# Patient Record
Sex: Female | Born: 1955 | Race: White | Hispanic: No | Marital: Single | Smoking: Current every day smoker
Health system: Southern US, Community
[De-identification: ages and names within clinical notes are randomized; demographics above are authoritative.]

## PROBLEM LIST (undated history)

## (undated) DIAGNOSIS — M71311 Other bursal cyst, right shoulder: Secondary | ICD-10-CM

## (undated) DIAGNOSIS — R319 Hematuria, unspecified: Secondary | ICD-10-CM

## (undated) DIAGNOSIS — N200 Calculus of kidney: Secondary | ICD-10-CM

## (undated) DIAGNOSIS — K635 Polyp of colon: Secondary | ICD-10-CM

## (undated) DIAGNOSIS — K759 Inflammatory liver disease, unspecified: Secondary | ICD-10-CM

## (undated) DIAGNOSIS — J45909 Unspecified asthma, uncomplicated: Secondary | ICD-10-CM

## (undated) DIAGNOSIS — E039 Hypothyroidism, unspecified: Secondary | ICD-10-CM

## (undated) DIAGNOSIS — IMO0002 Reserved for concepts with insufficient information to code with codable children: Secondary | ICD-10-CM

## (undated) DIAGNOSIS — K219 Gastro-esophageal reflux disease without esophagitis: Secondary | ICD-10-CM

## (undated) DIAGNOSIS — F419 Anxiety disorder, unspecified: Secondary | ICD-10-CM

## (undated) DIAGNOSIS — Z87442 Personal history of urinary calculi: Secondary | ICD-10-CM

## (undated) DIAGNOSIS — M1990 Unspecified osteoarthritis, unspecified site: Secondary | ICD-10-CM

## (undated) DIAGNOSIS — F32A Depression, unspecified: Secondary | ICD-10-CM

## (undated) DIAGNOSIS — G2581 Restless legs syndrome: Secondary | ICD-10-CM

## (undated) DIAGNOSIS — R51 Headache: Secondary | ICD-10-CM

## (undated) DIAGNOSIS — M109 Gout, unspecified: Secondary | ICD-10-CM

## (undated) DIAGNOSIS — F329 Major depressive disorder, single episode, unspecified: Secondary | ICD-10-CM

## (undated) DIAGNOSIS — R7611 Nonspecific reaction to tuberculin skin test without active tuberculosis: Secondary | ICD-10-CM

## (undated) DIAGNOSIS — J189 Pneumonia, unspecified organism: Secondary | ICD-10-CM

## (undated) DIAGNOSIS — E7800 Pure hypercholesterolemia, unspecified: Secondary | ICD-10-CM

## (undated) DIAGNOSIS — J449 Chronic obstructive pulmonary disease, unspecified: Secondary | ICD-10-CM

## (undated) DIAGNOSIS — K922 Gastrointestinal hemorrhage, unspecified: Secondary | ICD-10-CM

## (undated) DIAGNOSIS — M719 Bursopathy, unspecified: Secondary | ICD-10-CM

## (undated) DIAGNOSIS — K5792 Diverticulitis of intestine, part unspecified, without perforation or abscess without bleeding: Secondary | ICD-10-CM

## (undated) DIAGNOSIS — R0600 Dyspnea, unspecified: Secondary | ICD-10-CM

## (undated) HISTORY — DX: Other bursal cyst, right shoulder: M71.311

## (undated) HISTORY — DX: Restless legs syndrome: G25.81

## (undated) HISTORY — DX: Gout, unspecified: M10.9

## (undated) HISTORY — DX: Diverticulitis of intestine, part unspecified, without perforation or abscess without bleeding: K57.92

## (undated) HISTORY — PX: BREAST LUMPECTOMY: SHX2

## (undated) HISTORY — DX: Headache: R51

## (undated) HISTORY — PX: OTHER SURGICAL HISTORY: SHX169

## (undated) HISTORY — DX: Pure hypercholesterolemia, unspecified: E78.00

## (undated) HISTORY — DX: Hypothyroidism, unspecified: E03.9

## (undated) HISTORY — PX: TONSILLECTOMY: SUR1361

## (undated) HISTORY — DX: Nonspecific reaction to tuberculin skin test without active tuberculosis: R76.11

## (undated) HISTORY — PX: TUBAL LIGATION: SHX77

## (undated) HISTORY — DX: Polyp of colon: K63.5

## (undated) HISTORY — DX: Calculus of kidney: N20.0

## (undated) HISTORY — DX: Bursopathy, unspecified: M71.9

## (undated) HISTORY — DX: Gastro-esophageal reflux disease without esophagitis: K21.9

## (undated) HISTORY — DX: Reserved for concepts with insufficient information to code with codable children: IMO0002

---
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
## 2004-01-05 ENCOUNTER — Other Ambulatory Visit: Admission: RE | Admit: 2004-01-05 | Discharge: 2004-01-05 | Payer: Self-pay | Admitting: Family Medicine

## 2010-02-07 ENCOUNTER — Encounter: Admission: RE | Admit: 2010-02-07 | Discharge: 2010-02-07 | Payer: Self-pay | Admitting: Orthopedic Surgery

## 2010-05-05 ENCOUNTER — Ambulatory Visit (HOSPITAL_COMMUNITY): Admission: RE | Admit: 2010-05-05 | Discharge: 2010-05-05 | Payer: Self-pay | Admitting: Gastroenterology

## 2010-05-30 ENCOUNTER — Other Ambulatory Visit: Admission: RE | Admit: 2010-05-30 | Discharge: 2010-05-30 | Payer: Self-pay | Admitting: Obstetrics and Gynecology

## 2012-11-27 ENCOUNTER — Telehealth: Payer: Self-pay | Admitting: Family Medicine

## 2012-12-10 NOTE — Telephone Encounter (Signed)
Opened in error

## 2014-04-05 ENCOUNTER — Inpatient Hospital Stay (HOSPITAL_COMMUNITY)
Admission: EM | Admit: 2014-04-05 | Discharge: 2014-04-06 | DRG: 149 | Disposition: A | Discharge status: Home / Self Care | Payer: No Typology Code available for payment source | Attending: Internal Medicine | Admitting: Internal Medicine

## 2014-04-05 ENCOUNTER — Encounter (HOSPITAL_COMMUNITY): Payer: Self-pay | Admitting: Emergency Medicine

## 2014-04-05 ENCOUNTER — Emergency Department (HOSPITAL_COMMUNITY): Payer: No Typology Code available for payment source

## 2014-04-05 DIAGNOSIS — R319 Hematuria, unspecified: Secondary | ICD-10-CM | POA: Diagnosis present

## 2014-04-05 DIAGNOSIS — F411 Generalized anxiety disorder: Secondary | ICD-10-CM | POA: Diagnosis present

## 2014-04-05 DIAGNOSIS — H8303 Labyrinthitis, bilateral: Secondary | ICD-10-CM | POA: Diagnosis present

## 2014-04-05 DIAGNOSIS — I10 Essential (primary) hypertension: Secondary | ICD-10-CM | POA: Diagnosis present

## 2014-04-05 DIAGNOSIS — E785 Hyperlipidemia, unspecified: Secondary | ICD-10-CM | POA: Diagnosis present

## 2014-04-05 DIAGNOSIS — F329 Major depressive disorder, single episode, unspecified: Secondary | ICD-10-CM

## 2014-04-05 DIAGNOSIS — H8309 Labyrinthitis, unspecified ear: Principal | ICD-10-CM | POA: Diagnosis present

## 2014-04-05 DIAGNOSIS — H9319 Tinnitus, unspecified ear: Secondary | ICD-10-CM | POA: Diagnosis present

## 2014-04-05 DIAGNOSIS — F419 Anxiety disorder, unspecified: Secondary | ICD-10-CM | POA: Diagnosis present

## 2014-04-05 DIAGNOSIS — G43909 Migraine, unspecified, not intractable, without status migrainosus: Secondary | ICD-10-CM | POA: Diagnosis present

## 2014-04-05 DIAGNOSIS — F3289 Other specified depressive episodes: Secondary | ICD-10-CM | POA: Diagnosis present

## 2014-04-05 DIAGNOSIS — R42 Dizziness and giddiness: Secondary | ICD-10-CM | POA: Diagnosis present

## 2014-04-05 DIAGNOSIS — F17200 Nicotine dependence, unspecified, uncomplicated: Secondary | ICD-10-CM | POA: Diagnosis present

## 2014-04-05 DIAGNOSIS — Z860100 Personal history of colon polyps, unspecified: Secondary | ICD-10-CM

## 2014-04-05 DIAGNOSIS — Z87442 Personal history of urinary calculi: Secondary | ICD-10-CM | POA: Diagnosis present

## 2014-04-05 DIAGNOSIS — F32A Depression, unspecified: Secondary | ICD-10-CM | POA: Diagnosis present

## 2014-04-05 HISTORY — DX: Hematuria, unspecified: R31.9

## 2014-04-05 HISTORY — DX: Calculus of kidney: N20.0

## 2014-04-05 HISTORY — DX: Unspecified osteoarthritis, unspecified site: M19.90

## 2014-04-05 LAB — RAPID URINE DRUG SCREEN, HOSP PERFORMED
Amphetamines: NOT DETECTED
BENZODIAZEPINES: NOT DETECTED
Barbiturates: NOT DETECTED
Cocaine: NOT DETECTED
OPIATES: NOT DETECTED
TETRAHYDROCANNABINOL: NOT DETECTED

## 2014-04-05 LAB — URINALYSIS, ROUTINE W REFLEX MICROSCOPIC
BILIRUBIN URINE: NEGATIVE
Glucose, UA: NEGATIVE mg/dL
Ketones, ur: 40 mg/dL — AB
Leukocytes, UA: NEGATIVE
Nitrite: NEGATIVE
Protein, ur: NEGATIVE mg/dL
SPECIFIC GRAVITY, URINE: 1.011 (ref 1.005–1.030)
Urobilinogen, UA: 0.2 mg/dL (ref 0.0–1.0)
pH: 7.5 (ref 5.0–8.0)

## 2014-04-05 LAB — CBC WITH DIFFERENTIAL/PLATELET
Basophils Absolute: 0 10*3/uL (ref 0.0–0.1)
Basophils Relative: 0 % (ref 0–1)
EOS ABS: 0 10*3/uL (ref 0.0–0.7)
Eosinophils Relative: 0 % (ref 0–5)
HEMATOCRIT: 48.8 % — AB (ref 36.0–46.0)
Hemoglobin: 16.9 g/dL — ABNORMAL HIGH (ref 12.0–15.0)
LYMPHS ABS: 1.8 10*3/uL (ref 0.7–4.0)
LYMPHS PCT: 16 % (ref 12–46)
MCH: 33.3 pg (ref 26.0–34.0)
MCHC: 34.6 g/dL (ref 30.0–36.0)
MCV: 96.1 fL (ref 78.0–100.0)
MONOS PCT: 3 % (ref 3–12)
Monocytes Absolute: 0.4 10*3/uL (ref 0.1–1.0)
NEUTROS PCT: 81 % — AB (ref 43–77)
Neutro Abs: 9.5 10*3/uL — ABNORMAL HIGH (ref 1.7–7.7)
PLATELETS: 254 10*3/uL (ref 150–400)
RBC: 5.08 MIL/uL (ref 3.87–5.11)
RDW: 12.8 % (ref 11.5–15.5)
WBC: 11.7 10*3/uL — ABNORMAL HIGH (ref 4.0–10.5)

## 2014-04-05 LAB — BASIC METABOLIC PANEL
BUN: 10 mg/dL (ref 6–23)
CALCIUM: 9.9 mg/dL (ref 8.4–10.5)
CHLORIDE: 103 meq/L (ref 96–112)
CO2: 17 meq/L — AB (ref 19–32)
CREATININE: 0.69 mg/dL (ref 0.50–1.10)
GFR calc Af Amer: >90 mL/min (ref >90)
Glucose, Bld: 131 mg/dL — ABNORMAL HIGH (ref 70–99)
POTASSIUM: 3.9 meq/L (ref 3.7–5.3)
SODIUM: 142 meq/L (ref 137–147)

## 2014-04-05 LAB — URINE MICROSCOPIC-ADD ON

## 2014-04-05 LAB — CBG MONITORING, ED: Glucose-Capillary: 115 mg/dL — ABNORMAL HIGH (ref 70–99)

## 2014-04-05 LAB — ETHANOL

## 2014-04-05 MED ORDER — SODIUM CHLORIDE 0.9 % IV BOLUS (SEPSIS)
1000.0000 mL | Freq: Once | INTRAVENOUS | Status: AC
Start: 2014-04-05 — End: 2014-04-05
  Administered 2014-04-05: 1000 mL via INTRAVENOUS

## 2014-04-05 MED ORDER — PREDNISONE 20 MG PO TABS
60.0000 mg | ORAL_TABLET | Freq: Once | ORAL | Status: AC
  Administered 2014-04-05: 60 mg via ORAL
  Filled 2014-04-05: qty 3

## 2014-04-05 MED ORDER — DIAZEPAM 5 MG/ML IJ SOLN
5.0000 mg | Freq: Once | INTRAMUSCULAR | Status: AC
  Administered 2014-04-05: 5 mg via INTRAVENOUS
  Filled 2014-04-05: qty 2

## 2014-04-05 MED ORDER — ONDANSETRON HCL 4 MG/2ML IJ SOLN
4.0000 mg | Freq: Once | INTRAMUSCULAR | Status: AC
  Administered 2014-04-05: 4 mg via INTRAVENOUS
  Filled 2014-04-05: qty 2

## 2014-04-05 MED ORDER — MECLIZINE HCL 25 MG PO TABS
25.0000 mg | ORAL_TABLET | Freq: Once | ORAL | Status: AC
  Administered 2014-04-05: 25 mg via ORAL
  Filled 2014-04-05: qty 1

## 2014-04-05 NOTE — ED Notes (Signed)
Pt states that she is unable to provide urine at this time.

## 2014-04-05 NOTE — ED Notes (Signed)
Pt reports vertigo, N/V and tingling in both hands that started at 0100 last night

## 2014-04-05 NOTE — ED Provider Notes (Signed)
CSN: [id number]     Arrival date & time 04/05/14  1659 History   First MD Initiated Contact with Patient 04/05/14 1714     Chief Complaint  Patient presents with  . Dizziness  . Tingling     (Consider location/radiation/quality/duration/timing/severity/associated sxs/prior Treatment) HPI 58 year old female presents with acute dizziness/vertigo that started around midnight or 1 AM (17 hours ago). She first noticed it when she got up to go the bathroom she is having a hard time walking and felt dizzy. She ended up laying in the bathroom because she was hard time getting back up. She states the dizziness is hard to describe and is a constant feeling. It is worse with turning her head either direction, sitting up, or standing up. It is better with laying flat/still but does not go away. Denies any headaches. Denies a focal weakness. She states while she's in the waiting room she did feel bilateral hand tingling and numbness that has resolved. Denies a fevers or chills. She had vertigo once before about 30 years ago but has not had any issues since. She has been having bilateral tinnitus since this started.  Past Medical History  Diagnosis Date  . Arthritis   . Hematuria   . Renal stones    Past Surgical History  Procedure Laterality Date  . Tonsillectomy     Family History  Problem Relation Age of Onset  . Cancer Mother   . Cancer Father    History  Substance Use Topics  . Smoking status: Current Every Day Smoker -- 1.00 packs/day for 35 years  . Smokeless tobacco: Not on file  . Alcohol Use: 7.2 oz/week    12 Glasses of wine per week     Comment: per week   OB History   Grav Para Term Preterm Abortions TAB SAB Ect Mult Living                 Review of Systems  Constitutional: Negative for fever.  HENT: Positive for tinnitus.   Cardiovascular: Negative for chest pain.  Gastrointestinal: Positive for nausea and vomiting. Negative for abdominal pain.  Genitourinary: Positive  for hematuria (chronic hematuria). Negative for dysuria.  Neurological: Positive for dizziness and numbness (resolved). Negative for weakness and headaches.  All other systems reviewed and are negative.     Allergies  Advair diskus and Codeine  Home Medications   Prior to Admission medications   Medication Sig Start Date End Date Taking? Authorizing Provider  ALPRAZolam (XANAX) 0.25 MG tablet Take 0.25 mg by mouth 2 (two) times daily as needed for anxiety.   Yes Historical Provider, MD  atorvastatin (LIPITOR) 10 MG tablet Take 10 mg by mouth daily at 6 PM.   Yes Historical Provider, MD  escitalopram (LEXAPRO) 20 MG tablet Take 20 mg by mouth daily.   Yes Historical Provider, MD  omeprazole (PRILOSEC) 40 MG capsule Take 40 mg by mouth at bedtime.   Yes Historical Provider, MD  oxybutynin (DITROPAN-XL) 5 MG 24 hr tablet Take 10 mg by mouth at bedtime.   Yes Historical Provider, MD   BP 144/91  Pulse 97  Resp 26  SpO2 99% Physical Exam  Vitals reviewed. Constitutional: She is oriented to person, place, and time. She appears well-developed and well-nourished.  HENT:  Head: Normocephalic and atraumatic.  Right Ear: External ear normal.  Left Ear: External ear normal.  Nose: Nose normal.  Eyes: Pupils are equal, round, and reactive to light. Right eye exhibits no discharge. Left  eye exhibits no discharge. Right eye exhibits nystagmus. Left eye exhibits nystagmus.  Eyes able to move in all directions but with some mild nystagmus  Cardiovascular: Normal rate, regular rhythm and normal heart sounds.   Pulmonary/Chest: Effort normal and breath sounds normal.  Abdominal: Soft. There is no tenderness.  Neurological: She is alert and oriented to person, place, and time.  CN 2-12 grossly intact. 5/5 strength in all 4 extremities. Able to do finger to nose but with some shaking on the left side with more difficulty but is able to get from finger to nose. Normal heel to shin on right, slight  difficulty and shaking with left  Skin: Skin is warm and dry.    ED Course  Procedures (including critical care time) Labs Review Labs Reviewed  CBC WITH DIFFERENTIAL - Abnormal; Notable for the following:    WBC 11.7 (*)    Hemoglobin 16.9 (*)    HCT 48.8 (*)    Neutrophils Relative % 81 (*)    Neutro Abs 9.5 (*)    All other components within normal limits  BASIC METABOLIC PANEL - Abnormal; Notable for the following:    CO2 17 (*)    Glucose, Bld 131 (*)    All other components within normal limits  URINALYSIS, ROUTINE W REFLEX MICROSCOPIC - Abnormal; Notable for the following:    Hgb urine dipstick MODERATE (*)    Ketones, ur 40 (*)    All other components within normal limits  CBG MONITORING, ED - Abnormal; Notable for the following:    Glucose-Capillary 115 (*)    All other components within normal limits  URINE MICROSCOPIC-ADD ON    Imaging Review Mr Brain Wo Contrast  04/05/2014   CLINICAL DATA:  New onset of vertigo, nausea and vomiting. Bilateral tingling in hands with difficulty ambulating.  EXAM: MRI HEAD WITHOUT CONTRAST  TECHNIQUE: Multiplanar, multiecho pulse sequences of the brain and surrounding structures were obtained without intravenous contrast.  COMPARISON:  None.  FINDINGS: No acute infarct.  No intracranial hemorrhage.  No hydrocephalus.  No intracranial mass lesion noted on this unenhanced exam.  Tortuous vertebral arteries and basilar artery with basilar artery cause mild impression upon the hypothalamic region. Major intracranial vascular structures are patent.  Minimal opacification lateral aspect left sphenoid sinus.  Mild exophthalmos.  IMPRESSION: No acute infarct.  Please see above.   Electronically Signed   By: Chauncey Cruel M.D.   On: 04/05/2014 19:57     EKG Interpretation   Date/Time:  Sunday Apr 05 2014 17:07:15 EDT Ventricular Rate:  86 PR Interval:  132 QRS Duration: 100 QT Interval:  424 QTC Calculation: 507 R Axis:   39 Text  Interpretation:  Sinus rhythm `no acute st/t changes No previous  tracing Confirmed by Ashok Cordia  MD, Lennette Bihari ([id number]) on 04/05/2014 5:09:18 PM      MDM   Final diagnoses:  Vertigo    Patient's MRI is negative for acute stroke. She was given meclizine and Valium and is still having perpetual symptoms. She is unable to stand up due to the dizziness. I feel she is not safe to go home. I counseled Dr. Leonel Ramsay, of neurology, who recommends prednisone taper as this sounds like labyrinthitis with her ear ringing and no evidence of stroke. He is recommending 60 mg prednisone for 5 days, then tapering with 40, 30, 20, 10, 5 for 1 day each. Will admit to hospitalist.    Ephraim Hamburger, MD 04/05/14 2213

## 2014-04-05 NOTE — H&P (Signed)
[facility] History and Physical  [name] MWN:[id number] DOB: 01-24-1956 DOA: 04/05/2014  Referring physician:PCP: No primary provider on file.  Specialists:   Chief Complaint: Vertigo  HPI: Andrea Kline is a 58 y.o. WF PMHx anxiety, depression, HLD, chronic hematuria, renal stones, colon polyps.   Presents with acute dizziness/vertigo that started around midnight or 1 AM (17 hours ago). She first noticed it when she got up to go the bathroom she is having a hard time walking and felt dizzy (room spinning). Positive nausea/vomiting, positive diaphoresis, negative recent illness. Positive tinnitus Rt> Lt. She ended up laying in the bathroom because she was hard time getting back up. She states the dizziness is hard to describe and is a constant feeling. It is worse with turning her head either direction, sitting up, or standing up. It is better with laying flat/still but does not go away. Positive headaches. Denies a focal weakness. She states while she's in the waiting room she did feel bilateral hand tingling and numbness that has resolved. Denies a fevers or chills. She had vertigo once before about 30 years ago but has not had any issues since. She has been having bilateral tinnitus since this started.   Review of Systems: The patient denies anorexia, fever, weight loss,, vision loss, hoarseness, chest pain, syncope, dyspnea on exertion, peripheral edema, hemoptysis, abdominal pain, melena, hematochezia, incontinence, genital sores, muscle weakness, suspicious skin lesions, transient blindness, usual weight change, abnormal bleeding, enlarged lymph nodes, angioedema, and breast masses.    TRAVEL HISTORY: N/A    Consultants:  Dr. Leonel Ramsay (neurology), ED physician phone consult    Procedure/Significant Events:  5/30 MRI brain without contrast; no acute infarct    Culture  5/31 urine pending 5/31 blood pending   Antibiotics:  NA    DVT prophylaxis:   Lovenox  Devices  NA    LINES / TUBES:  5/31 22ga left antecubital   Past Medical History  Diagnosis Date  . Arthritis   . Hematuria   . Renal stones    Past Surgical History  Procedure Laterality Date  . Tonsillectomy     Social History:  1 PPD x35 years. She does not have any smokeless tobacco history on file. She reports that she drinks about 7.2 ounces of alcohol per week. She reports that she does not use illicit drugs since 4034V. where does patient live--home, ALF, SNF? Lives alone  Can patient participate in ADLs?  Allergies  Allergen Reactions  . Advair Diskus [Fluticasone-Salmeterol]     Can't breath/coughing  . Codeine     dizzy    Family History  Problem Relation Age of Onset  . Cancer Mother   . Cancer Father      Prior to Admission medications   Medication Sig Start Date End Date Taking? Authorizing Provider  ALPRAZolam (XANAX) 0.25 MG tablet Take 0.25 mg by mouth 2 (two) times daily as needed for anxiety.   Yes Historical Provider, MD  atorvastatin (LIPITOR) 10 MG tablet Take 10 mg by mouth daily at 6 PM.   Yes Historical Provider, MD  escitalopram (LEXAPRO) 20 MG tablet Take 20 mg by mouth daily.   Yes Historical Provider, MD  omeprazole (PRILOSEC) 40 MG capsule Take 40 mg by mouth at bedtime.   Yes Historical Provider, MD  oxybutynin (DITROPAN-XL) 5 MG 24 hr tablet Take 10 mg by mouth at bedtime.   Yes Historical Provider, MD   Physical Exam: Filed Vitals:   04/05/14 2200 04/05/14 2203  04/05/14 2323 04/05/14 2334  BP: 143/83 143/83 175/101 145/87  Pulse: 85 95 90   Temp:   98.2 F (36.8 C)   TempSrc:   Oral   Resp:  16 22   Height:   5\' 5"  (1.651 m)   Weight:   84.188 kg (185 lb 9.6 oz)   SpO2: 90% 98% 97%      General: A./O. x4, moderate distress secondary to vertigo  Eyes: PERRLA, bilateral Xanthelasma present   Neck: Negative lymphadenopathy negative JVD  Cardiovascular: Regular rhythm and rate, negative murmurs rubs or  gallops  Respiratory: Clear to auscultation bilateral  Abdomen: Soft, nontender, nondistended, plus bowel sound  Skin: Negative laceration  Musculoskeletal: Negative pedal edema  Psychiatric: Anxious  Neurologic: Cranial nerves II through XII intact, negative nystagmus, tongue/uvula midline, extremity strength 5/5, sensation intact throughout, finger-nose-finger within normal limits bilateral, quick finger touch is within normal limit bilateral, did not attempt to ambulate patient as vertigo significantly increased with sitting   Labs on Admission:  Basic Metabolic Panel:  Recent Labs Lab 04/05/14 1745  NA 142  K 3.9  CL 103  CO2 17*  GLUCOSE 131*  BUN 10  CREATININE 0.69  CALCIUM 9.9   Liver Function Tests: No results found for this basename: AST, ALT, ALKPHOS, BILITOT, PROT, ALBUMIN,  in the last 168 hours No results found for this basename: LIPASE, AMYLASE,  in the last 168 hours No results found for this basename: AMMONIA,  in the last 168 hours CBC:  Recent Labs Lab 04/05/14 1745  WBC 11.7*  NEUTROABS 9.5*  HGB 16.9*  HCT 48.8*  MCV 96.1  PLT 254   Cardiac Enzymes: No results found for this basename: CKTOTAL, CKMB, CKMBINDEX, TROPONINI,  in the last 168 hours  BNP (last 3 results) No results found for this basename: PROBNP,  in the last 8760 hours CBG:  Recent Labs Lab 04/05/14 1742  GLUCAP 115*    Radiological Exams on Admission: Mr Brain Wo Contrast  04/05/2014   CLINICAL DATA:  New onset of vertigo, nausea and vomiting. Bilateral tingling in hands with difficulty ambulating.  EXAM: MRI HEAD WITHOUT CONTRAST  TECHNIQUE: Multiplanar, multiecho pulse sequences of the brain and surrounding structures were obtained without intravenous contrast.  COMPARISON:  None.  FINDINGS: No acute infarct.  No intracranial hemorrhage.  No hydrocephalus.  No intracranial mass lesion noted on this unenhanced exam.  Tortuous vertebral arteries and basilar artery with  basilar artery cause mild impression upon the hypothalamic region. Major intracranial vascular structures are patent.  Minimal opacification lateral aspect left sphenoid sinus.  Mild exophthalmos.  IMPRESSION: No acute infarct.  Please see above.   Electronically Signed   By: Chauncey Cruel M.D.   On: 04/05/2014 19:57    EKG: NSR  Assessment/Plan Principal Problem:   Labyrinthitis of both ears Active Problems:   Vertigo   Anxiety   Depression   HLD (hyperlipidemia)   Hematuria   History of nephrolithiasis   History of colon polyps   Migraine, unspecified, without mention of intractable migraine without mention of status migrainosus   Labyrinthitis -Negative MRI -Dr. Sherwood Gambler (ED physician) spoke with Dr. Leonel Ramsay (neurologist) over the phone who believes the patient has labyrinthitis and should be started on the following treatment regimen; 60 mg daily on days one through five, 40 mg on day six, 30 mg on day seven, 20 mg on day eight, 10 mg on day nine, and 5 mg on day 10. -Dr. Leonel Ramsay stated  if formal consult required, contact him in the a.m.  -Continue Zofran when necessary for nausea  Migraine -Patient may also have atypical presentation of migraine which can present with severe vertigo. Patient currently has headache will treat with Imitrex 6 mg injection x1  Anxiety -Continue home dose Xanax 0.25 mg BID PRN   Depression -Continue home dose Lexapro 20 mg daily  HLD -Patient with Xanthelasma present obtain lipid panel -Continue Lipitor 10 mg daily  Hematuria -Chronic no workup required   Code Status: Full Family Communication: None Disposition Plan: Resolution vertigo  Time spent: 90 minutes  [facility] Pager [phone]  If 7PM-7AM, please contact night-coverage [email or web address] Password [email or web address] 04/06/2014, 1:05 AM

## 2014-04-06 DIAGNOSIS — H8303 Labyrinthitis, bilateral: Secondary | ICD-10-CM | POA: Diagnosis present

## 2014-04-06 DIAGNOSIS — G43909 Migraine, unspecified, not intractable, without status migrainosus: Secondary | ICD-10-CM | POA: Diagnosis present

## 2014-04-06 LAB — LIPID PANEL
Cholesterol: 172 mg/dL (ref 0–200)
HDL: 38 mg/dL — ABNORMAL LOW (ref >39)
LDL CALC: 108 mg/dL — AB (ref 0–99)
TRIGLYCERIDES: 132 mg/dL (ref <150)
Total CHOL/HDL Ratio: 4.5 RATIO
VLDL: 26 mg/dL (ref 0–40)

## 2014-04-06 LAB — COMPREHENSIVE METABOLIC PANEL
ALT: 15 U/L (ref 0–35)
AST: 13 U/L (ref 0–37)
Albumin: 3.6 g/dL (ref 3.5–5.2)
Alkaline Phosphatase: 147 U/L — ABNORMAL HIGH (ref 39–117)
BUN: 8 mg/dL (ref 6–23)
CALCIUM: 9.4 mg/dL (ref 8.4–10.5)
CO2: 23 mEq/L (ref 19–32)
CREATININE: 0.69 mg/dL (ref 0.50–1.10)
Chloride: 107 mEq/L (ref 96–112)
GLUCOSE: 133 mg/dL — AB (ref 70–99)
Potassium: 4.2 mEq/L (ref 3.7–5.3)
Sodium: 142 mEq/L (ref 137–147)
Total Bilirubin: 0.3 mg/dL (ref 0.3–1.2)
Total Protein: 6.8 g/dL (ref 6.0–8.3)

## 2014-04-06 LAB — URINALYSIS, ROUTINE W REFLEX MICROSCOPIC
BILIRUBIN URINE: NEGATIVE
Glucose, UA: NEGATIVE mg/dL
KETONES UR: NEGATIVE mg/dL
Leukocytes, UA: NEGATIVE
NITRITE: NEGATIVE
PROTEIN: NEGATIVE mg/dL
Specific Gravity, Urine: 1.005 (ref 1.005–1.030)
UROBILINOGEN UA: 0.2 mg/dL (ref 0.0–1.0)
pH: 6 (ref 5.0–8.0)

## 2014-04-06 LAB — CBC WITH DIFFERENTIAL/PLATELET
Basophils Absolute: 0 10*3/uL (ref 0.0–0.1)
Basophils Relative: 0 % (ref 0–1)
Eosinophils Absolute: 0 10*3/uL (ref 0.0–0.7)
Eosinophils Relative: 0 % (ref 0–5)
HCT: 46.3 % — ABNORMAL HIGH (ref 36.0–46.0)
Hemoglobin: 15.5 g/dL — ABNORMAL HIGH (ref 12.0–15.0)
LYMPHS ABS: 0.8 10*3/uL (ref 0.7–4.0)
LYMPHS PCT: 11 % — AB (ref 12–46)
MCH: 33 pg (ref 26.0–34.0)
MCHC: 33.5 g/dL (ref 30.0–36.0)
MCV: 98.5 fL (ref 78.0–100.0)
Monocytes Absolute: 0.1 10*3/uL (ref 0.1–1.0)
Monocytes Relative: 1 % — ABNORMAL LOW (ref 3–12)
NEUTROS PCT: 88 % — AB (ref 43–77)
Neutro Abs: 6.5 10*3/uL (ref 1.7–7.7)
PLATELETS: 233 10*3/uL (ref 150–400)
RBC: 4.7 MIL/uL (ref 3.87–5.11)
RDW: 12.9 % (ref 11.5–15.5)
WBC: 7.4 10*3/uL (ref 4.0–10.5)

## 2014-04-06 LAB — MAGNESIUM: Magnesium: 2.2 mg/dL (ref 1.5–2.5)

## 2014-04-06 LAB — TSH: TSH: 1.19 u[IU]/mL (ref 0.350–4.500)

## 2014-04-06 LAB — URINE MICROSCOPIC-ADD ON

## 2014-04-06 MED ORDER — ESCITALOPRAM OXALATE 20 MG PO TABS
20.0000 mg | ORAL_TABLET | Freq: Every day | ORAL | Status: DC
  Administered 2014-04-06: 20 mg via ORAL
  Filled 2014-04-06: qty 1

## 2014-04-06 MED ORDER — PANTOPRAZOLE SODIUM 40 MG PO TBEC
80.0000 mg | DELAYED_RELEASE_TABLET | Freq: Every day | ORAL | Status: DC
  Administered 2014-04-06: 80 mg via ORAL
  Filled 2014-04-06: qty 2

## 2014-04-06 MED ORDER — PREDNISONE 50 MG PO TABS
60.0000 mg | ORAL_TABLET | Freq: Every day | ORAL | Status: DC
  Administered 2014-04-06: 60 mg via ORAL
  Filled 2014-04-06: qty 1

## 2014-04-06 MED ORDER — ONDANSETRON HCL 4 MG PO TABS: 4.0000 mg | ORAL_TABLET | Freq: Four times a day (QID) | ORAL | Status: DC | PRN

## 2014-04-06 MED ORDER — ATORVASTATIN CALCIUM 10 MG PO TABS
10.0000 mg | ORAL_TABLET | Freq: Every day | ORAL | Status: DC
  Administered 2014-04-06: 10 mg via ORAL
  Filled 2014-04-06: qty 1

## 2014-04-06 MED ORDER — ALPRAZOLAM 0.25 MG PO TABS: 0.2500 mg | ORAL_TABLET | Freq: Two times a day (BID) | ORAL | Status: DC | PRN

## 2014-04-06 MED ORDER — ACETAMINOPHEN 650 MG RE SUPP: 650.0000 mg | Freq: Four times a day (QID) | RECTAL | Status: DC | PRN

## 2014-04-06 MED ORDER — ONDANSETRON HCL 4 MG/2ML IJ SOLN: 4.0000 mg | Freq: Four times a day (QID) | INTRAMUSCULAR | Status: DC | PRN

## 2014-04-06 MED ORDER — SUMATRIPTAN SUCCINATE 6 MG/0.5ML ~~LOC~~ SOLN
6.0000 mg | Freq: Once | SUBCUTANEOUS | Status: AC
  Administered 2014-04-06: 6 mg via SUBCUTANEOUS
  Filled 2014-04-06: qty 0.5

## 2014-04-06 MED ORDER — ACETAMINOPHEN 325 MG PO TABS: 650.0000 mg | ORAL_TABLET | Freq: Four times a day (QID) | ORAL | Status: DC | PRN

## 2014-04-06 MED ORDER — ENSURE COMPLETE PO LIQD
237.0000 mL | Freq: Two times a day (BID) | ORAL | Status: DC
  Administered 2014-04-06: 237 mL via ORAL

## 2014-04-06 MED ORDER — SODIUM CHLORIDE 0.9 % IV SOLN
INTRAVENOUS | Status: DC
  Administered 2014-04-06: 02:00:00 via INTRAVENOUS

## 2014-04-06 MED ORDER — PREDNISONE 10 MG PO TABS: ORAL_TABLET | ORAL | Status: DC

## 2014-04-06 MED ORDER — OXYBUTYNIN CHLORIDE ER 10 MG PO TB24
10.0000 mg | ORAL_TABLET | Freq: Every day | ORAL | Status: DC
  Administered 2014-04-06: 10 mg via ORAL
  Filled 2014-04-06 (×2): qty 1

## 2014-04-06 MED ORDER — ENOXAPARIN SODIUM 40 MG/0.4ML ~~LOC~~ SOLN
40.0000 mg | SUBCUTANEOUS | Status: DC
  Administered 2014-04-06: 40 mg via SUBCUTANEOUS
  Filled 2014-04-06: qty 0.4

## 2014-04-06 NOTE — Evaluation (Signed)
Physical Therapy One Time Evaluation Patient Details Name: Andrea Kline MRN: [id number] DOB: 08/16/1956 Today's Date: 04/06/2014   History of Present Illness  Pt is a 58 year old female admitted 5/31 for acute dizziness/vertigo and diagnosed with labyrinthitis of both ears.  Clinical Impression  Patient evaluated by Physical Therapy with no further acute PT needs identified. All education has been completed and the patient has no further questions. Recommend follow-up Physial Therapy at outpatient neurorehab if symptoms persist. PT is signing off. Thank you for this referral.  Vestibular Assessment: Pt reports dizziness started day of admission and has been constant however worse with mobility.  Pt reports feeling much better today.  Pt reports tinnitus in R ear, unable to recall if this occurred in L ear as well.  Saccades, smooth pursuits and VOR cancellation all negative.  Pt reports dizziness with head shaking (vertical and horizontal) and dizziness after performing VOR and head thrusts.  No nystagmus observed throughout testing.  Pt reports 8/10 dizziness with changing positions and educated to wait for dizziness to improve prior to next change in position.  Pt reports 2/10 dizziness with ambulation.  Pt given 2 eye exercises handouts (copies placed in shadow chart), reviewed and demonstrated with pt, pt reports understanding.  Pt also given OP Neurorehab referral form in case symptoms persists.  Pt requested RW at home for safety (has cats).  Pt had no further questions/concerns and hopes to d/c home today.     Follow Up Recommendations Outpatient PT (Outpatient neurorehab if symptoms persist)    Equipment Recommendations  Rolling walker with 5" wheels    Recommendations for Other Services       Precautions / Restrictions Precautions Precautions: Fall      Mobility  Bed Mobility Overal bed mobility: Modified Independent             General bed mobility comments: increased  time however due to dizziness, reported 8/10 dizziness  Transfers Overall transfer level: Needs assistance Equipment used: None Transfers: Sit to/from Stand Sit to Stand: Min guard         General transfer comment: min/guard for safety  Ambulation/Gait Ambulation/Gait assistance: Min guard Ambulation Distance (Feet): 350 Feet Assistive device: None Gait Pattern/deviations: Decreased stride length;Wide base of support     General Gait Details: pt reports 2/10 dizziness with ambulation, eventually able to look around environment and reports still 2/10 dizziness, pt educated to look at stationary object if she dizziness increases, slow cautious gait, pt pleased she is able to ambulate with very little dizziness  Stairs            Wheelchair Mobility    Modified Rankin (Stroke Patients Only)       Balance                                             Pertinent Vitals/Pain No pain, activity to tolerance     Home Living Family/patient expects to be discharged to:: Private residence Living Arrangements: Alone   Type of Home: House       Home Layout: One level Home Equipment: None      Prior Function Level of Independence: Independent               Hand Dominance        Extremity/Trunk Assessment   Upper Extremity Assessment: Overall WFL for tasks  assessed           Lower Extremity Assessment: Overall WFL for tasks assessed         Communication   Communication: No difficulties  Cognition Arousal/Alertness: Awake/alert Behavior During Therapy: WFL for tasks assessed/performed Overall Cognitive Status: Within Functional Limits for tasks assessed                      General Comments      Exercises        Assessment/Plan    PT Assessment All further PT needs can be met in the next venue of care  PT Diagnosis Difficulty walking   PT Problem List Decreased mobility;Other (comment) (Labyrinthitis,  dizziness with mobility)  PT Treatment Interventions     PT Goals (Current goals can be found in the Care Plan section) Acute Rehab PT Goals PT Goal Formulation: No goals set, d/c therapy    Frequency     Barriers to discharge        Co-evaluation               End of Session Equipment Utilized During Treatment: Gait belt Activity Tolerance: Patient tolerated treatment well Patient left: in bed;with nursing/sitter in room;with call bell/phone within reach Nurse Communication: Mobility status         Time: 5396-7289 PT Time Calculation (min): 15 min   Charges:   PT Evaluation $Initial PT Evaluation Tier I: 1 Procedure PT Treatments $Gait Training: 8-22 mins   PT G CodesJunius Argyle 04/06/2014, 3:56 PM Carmelia Bake, PT, DPT 04/06/2014 Pager: [phone]

## 2014-04-06 NOTE — Progress Notes (Signed)
Physician on call paged regarding patient orthostatic vital signs. Patient resting in bed. Stated vertigo had slightly improved when NT and RN helped her stand to get orthostatics. Will continue to monitor.

## 2014-04-06 NOTE — Progress Notes (Signed)
Paged Dr. Doyle Askew about PT recommendations and orthostatic BP results.

## 2014-04-06 NOTE — Discharge Summary (Signed)
Physician Discharge Summary  Andrea Kline STM:[id number] DOB: Jun 17, 1956 DOA: 04/05/2014  PCP: No primary provider on file.  Admit date: 04/05/2014 Discharge date: 04/06/2014  Recommendations for Outpatient Follow-up:  1. Pt will need to follow up with PCP in 2-3 weeks post discharge 2. Please obtain BMP to evaluate electrolytes and kidney function 3. Please also check CBC to evaluate Hg and Hct levels 4. Please note that pt refused starting antihypertensive regimen 5. Pt to continue prednisone taper as noted below per neurology recommendations   Discharge Diagnoses:  Principal Problem:   Labyrinthitis of both ears Active Problems:   Vertigo   Anxiety   Depression   HLD (hyperlipidemia)   Hematuria   History of nephrolithiasis   History of colon polyps   Migraine, unspecified, without mention of intractable migraine without mention of status migrainosus  Discharge Condition: Stable  Diet recommendation: Heart healthy diet discussed in details   History of present illness:  58 y.o. WF PMHx anxiety, depression, HLD, chronic hematuria, renal stones, colon polyps, presented with acute dizziness/vertigo that started one day prior to this admission. She first noticed it when she got up to go the bathroom as she was having a hard time walking and felt dizzy (room spinning). Positive nausea/vomiting, positive diaphoresis, negative recent illness. Positive tinnitus Rt> Lt. She ended up laying in the bathroom because she had hard time getting back up. She stated the dizziness is hard to describe and is a constant feeling. It is worse with turning her head either direction, sitting up, or standing up. It is better with laying flat/still but does not go away. She had vertigo once before about 30 years ago but has not had any issues since. She has been having bilateral tinnitus since this started.   Hospital Course:  Principal Problem:   Labyrinthitis of both ears - better this AM - vestibular  therapy to continue upon discharge - referral provided  - pt started on Prednisone and appears to be responding well  - she will continue Prednisone taper as noted below per neurology recommendations  Active Problems:   Depression - clinically stable    HTN - discussed placing on antihypertensive regimen - pt refusing at this time but aware she needs to monitor BP   Procedures/Studies: Mr Brain Wo Contrast  04/05/2014    No acute infarct.   Consultations:  Neurology   Antibiotics:  None   Discharge Exam: Filed Vitals:   04/06/14 1318  BP: 150/90  Pulse: 80  Temp: 98.2 F (36.8 C)  Resp: 18   Filed Vitals:   04/06/14 0157 04/06/14 0246 04/06/14 0528 04/06/14 1318  BP: 159/94 149/87 151/95 150/90  Pulse: 91 76 85 80  Temp:   97.7 F (36.5 C) 98.2 F (36.8 C)  TempSrc:   Oral Oral  Resp:   16 18  Height:      Weight:      SpO2:   96% 97%    General: Pt is alert, follows commands appropriately, not in acute distress Cardiovascular: Regular rate and rhythm, S1/S2 +, no murmurs, no rubs, no gallops Respiratory: Clear to auscultation bilaterally, no wheezing, no crackles, no rhonchi Abdominal: Soft, non tender, non distended, bowel sounds +, no guarding Extremities: no edema, no cyanosis, pulses palpable bilaterally DP and PT Neuro: Grossly nonfocal  Discharge Instructions  Discharge Instructions   Diet - low sodium heart healthy    Complete by:  As directed      Increase activity slowly  Complete by:  As directed             Medication List         ALPRAZolam 0.25 MG tablet  Commonly known as:  XANAX  Take 0.25 mg by mouth 2 (two) times daily as needed for anxiety.     atorvastatin 10 MG tablet  Commonly known as:  LIPITOR  Take 10 mg by mouth daily at 6 PM.     escitalopram 20 MG tablet  Commonly known as:  LEXAPRO  Take 20 mg by mouth daily.     omeprazole 40 MG capsule  Commonly known as:  PRILOSEC  Take 40 mg by mouth at bedtime.      ondansetron 4 MG tablet  Commonly known as:  ZOFRAN  Take 1 tablet (4 mg total) by mouth every 6 (six) hours as needed for nausea.     oxybutynin 5 MG 24 hr tablet  Commonly known as:  DITROPAN-XL  Take 10 mg by mouth at bedtime.     predniSONE 10 MG tablet  Commonly known as:  DELTASONE  Take 60 mg tablet daily for total five days and continue to taper down by 10 mg daily until completed           Follow-up Information   Follow up with Faye Ramsay, MD. (As needed, If symptoms worsen, call my cell phone [phone] )    Specialty:  Internal Medicine   Contact information:   [address] [place] [address] [phone]        The results of significant diagnostics from this hospitalization (including imaging, microbiology, ancillary and laboratory) are listed below for reference.     Microbiology: No results found for this or any previous visit (from the past 240 hour(s)).   Labs: Basic Metabolic Panel:  Recent Labs Lab 04/05/14 1745 04/06/14 0524  NA 142 142  K 3.9 4.2  CL 103 107  CO2 17* 23  GLUCOSE 131* 133*  BUN 10 8  CREATININE 0.69 0.69  CALCIUM 9.9 9.4  MG  --  2.2   Liver Function Tests:  Recent Labs Lab 04/06/14 0524  AST 13  ALT 15  ALKPHOS 147*  BILITOT 0.3  PROT 6.8  ALBUMIN 3.6   CBC:  Recent Labs Lab 04/05/14 1745 04/06/14 0524  WBC 11.7* 7.4  NEUTROABS 9.5* 6.5  HGB 16.9* 15.5*  HCT 48.8* 46.3*  MCV 96.1 98.5  PLT 254 233   CBG:  Recent Labs Lab 04/05/14 1742  GLUCAP 115*     SIGNED: Time coordinating discharge: Over 30 minutes  Theodis Blaze, MD  [facility] 04/06/2014, 4:19 PM Pager [phone]  If 7PM-7AM, please contact night-coverage [email or web address] Password [email or web address]

## 2014-04-06 NOTE — Discharge Instructions (Signed)
Labyrinthitis (Inner Ear Inflammation) Your exam shows you have an inner ear disturbance or labyrinthitis. The cause of this condition is not known. But it may be due to a virus infection. The symptoms of labyrinthitis include vertigo or dizziness made worse by motion, nausea and vomiting. The onset of labyrinthitis may be very sudden. It usually lasts for a few days and then clears up over 1-2 weeks. The treatment of an inner ear disturbance includes bed rest and medications to reduce dizziness, nausea, and vomiting. You should stay away from alcohol, tranquilizers, caffeine, nicotine, or any medicine your doctor thinks may make your symptoms worse. Further testing may be needed to evaluate your hearing and balance system. Please see your doctor or go to the emergency room right away if you have:  Increasing vertigo, earache, loss of hearing, or ear drainage.  Headache, blurred vision, trouble walking, fainting, or fever.  Persistent vomiting, dehydration, or extreme weakness. Document Released: 10/23/2005 Document Revised: 01/15/2012 Document Reviewed: 04/10/2007 [facility] Patient Information 2014 [place].

## 2014-04-06 NOTE — Progress Notes (Signed)
INITIAL NUTRITION ASSESSMENT  DOCUMENTATION CODES Per approved criteria  -Obesity Unspecified   INTERVENTION: -Ensure Complete BID, each provides 350 kcal, 13 g protein -Encourage good PO intake  NUTRITION DIAGNOSIS: Inadequate oral intake related to decreased appetite as evidenced by 60% intake of meals.   Goal: Patient will meet >/=90% of estimated nutrition needs  Monitor:  PO intake, weight, labs  Reason for Assessment: Malnutrition screening tool  58 y.o. female  Admitting Dx: Labyrinthitis of both ears  ASSESSMENT: 58 year old female admitted with dizziness and tinnitus.   She reports that she has not eaten much since Saturday due to vertigo. Her diet was just advanced to Regular this afternoon, and she ate 60% of her lunch. She does state that her appetite is still fair, but she is eating because she should. She denies weight loss.   Height: Ht Readings from Last 1 Encounters:  04/05/14 5\' 5"  (1.651 m)    Weight: Wt Readings from Last 1 Encounters:  04/05/14 185 lb 9.6 oz (84.188 kg)    Ideal Body Weight: 125 pounds  % Ideal Body Weight: 148%  Wt Readings from Last 10 Encounters:  04/05/14 185 lb 9.6 oz (84.188 kg)    Usual Body Weight: 185 pounds  % Usual Body Weight: 100%  BMI:  Body mass index is 30.89 kg/(m^2). Patient is obese, class I  Estimated Nutritional Needs: Kcal: 1700-1850 kcal Protein: 85-100 g Fluid: 2.8-3.0 L/day  Skin: Intact  Diet Order: General  EDUCATION NEEDS: -No education needs identified at this time   Intake/Output Summary (Last 24 hours) at 04/06/14 1521 Last data filed at 04/06/14 1352  Gross per 24 hour  Intake 666.67 ml  Output    400 ml  Net 266.67 ml    Last BM: PTA   Labs:   Recent Labs Lab 04/05/14 1745 04/06/14 0524  NA 142 142  K 3.9 4.2  CL 103 107  CO2 17* 23  BUN 10 8  CREATININE 0.69 0.69  CALCIUM 9.9 9.4  MG  --  2.2  GLUCOSE 131* 133*    CBG (last 3)   Recent Labs  04/05/14 1742  GLUCAP 115*    Scheduled Meds: . atorvastatin  10 mg Oral q1800  . enoxaparin (LOVENOX) injection  40 mg Subcutaneous Q24H  . escitalopram  20 mg Oral Daily  . oxybutynin  10 mg Oral QHS  . pantoprazole  80 mg Oral Daily  . predniSONE  60 mg Oral Daily    Continuous Infusions: . sodium chloride 100 mL/hr at 04/06/14 0155    Past Medical History  Diagnosis Date  . Arthritis   . Hematuria   . Renal stones     Past Surgical History  Procedure Laterality Date  . Tonsillectomy      Larey Seat, RD, LDN Pager #: [phone] After-Hours Pager #: [phone]

## 2014-04-07 NOTE — Care Management Note (Unsigned)
    Page 1 of 1   04/07/2014     2:41:06 PM CARE MANAGEMENT NOTE 04/07/2014  Patient:  Andrea Kline, Andrea Kline   Account Number:  [id number]  Date Initiated:  04/07/2014  Documentation initiated by:  [facility]  Subjective/Objective Assessment:   58 year old female admitted with vertigo.     Action/Plan:   From home. MD ordered Neurorehab, vestibular therapy at d/c.   Anticipated DC Date:  04/06/2014   Anticipated DC Plan:  [place]  CM consult      Choice offered to / List presented to:             Status of service:  Completed, signed off Medicare Important Message given?  NO (If response is "NO", the following Medicare IM given date fields will be blank) Date Medicare IM given:   Date Additional Medicare IM given:    Discharge Disposition:  HOME/SELF CARE  Per UR Regulation:  Reviewed for med. necessity/level of care/duration of stay  If discussed at [place] of Stay Meetings, dates discussed:    Comments:  04/06/14 Allene Dillon RN BSN [phone] Pt d/c'd after hours on 6/1. Noted orders for RW and Neurorehab, vestibular therapy. I called pt to follow up on this. She stated she does not need a RW and that she is doing exercises at home for her vertigo and does not need neuro-rehab right now. Pt asked about Dr Doyle Askew writing a work note for her as well as signing her FMLA paperwork. I spoke with Dr Doyle Askew and she will complete the note and it will be faxed to [phone]. Pt will have the FMLA paper work faxed to [place] and Dr Doyle Askew will pick it up from there and complete it.

## 2014-04-08 LAB — URINE CULTURE: Colony Count: 80000

## 2014-04-20 ENCOUNTER — Telehealth: Payer: Self-pay | Admitting: Internal Medicine

## 2014-04-20 NOTE — Telephone Encounter (Signed)
Pt. Wants to come in for a f/u appt...as of right now at [facility] we don't have any open slots for new patient appointment..patient asked if you could please call her at [phone]

## 2014-04-21 ENCOUNTER — Telehealth: Payer: Self-pay | Admitting: Internal Medicine

## 2014-04-21 NOTE — Telephone Encounter (Signed)
Pt calling regarding FMLA paperwork, says she was seen at [facility] yesterday and her employer is still waiting for FMLA paperwork and might lose her job if they do not receive soon.

## 2014-04-22 ENCOUNTER — Telehealth: Payer: Self-pay | Admitting: Emergency Medicine

## 2014-04-22 NOTE — Telephone Encounter (Signed)
Spoke with pt in regards

## 2014-05-01 ENCOUNTER — Ambulatory Visit: Payer: No Typology Code available for payment source | Attending: Otolaryngology | Admitting: Physical Therapy

## 2014-05-01 DIAGNOSIS — H8309 Labyrinthitis, unspecified ear: Secondary | ICD-10-CM | POA: Insufficient documentation

## 2014-05-01 DIAGNOSIS — H8110 Benign paroxysmal vertigo, unspecified ear: Secondary | ICD-10-CM | POA: Insufficient documentation

## 2014-05-01 DIAGNOSIS — R269 Unspecified abnormalities of gait and mobility: Secondary | ICD-10-CM | POA: Diagnosis not present

## 2014-05-01 DIAGNOSIS — IMO0001 Reserved for inherently not codable concepts without codable children: Secondary | ICD-10-CM | POA: Insufficient documentation

## 2014-05-04 ENCOUNTER — Encounter: Payer: Self-pay | Admitting: *Deleted

## 2014-05-06 ENCOUNTER — Ambulatory Visit: Payer: No Typology Code available for payment source | Attending: Otolaryngology | Admitting: Physical Therapy

## 2014-05-06 DIAGNOSIS — IMO0001 Reserved for inherently not codable concepts without codable children: Secondary | ICD-10-CM | POA: Diagnosis not present

## 2014-05-06 DIAGNOSIS — H8309 Labyrinthitis, unspecified ear: Secondary | ICD-10-CM | POA: Insufficient documentation

## 2014-05-06 DIAGNOSIS — H8110 Benign paroxysmal vertigo, unspecified ear: Secondary | ICD-10-CM | POA: Diagnosis not present

## 2014-05-13 ENCOUNTER — Ambulatory Visit: Payer: No Typology Code available for payment source | Admitting: Physical Therapy

## 2014-05-13 DIAGNOSIS — IMO0001 Reserved for inherently not codable concepts without codable children: Secondary | ICD-10-CM | POA: Diagnosis not present

## 2014-05-21 ENCOUNTER — Ambulatory Visit: Payer: No Typology Code available for payment source | Admitting: Physical Therapy

## 2014-05-21 DIAGNOSIS — IMO0001 Reserved for inherently not codable concepts without codable children: Secondary | ICD-10-CM | POA: Diagnosis not present

## 2014-05-28 ENCOUNTER — Ambulatory Visit: Payer: No Typology Code available for payment source | Admitting: Physical Therapy

## 2014-05-28 DIAGNOSIS — IMO0001 Reserved for inherently not codable concepts without codable children: Secondary | ICD-10-CM | POA: Diagnosis not present

## 2014-06-04 ENCOUNTER — Ambulatory Visit: Payer: No Typology Code available for payment source | Admitting: Physical Therapy

## 2014-06-04 DIAGNOSIS — IMO0001 Reserved for inherently not codable concepts without codable children: Secondary | ICD-10-CM | POA: Diagnosis not present

## 2014-06-12 ENCOUNTER — Ambulatory Visit
Payer: No Typology Code available for payment source | Attending: Otolaryngology | Admitting: Rehabilitative and Restorative Service Providers"

## 2014-06-12 DIAGNOSIS — IMO0001 Reserved for inherently not codable concepts without codable children: Secondary | ICD-10-CM | POA: Insufficient documentation

## 2014-06-12 DIAGNOSIS — H8309 Labyrinthitis, unspecified ear: Secondary | ICD-10-CM | POA: Insufficient documentation

## 2014-06-12 DIAGNOSIS — H8110 Benign paroxysmal vertigo, unspecified ear: Secondary | ICD-10-CM | POA: Insufficient documentation

## 2014-06-19 ENCOUNTER — Ambulatory Visit: Payer: No Typology Code available for payment source | Admitting: Physical Therapy

## 2014-06-19 DIAGNOSIS — IMO0001 Reserved for inherently not codable concepts without codable children: Secondary | ICD-10-CM | POA: Diagnosis present

## 2014-06-19 DIAGNOSIS — H8309 Labyrinthitis, unspecified ear: Secondary | ICD-10-CM | POA: Diagnosis not present

## 2014-06-19 DIAGNOSIS — H8110 Benign paroxysmal vertigo, unspecified ear: Secondary | ICD-10-CM | POA: Diagnosis not present

## 2014-06-25 ENCOUNTER — Encounter: Payer: No Typology Code available for payment source | Admitting: Physical Therapy

## 2015-10-21 DIAGNOSIS — M65311 Trigger thumb, right thumb: Secondary | ICD-10-CM | POA: Insufficient documentation

## 2015-10-28 DIAGNOSIS — S63592A Other specified sprain of left wrist, initial encounter: Secondary | ICD-10-CM | POA: Insufficient documentation

## 2015-10-28 DIAGNOSIS — S63591A Other specified sprain of right wrist, initial encounter: Secondary | ICD-10-CM | POA: Insufficient documentation

## 2016-01-26 ENCOUNTER — Other Ambulatory Visit (HOSPITAL_COMMUNITY): Payer: Self-pay | Admitting: Geriatric Medicine

## 2016-01-26 DIAGNOSIS — R748 Abnormal levels of other serum enzymes: Secondary | ICD-10-CM

## 2016-02-04 ENCOUNTER — Encounter (HOSPITAL_COMMUNITY)
Admission: RE | Admit: 2016-02-04 | Discharge: 2016-02-04 | Disposition: A | Discharge status: Home / Self Care | Payer: BLUE CROSS/BLUE SHIELD | Source: Ambulatory Visit | Attending: Geriatric Medicine | Admitting: Geriatric Medicine

## 2016-02-04 ENCOUNTER — Ambulatory Visit (HOSPITAL_COMMUNITY)
Admission: RE | Admit: 2016-02-04 | Discharge: 2016-02-04 | Disposition: A | Discharge status: Home / Self Care | Payer: BLUE CROSS/BLUE SHIELD | Source: Ambulatory Visit | Attending: Geriatric Medicine | Admitting: Geriatric Medicine

## 2016-02-04 DIAGNOSIS — R748 Abnormal levels of other serum enzymes: Secondary | ICD-10-CM | POA: Diagnosis not present

## 2016-02-04 MED ORDER — TECHNETIUM TC 99M MEDRONATE IV KIT
26.4000 | PACK | Freq: Once | INTRAVENOUS | Status: AC | PRN
  Administered 2016-02-04: 26.4 via INTRAVENOUS

## 2016-02-04 MED ORDER — TECHNETIUM TC 99M MEBROFENIN IV KIT: 26.4000 | PACK | Freq: Once | INTRAVENOUS | Status: DC | PRN

## 2016-02-07 ENCOUNTER — Encounter (HOSPITAL_COMMUNITY): Payer: No Typology Code available for payment source

## 2016-09-26 DIAGNOSIS — Z79899 Other long term (current) drug therapy: Secondary | ICD-10-CM | POA: Diagnosis not present

## 2016-09-26 DIAGNOSIS — M85859 Other specified disorders of bone density and structure, unspecified thigh: Secondary | ICD-10-CM | POA: Diagnosis not present

## 2016-09-26 DIAGNOSIS — K219 Gastro-esophageal reflux disease without esophagitis: Secondary | ICD-10-CM | POA: Diagnosis not present

## 2016-09-26 DIAGNOSIS — F17210 Nicotine dependence, cigarettes, uncomplicated: Secondary | ICD-10-CM | POA: Diagnosis not present

## 2016-09-26 DIAGNOSIS — H8113 Benign paroxysmal vertigo, bilateral: Secondary | ICD-10-CM | POA: Diagnosis not present

## 2016-09-26 DIAGNOSIS — Z0000 Encounter for general adult medical examination without abnormal findings: Secondary | ICD-10-CM | POA: Diagnosis not present

## 2016-09-26 DIAGNOSIS — G2581 Restless legs syndrome: Secondary | ICD-10-CM | POA: Diagnosis not present

## 2016-09-26 DIAGNOSIS — M109 Gout, unspecified: Secondary | ICD-10-CM | POA: Diagnosis not present

## 2016-09-26 DIAGNOSIS — E039 Hypothyroidism, unspecified: Secondary | ICD-10-CM | POA: Diagnosis not present

## 2016-09-26 DIAGNOSIS — Z23 Encounter for immunization: Secondary | ICD-10-CM | POA: Diagnosis not present

## 2016-09-26 DIAGNOSIS — F325 Major depressive disorder, single episode, in full remission: Secondary | ICD-10-CM | POA: Diagnosis not present

## 2016-09-26 DIAGNOSIS — E7800 Pure hypercholesterolemia, unspecified: Secondary | ICD-10-CM | POA: Diagnosis not present

## 2016-10-26 ENCOUNTER — Other Ambulatory Visit: Payer: Self-pay | Admitting: Gastroenterology

## 2016-11-14 DIAGNOSIS — F322 Major depressive disorder, single episode, severe without psychotic features: Secondary | ICD-10-CM | POA: Diagnosis not present

## 2016-11-29 DIAGNOSIS — F322 Major depressive disorder, single episode, severe without psychotic features: Secondary | ICD-10-CM | POA: Diagnosis not present

## 2016-12-04 ENCOUNTER — Other Ambulatory Visit: Payer: Self-pay | Admitting: Geriatric Medicine

## 2016-12-04 DIAGNOSIS — F17208 Nicotine dependence, unspecified, with other nicotine-induced disorders: Secondary | ICD-10-CM

## 2016-12-20 ENCOUNTER — Encounter (HOSPITAL_COMMUNITY): Payer: Self-pay | Admitting: *Deleted

## 2016-12-25 ENCOUNTER — Ambulatory Visit (HOSPITAL_COMMUNITY): Admission: RE | Admit: 2016-12-25 | Payer: PPO | Source: Ambulatory Visit | Admitting: Gastroenterology

## 2016-12-25 ENCOUNTER — Encounter (HOSPITAL_COMMUNITY): Admission: RE | Payer: Self-pay | Source: Ambulatory Visit

## 2016-12-25 SURGERY — COLONOSCOPY WITH PROPOFOL
Anesthesia: Monitor Anesthesia Care

## 2016-12-25 MED ORDER — LIDOCAINE 2% (20 MG/ML) 5 ML SYRINGE
INTRAMUSCULAR | Status: AC
  Filled 2016-12-25: qty 5

## 2016-12-25 MED ORDER — PROPOFOL 10 MG/ML IV BOLUS
INTRAVENOUS | Status: AC
  Filled 2016-12-25: qty 60

## 2017-01-03 DIAGNOSIS — M8588 Other specified disorders of bone density and structure, other site: Secondary | ICD-10-CM | POA: Diagnosis not present

## 2017-01-03 DIAGNOSIS — Z1231 Encounter for screening mammogram for malignant neoplasm of breast: Secondary | ICD-10-CM | POA: Diagnosis not present

## 2017-01-03 DIAGNOSIS — Z780 Asymptomatic menopausal state: Secondary | ICD-10-CM | POA: Diagnosis not present

## 2017-01-16 ENCOUNTER — Ambulatory Visit
Admission: RE | Admit: 2017-01-16 | Discharge: 2017-01-16 | Disposition: A | Discharge status: Home / Self Care | Payer: PPO | Source: Ambulatory Visit | Attending: Geriatric Medicine | Admitting: Geriatric Medicine

## 2017-01-16 ENCOUNTER — Ambulatory Visit: Payer: PPO

## 2017-01-16 DIAGNOSIS — F17208 Nicotine dependence, unspecified, with other nicotine-induced disorders: Secondary | ICD-10-CM

## 2017-01-16 DIAGNOSIS — Z87891 Personal history of nicotine dependence: Secondary | ICD-10-CM | POA: Diagnosis not present

## 2017-01-18 ENCOUNTER — Other Ambulatory Visit (HOSPITAL_COMMUNITY): Payer: Self-pay | Admitting: Geriatric Medicine

## 2017-01-18 DIAGNOSIS — R911 Solitary pulmonary nodule: Secondary | ICD-10-CM

## 2017-02-01 ENCOUNTER — Encounter (HOSPITAL_COMMUNITY)
Admission: RE | Admit: 2017-02-01 | Discharge: 2017-02-01 | Disposition: A | Discharge status: Home / Self Care | Payer: PPO | Source: Ambulatory Visit | Attending: Geriatric Medicine | Admitting: Geriatric Medicine

## 2017-02-01 DIAGNOSIS — R911 Solitary pulmonary nodule: Secondary | ICD-10-CM | POA: Diagnosis not present

## 2017-02-01 LAB — GLUCOSE, CAPILLARY: Glucose-Capillary: 102 mg/dL — ABNORMAL HIGH (ref 65–99)

## 2017-02-01 MED ORDER — FLUDEOXYGLUCOSE F - 18 (FDG) INJECTION
8.9600 | Freq: Once | INTRAVENOUS | Status: AC | PRN
  Administered 2017-02-01: 8.96 via INTRAVENOUS

## 2017-03-27 DIAGNOSIS — H8113 Benign paroxysmal vertigo, bilateral: Secondary | ICD-10-CM | POA: Diagnosis not present

## 2017-03-27 DIAGNOSIS — H0520 Unspecified exophthalmos: Secondary | ICD-10-CM | POA: Diagnosis not present

## 2017-03-27 DIAGNOSIS — F17210 Nicotine dependence, cigarettes, uncomplicated: Secondary | ICD-10-CM | POA: Diagnosis not present

## 2017-03-27 DIAGNOSIS — R911 Solitary pulmonary nodule: Secondary | ICD-10-CM | POA: Diagnosis not present

## 2017-07-18 ENCOUNTER — Other Ambulatory Visit: Payer: Self-pay | Admitting: Geriatric Medicine

## 2017-07-18 DIAGNOSIS — R911 Solitary pulmonary nodule: Secondary | ICD-10-CM

## 2017-08-03 ENCOUNTER — Inpatient Hospital Stay: Admission: RE | Admit: 2017-08-03 | Payer: PPO | Source: Ambulatory Visit

## 2017-08-17 DIAGNOSIS — R911 Solitary pulmonary nodule: Secondary | ICD-10-CM | POA: Diagnosis not present

## 2017-08-17 DIAGNOSIS — Z23 Encounter for immunization: Secondary | ICD-10-CM | POA: Diagnosis not present

## 2017-08-17 DIAGNOSIS — K1121 Acute sialoadenitis: Secondary | ICD-10-CM | POA: Diagnosis not present

## 2017-10-05 DIAGNOSIS — Z79899 Other long term (current) drug therapy: Secondary | ICD-10-CM | POA: Diagnosis not present

## 2017-10-05 DIAGNOSIS — F325 Major depressive disorder, single episode, in full remission: Secondary | ICD-10-CM | POA: Diagnosis not present

## 2017-10-05 DIAGNOSIS — H8113 Benign paroxysmal vertigo, bilateral: Secondary | ICD-10-CM | POA: Diagnosis not present

## 2017-10-05 DIAGNOSIS — L989 Disorder of the skin and subcutaneous tissue, unspecified: Secondary | ICD-10-CM | POA: Diagnosis not present

## 2017-10-05 DIAGNOSIS — E7800 Pure hypercholesterolemia, unspecified: Secondary | ICD-10-CM | POA: Diagnosis not present

## 2017-10-05 DIAGNOSIS — D369 Benign neoplasm, unspecified site: Secondary | ICD-10-CM | POA: Diagnosis not present

## 2017-10-05 DIAGNOSIS — F17210 Nicotine dependence, cigarettes, uncomplicated: Secondary | ICD-10-CM | POA: Diagnosis not present

## 2017-10-05 DIAGNOSIS — K219 Gastro-esophageal reflux disease without esophagitis: Secondary | ICD-10-CM | POA: Diagnosis not present

## 2017-10-05 DIAGNOSIS — R911 Solitary pulmonary nodule: Secondary | ICD-10-CM | POA: Diagnosis not present

## 2017-10-05 DIAGNOSIS — F4310 Post-traumatic stress disorder, unspecified: Secondary | ICD-10-CM | POA: Diagnosis not present

## 2017-10-05 DIAGNOSIS — E039 Hypothyroidism, unspecified: Secondary | ICD-10-CM | POA: Diagnosis not present

## 2017-10-05 DIAGNOSIS — Z0000 Encounter for general adult medical examination without abnormal findings: Secondary | ICD-10-CM | POA: Diagnosis not present

## 2017-11-02 DIAGNOSIS — R42 Dizziness and giddiness: Secondary | ICD-10-CM | POA: Diagnosis not present

## 2017-12-05 DIAGNOSIS — L821 Other seborrheic keratosis: Secondary | ICD-10-CM | POA: Diagnosis not present

## 2017-12-05 DIAGNOSIS — L570 Actinic keratosis: Secondary | ICD-10-CM | POA: Diagnosis not present

## 2017-12-11 DIAGNOSIS — H2513 Age-related nuclear cataract, bilateral: Secondary | ICD-10-CM | POA: Diagnosis not present

## 2018-02-04 DIAGNOSIS — K922 Gastrointestinal hemorrhage, unspecified: Secondary | ICD-10-CM

## 2018-02-04 HISTORY — DX: Gastrointestinal hemorrhage, unspecified: K92.2

## 2018-02-20 DIAGNOSIS — Z1231 Encounter for screening mammogram for malignant neoplasm of breast: Secondary | ICD-10-CM | POA: Diagnosis not present

## 2018-02-20 DIAGNOSIS — Z803 Family history of malignant neoplasm of breast: Secondary | ICD-10-CM | POA: Diagnosis not present

## 2018-02-27 ENCOUNTER — Encounter (HOSPITAL_COMMUNITY): Payer: Self-pay | Admitting: Emergency Medicine

## 2018-02-27 ENCOUNTER — Other Ambulatory Visit: Payer: Self-pay

## 2018-02-27 DIAGNOSIS — Z87442 Personal history of urinary calculi: Secondary | ICD-10-CM | POA: Diagnosis not present

## 2018-02-27 DIAGNOSIS — Z79899 Other long term (current) drug therapy: Secondary | ICD-10-CM | POA: Diagnosis not present

## 2018-02-27 DIAGNOSIS — G2581 Restless legs syndrome: Secondary | ICD-10-CM | POA: Diagnosis not present

## 2018-02-27 DIAGNOSIS — F329 Major depressive disorder, single episode, unspecified: Secondary | ICD-10-CM | POA: Insufficient documentation

## 2018-02-27 DIAGNOSIS — M109 Gout, unspecified: Secondary | ICD-10-CM | POA: Insufficient documentation

## 2018-02-27 DIAGNOSIS — K5791 Diverticulosis of intestine, part unspecified, without perforation or abscess with bleeding: Principal | ICD-10-CM | POA: Insufficient documentation

## 2018-02-27 DIAGNOSIS — Z885 Allergy status to narcotic agent status: Secondary | ICD-10-CM | POA: Insufficient documentation

## 2018-02-27 DIAGNOSIS — E7800 Pure hypercholesterolemia, unspecified: Secondary | ICD-10-CM | POA: Diagnosis not present

## 2018-02-27 DIAGNOSIS — Z86010 Personal history of colonic polyps: Secondary | ICD-10-CM | POA: Insufficient documentation

## 2018-02-27 DIAGNOSIS — K219 Gastro-esophageal reflux disease without esophagitis: Secondary | ICD-10-CM | POA: Insufficient documentation

## 2018-02-27 DIAGNOSIS — Z7982 Long term (current) use of aspirin: Secondary | ICD-10-CM | POA: Insufficient documentation

## 2018-02-27 DIAGNOSIS — F419 Anxiety disorder, unspecified: Secondary | ICD-10-CM | POA: Diagnosis not present

## 2018-02-27 DIAGNOSIS — E039 Hypothyroidism, unspecified: Secondary | ICD-10-CM | POA: Diagnosis not present

## 2018-02-27 DIAGNOSIS — K922 Gastrointestinal hemorrhage, unspecified: Secondary | ICD-10-CM | POA: Diagnosis not present

## 2018-02-27 LAB — COMPREHENSIVE METABOLIC PANEL
ALT: 18 U/L (ref 14–54)
ANION GAP: 10 (ref 5–15)
AST: 20 U/L (ref 15–41)
Albumin: 4.5 g/dL (ref 3.5–5.0)
Alkaline Phosphatase: 137 U/L — ABNORMAL HIGH (ref 38–126)
BUN: 15 mg/dL (ref 6–20)
CHLORIDE: 108 mmol/L (ref 101–111)
CO2: 23 mmol/L (ref 22–32)
Calcium: 9.7 mg/dL (ref 8.9–10.3)
Creatinine, Ser: 0.97 mg/dL (ref 0.44–1.00)
Glucose, Bld: 89 mg/dL (ref 65–99)
Potassium: 4.4 mmol/L (ref 3.5–5.1)
SODIUM: 141 mmol/L (ref 135–145)
Total Bilirubin: 0.7 mg/dL (ref 0.3–1.2)
Total Protein: 7.6 g/dL (ref 6.5–8.1)

## 2018-02-27 LAB — CBC
HCT: 44.5 % (ref 36.0–46.0)
HEMOGLOBIN: 15.1 g/dL — AB (ref 12.0–15.0)
MCH: 33.4 pg (ref 26.0–34.0)
MCHC: 33.9 g/dL (ref 30.0–36.0)
MCV: 98.5 fL (ref 78.0–100.0)
Platelets: 264 10*3/uL (ref 150–400)
RBC: 4.52 MIL/uL (ref 3.87–5.11)
RDW: 13.3 % (ref 11.5–15.5)
WBC: 11 10*3/uL — AB (ref 4.0–10.5)

## 2018-02-27 LAB — I-STAT BETA HCG BLOOD, ED (MC, WL, AP ONLY)

## 2018-02-27 NOTE — ED Triage Notes (Signed)
Pt states she has had 5 episodes of bright red rectal bleeding today  Pt called her dr today and was told to come her for evaluation  Pt denies pain  States she has been passing some clots along with the blood   Pt states she feels tired

## 2018-02-28 ENCOUNTER — Encounter (HOSPITAL_COMMUNITY): Payer: Self-pay | Admitting: Internal Medicine

## 2018-02-28 ENCOUNTER — Observation Stay (HOSPITAL_COMMUNITY)
Admission: EM | Admit: 2018-02-28 | Discharge: 2018-03-01 | Disposition: A | Discharge status: Home / Self Care | Payer: PPO | Attending: Internal Medicine | Admitting: Internal Medicine

## 2018-02-28 ENCOUNTER — Other Ambulatory Visit: Payer: Self-pay

## 2018-02-28 DIAGNOSIS — D62 Acute posthemorrhagic anemia: Secondary | ICD-10-CM | POA: Diagnosis not present

## 2018-02-28 DIAGNOSIS — D500 Iron deficiency anemia secondary to blood loss (chronic): Secondary | ICD-10-CM | POA: Diagnosis not present

## 2018-02-28 DIAGNOSIS — Z860100 Personal history of colon polyps, unspecified: Secondary | ICD-10-CM

## 2018-02-28 DIAGNOSIS — K920 Hematemesis: Secondary | ICD-10-CM | POA: Diagnosis not present

## 2018-02-28 DIAGNOSIS — K922 Gastrointestinal hemorrhage, unspecified: Secondary | ICD-10-CM | POA: Diagnosis not present

## 2018-02-28 DIAGNOSIS — E785 Hyperlipidemia, unspecified: Secondary | ICD-10-CM | POA: Diagnosis present

## 2018-02-28 LAB — BASIC METABOLIC PANEL
Anion gap: 7 (ref 5–15)
BUN: 11 mg/dL (ref 6–20)
CALCIUM: 8.5 mg/dL — AB (ref 8.9–10.3)
CO2: 20 mmol/L — AB (ref 22–32)
CREATININE: 0.73 mg/dL (ref 0.44–1.00)
Chloride: 113 mmol/L — ABNORMAL HIGH (ref 101–111)
GFR calc non Af Amer: >60 mL/min (ref >60)
Glucose, Bld: 90 mg/dL (ref 65–99)
Potassium: 3.7 mmol/L (ref 3.5–5.1)
SODIUM: 140 mmol/L (ref 135–145)

## 2018-02-28 LAB — ABO/RH: ABO/RH(D): O POS

## 2018-02-28 LAB — CBC
HCT: 39.1 % (ref 36.0–46.0)
HCT: 38.5 % (ref 36.0–46.0)
HEMATOCRIT: 37.5 % (ref 36.0–46.0)
Hemoglobin: 12.8 g/dL (ref 12.0–15.0)
Hemoglobin: 12.3 g/dL (ref 12.0–15.0)
Hemoglobin: 12.6 g/dL (ref 12.0–15.0)
MCH: 32.2 pg (ref 26.0–34.0)
MCH: 32.1 pg (ref 26.0–34.0)
MCH: 32.2 pg (ref 26.0–34.0)
MCHC: 32.7 g/dL (ref 30.0–36.0)
MCHC: 32.8 g/dL (ref 30.0–36.0)
MCHC: 32.7 g/dL (ref 30.0–36.0)
MCV: 98.2 fL (ref 78.0–100.0)
MCV: 97.9 fL (ref 78.0–100.0)
MCV: 98.5 fL (ref 78.0–100.0)
PLATELETS: 252 10*3/uL (ref 150–400)
PLATELETS: 223 10*3/uL (ref 150–400)
Platelets: 231 10*3/uL (ref 150–400)
RBC: 3.98 MIL/uL (ref 3.87–5.11)
RBC: 3.83 MIL/uL — ABNORMAL LOW (ref 3.87–5.11)
RBC: 3.91 MIL/uL (ref 3.87–5.11)
RDW: 13.4 % (ref 11.5–15.5)
RDW: 13.5 % (ref 11.5–15.5)
RDW: 13.3 % (ref 11.5–15.5)
WBC: 11.9 10*3/uL — ABNORMAL HIGH (ref 4.0–10.5)
WBC: 8.8 10*3/uL (ref 4.0–10.5)
WBC: 6.7 10*3/uL (ref 4.0–10.5)

## 2018-02-28 LAB — HIV ANTIBODY (ROUTINE TESTING W REFLEX): HIV SCREEN 4TH GENERATION: NONREACTIVE

## 2018-02-28 LAB — GLUCOSE, CAPILLARY
Glucose-Capillary: 111 mg/dL — ABNORMAL HIGH (ref 65–99)
Glucose-Capillary: 94 mg/dL (ref 65–99)

## 2018-02-28 LAB — POC OCCULT BLOOD, ED: Fecal Occult Bld: POSITIVE — AB

## 2018-02-28 LAB — TYPE AND SCREEN
ABO/RH(D): O POS
ANTIBODY SCREEN: NEGATIVE

## 2018-02-28 LAB — CBG MONITORING, ED: Glucose-Capillary: 91 mg/dL (ref 65–99)

## 2018-02-28 MED ORDER — ONDANSETRON HCL 4 MG PO TABS: 4.0000 mg | ORAL_TABLET | Freq: Four times a day (QID) | ORAL | Status: DC | PRN

## 2018-02-28 MED ORDER — SODIUM CHLORIDE 0.9 % IV BOLUS
1000.0000 mL | Freq: Once | INTRAVENOUS | Status: AC
  Administered 2018-02-28: 1000 mL via INTRAVENOUS

## 2018-02-28 MED ORDER — DIAZEPAM 5 MG PO TABS: 5.0000 mg | ORAL_TABLET | Freq: Every day | ORAL | Status: DC | PRN

## 2018-02-28 MED ORDER — SODIUM CHLORIDE 0.9 % IV SOLN
INTRAVENOUS | Status: DC
  Administered 2018-02-28: 02:00:00 via INTRAVENOUS

## 2018-02-28 MED ORDER — FAMOTIDINE IN NACL 20-0.9 MG/50ML-% IV SOLN
20.0000 mg | Freq: Two times a day (BID) | INTRAVENOUS | Status: DC
  Administered 2018-02-28: 20 mg via INTRAVENOUS
  Filled 2018-02-28: qty 50

## 2018-02-28 MED ORDER — DEXTROSE IN LACTATED RINGERS 5 % IV SOLN
INTRAVENOUS | Status: DC
  Administered 2018-02-28 – 2018-03-01 (×2): via INTRAVENOUS

## 2018-02-28 MED ORDER — ONDANSETRON HCL 4 MG/2ML IJ SOLN: 4.0000 mg | Freq: Four times a day (QID) | INTRAMUSCULAR | Status: DC | PRN

## 2018-02-28 MED ORDER — ATORVASTATIN CALCIUM 10 MG PO TABS
10.0000 mg | ORAL_TABLET | Freq: Every day | ORAL | Status: DC
  Administered 2018-02-28: 10 mg via ORAL
  Filled 2018-02-28: qty 1

## 2018-02-28 MED ORDER — ACETAMINOPHEN 650 MG RE SUPP: 650.0000 mg | Freq: Four times a day (QID) | RECTAL | Status: DC | PRN

## 2018-02-28 MED ORDER — ACETAMINOPHEN 325 MG PO TABS: 650.0000 mg | ORAL_TABLET | Freq: Four times a day (QID) | ORAL | Status: DC | PRN
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
## 2018-02-28 NOTE — Progress Notes (Signed)
PROGRESS NOTE  I saw and evaluated the patient, I personally obtain the key portions of the history and physical exam, I reviewed the physician assistant student documentation and agree with the physician assistant student medical decision-making. Further assessment and plan are as follows:  Please see attending's daily progress note.  Andrea Kline M.D.  Andrea Kline  MWN:[id number] DOB: [birth year] DOA: 02/28/2018 PCP: Andrea Manes, MD Outpatient Specialists:   Brief Narrative:  62 year old female with history of vertigo, anxiety, depression, hyperlipidemia, GERD, and 10 mm left lower lung nodule. Patient presented to the ED after five episodes of bright red blood per rectum, usually about one bowl containing bright red blood and clots. She has never had these symptoms before and has not had them since coming to ED. Denied chest pain, abdominal pain, vomiting and diarrhea. States that she has intermittent nausea due to vertigo but does not feel that she has had any related to this event. Mother died of colon cancer at age 68. Brother is currently hospitalized for Stage IV lung cancer. Last colonoscopy was 2011, patient has history of polyps and diverticulosis. No history of hemorrhoids or IBD. In ED, temp 98.1 BP 156/100, HR 114, RR 18, SpO2 99% on room air, she was hemodynamically stable with Hgb 15, WBC 11.9, positive FOBT. She was admitted with working diagnosis of acute GI bleed.   Assessment & Plan:   Acute GI Bleed: - most likely lower GI bleed due to bright red blood per rectum - no history of hemorrhoids or IBD - Last colonoscopy 2011 showing polyps and diverticulosis, Fam Hx colon cancer - GI consulted, will wait for recommendation regarding EGD and/or colonoscopy - continue serial CBC's  Hyperlipidemia: - managed on statin, resume when no longer npo  History of GERD: - IV Protonix  Left Lower Lung Nodule - patient did not follow up with 6 month CT scan for LLL nodule,  patient will be encouraged to follow up outpatient  DVT prophylaxis: SCD's Code Status: Full Family Communication: None at bedside Disposition Plan: Home when clinically stable   Consultants:   GI  Procedures:   None  Antimicrobials:   None   Subjective: Patient was alert and oriented, but appeared emotionally labile. She broke out into tears multiple times and frequently brought up brother who is currently hospitalized for Stage IV lung cancer. Patient denies nausea, vomiting, diarrhea, abdominal pain, or chest pain. She has not has a bowel movement yet today.   Objective: Vitals:   02/28/18 0700 02/28/18 0800 02/28/18 0900 02/28/18 1000  BP: 123/63 126/71 (!) 142/93 131/69  Pulse: 76 81 76 72  Resp: 17 14 18 17   Temp:      TempSrc:      SpO2: 94% 94% 94% 95%  Weight:      Height:       No intake or output data in the 24 hours ending 02/28/18 1051 Filed Weights   02/27/18 1942  Weight: 81.6 kg (180 lb)    Examination:  General exam: Tearful on exam, but pleasant and cooperative.  Respiratory system: Clear to auscultation. Respiratory effort normal. Cardiovascular system: S1 & S2 heard, RRR. No JVD, murmurs. No peripheral edema. Gastrointestinal system: Abdomen is nondistended, soft and nontender. Normal bowel sounds heard. Central nervous system: Alert and oriented. No focal neurological deficits. Skin: No rashes, lesions or ulcers Psychiatry: Judgement and insight appear normal.     Data Reviewed: I have personally reviewed following labs and imaging studies  CBC:  Recent Labs  Lab 02/27/18 1957 02/28/18 0451 02/28/18 0741  WBC 11.0* 11.9* 8.8  HGB 15.1* 12.8 12.3  HCT 44.5 39.1 37.5  MCV 98.5 98.2 97.9  PLT 264 252 354   Basic Metabolic Panel: Recent Labs  Lab 02/27/18 1957 02/28/18 0451  NA 141 140  K 4.4 3.7  CL 108 113*  CO2 23 20*  GLUCOSE 89 90  BUN 15 11  CREATININE 0.97 0.73  CALCIUM 9.7 8.5*   GFR: Estimated Creatinine  Clearance: 78.9 mL/min (by C-G formula based on SCr of 0.73 mg/dL). Liver Function Tests: Recent Labs  Lab 02/27/18 1957  AST 20  ALT 18  ALKPHOS 137*  BILITOT 0.7  PROT 7.6  ALBUMIN 4.5   No results for input(s): LIPASE, AMYLASE in the last 168 hours. No results for input(s): AMMONIA in the last 168 hours. Coagulation Profile: No results for input(s): INR, PROTIME in the last 168 hours. Cardiac Enzymes: No results for input(s): CKTOTAL, CKMB, CKMBINDEX, TROPONINI in the last 168 hours. BNP (last 3 results) No results for input(s): PROBNP in the last 8760 hours. HbA1C: No results for input(s): HGBA1C in the last 72 hours. CBG: Recent Labs  Lab 02/28/18 0744  GLUCAP 91   Lipid Profile: No results for input(s): CHOL, HDL, LDLCALC, TRIG, CHOLHDL, LDLDIRECT in the last 72 hours. Thyroid Function Tests: No results for input(s): TSH, T4TOTAL, FREET4, T3FREE, THYROIDAB in the last 72 hours. Anemia Panel: No results for input(s): VITAMINB12, FOLATE, FERRITIN, TIBC, IRON, RETICCTPCT in the last 72 hours. Urine analysis:    Component Value Date/Time   COLORURINE YELLOW 04/06/2014 0201   APPEARANCEUR CLEAR 04/06/2014 0201   LABSPEC 1.005 04/06/2014 0201   PHURINE 6.0 04/06/2014 0201   GLUCOSEU NEGATIVE 04/06/2014 0201   HGBUR MODERATE (A) 04/06/2014 0201   BILIRUBINUR NEGATIVE 04/06/2014 0201   KETONESUR NEGATIVE 04/06/2014 0201   PROTEINUR NEGATIVE 04/06/2014 0201   UROBILINOGEN 0.2 04/06/2014 0201   NITRITE NEGATIVE 04/06/2014 0201   LEUKOCYTESUR NEGATIVE 04/06/2014 0201   Sepsis Labs: @LABRCNTIP (procalcitonin:4,lacticidven:4)  )No results found for this or any previous visit (from the past 240 hour(s)).       Radiology Studies: No results found.      Scheduled Meds: Continuous Infusions: . sodium chloride 125 mL/hr at 02/28/18 1022     LOS: 0 days   Time spent: 81min  Merlene Pulling, PA-S [facility] If 7PM-7AM, please contact  night-coverage [email or web address] Password [facility] 02/28/2018, 10:51 AM
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
## 2018-02-28 NOTE — ED Notes (Signed)
Pt only wants one IV

## 2018-02-28 NOTE — Progress Notes (Signed)
PROGRESS NOTE    Andrea Kline  VOJ:[id number] DOB: 1956-07-09 DOA: 02/28/2018 PCP: [name], MD    Brief Narrative:  62 year old female who presented with rectal bleeding.  She does have the significant past medical history for hyperlipidemia, and GERD.  Patient had 5 episodes of bright red blood per rectum, about a bowl full, with no orthostatic symptoms.  On the initial physical examination blood pressure 150/78, heart rate 93, respirate 16, temperature 98.1, oxygen saturation 100%.  Moist mucous membranes, lungs clear to auscultation, heart S1-S2 present rhythmic, the abdomen soft nontender, no lower extremity edema.  Patient was admitted to the hospital working diagnosis of rectal bleeding.    Assessment & Plan:   Principal Problem:   Acute GI bleeding Active Problems:   HLD (hyperlipidemia)   History of colon polyps   1. Rectal bleeding. Will continue close hb and hct monitoring, no signs of further bleeding, will add antiacid therapy and will follow on GI recommendations. IV fluids and as needed antiemetics.   2. Dyslipidemia. Will resume statin therapy.  3. Anxiety and depression. Will continue diazepam, as needed.    DVT prophylaxis: scd   Code Status: full Family Communication: no family at the beside Disposition Plan: home   Consultants:     Procedures:     Antimicrobials:       Subjective: Patient is anxious about her diagnosis, explained the plan for the next 24 hours, and all questions were addressed, no abdominal pain, no nausea or vomiting, no further hematochezia.   Objective: Vitals:   02/28/18 1300 02/28/18 1400 02/28/18 1500 02/28/18 1537  BP: 114/78 (!) 130/107  137/73  Pulse: 83 83  72  Resp: 16 14  18   Temp:    98 F (36.7 C)  TempSrc:    Oral  SpO2: 92% 94%  98%  Weight:   80.7 kg (177 lb 14.4 oz)   Height:   5\' 5"  (1.651 m)     Intake/Output Summary (Last 24 hours) at 02/28/2018 1755 Last data filed at 02/28/2018  1400 Gross per 24 hour  Intake 0 ml  Output -  Net 0 ml   Filed Weights   02/27/18 1942 02/28/18 1500  Weight: 81.6 kg (180 lb) 80.7 kg (177 lb 14.4 oz)    Examination:   General: Not in pain or dyspnea.  Neurology: Awake and alert, non focal  E ENT: no pallor, no icterus, oral mucosa moist Cardiovascular: No JVD. S1-S2 present, rhythmic, no gallops, rubs, or murmurs. No lower extremity edema. Pulmonary: vesicular breath sounds bilaterally, adequate air movement, no wheezing, rhonchi or rales. Gastrointestinal. Abdomen with no organomegaly, non tender, no rebound or guarding Skin. No rashes Musculoskeletal: no joint deformities     Data Reviewed: I have personally reviewed following labs and imaging studies  CBC: Recent Labs  Lab 02/27/18 1957 02/28/18 0451 02/28/18 0741 02/28/18 1210  WBC 11.0* 11.9* 8.8 6.7  HGB 15.1* 12.8 12.3 12.6  HCT 44.5 39.1 37.5 38.5  MCV 98.5 98.2 97.9 98.5  PLT 264 252 223 993   Basic Metabolic Panel: Recent Labs  Lab 02/27/18 1957 02/28/18 0451  NA 141 140  K 4.4 3.7  CL 108 113*  CO2 23 20*  GLUCOSE 89 90  BUN 15 11  CREATININE 0.97 0.73  CALCIUM 9.7 8.5*   GFR: Estimated Creatinine Clearance: 78.5 mL/min (by C-G formula based on SCr of 0.73 mg/dL). Liver Function Tests: Recent Labs  Lab 02/27/18 1957  AST 20  ALT 18  ALKPHOS 137*  BILITOT 0.7  PROT 7.6  ALBUMIN 4.5   No results for input(s): LIPASE, AMYLASE in the last 168 hours. No results for input(s): AMMONIA in the last 168 hours. Coagulation Profile: No results for input(s): INR, PROTIME in the last 168 hours. Cardiac Enzymes: No results for input(s): CKTOTAL, CKMB, CKMBINDEX, TROPONINI in the last 168 hours. BNP (last 3 results) No results for input(s): PROBNP in the last 8760 hours. HbA1C: No results for input(s): HGBA1C in the last 72 hours. CBG: Recent Labs  Lab 02/28/18 0744  GLUCAP 91   Lipid Profile: No results for input(s): CHOL, HDL,  LDLCALC, TRIG, CHOLHDL, LDLDIRECT in the last 72 hours. Thyroid Function Tests: No results for input(s): TSH, T4TOTAL, FREET4, T3FREE, THYROIDAB in the last 72 hours. Anemia Panel: No results for input(s): VITAMINB12, FOLATE, FERRITIN, TIBC, IRON, RETICCTPCT in the last 72 hours.    Radiology Studies: I have reviewed all of the imaging during this hospital visit personally     Scheduled Meds: . atorvastatin  10 mg Oral q1800   Continuous Infusions: . dextrose 5% lactated ringers 75 mL/hr at 02/28/18 1543  . famotidine (PEPCID) IV       LOS: 0 days        Tawni Millers, MD [facility] Pager [phone]

## 2018-02-28 NOTE — ED Notes (Signed)
HIV antibody to be collected at 5am with 5am lab drawl
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
## 2018-03-01 DIAGNOSIS — K920 Hematemesis: Secondary | ICD-10-CM | POA: Diagnosis not present

## 2018-03-01 DIAGNOSIS — K922 Gastrointestinal hemorrhage, unspecified: Secondary | ICD-10-CM | POA: Diagnosis not present

## 2018-03-01 DIAGNOSIS — D500 Iron deficiency anemia secondary to blood loss (chronic): Secondary | ICD-10-CM | POA: Diagnosis not present

## 2018-03-01 DIAGNOSIS — E785 Hyperlipidemia, unspecified: Secondary | ICD-10-CM | POA: Diagnosis not present

## 2018-03-01 DIAGNOSIS — D62 Acute posthemorrhagic anemia: Secondary | ICD-10-CM | POA: Diagnosis not present

## 2018-03-01 LAB — CBC WITH DIFFERENTIAL/PLATELET
BASOS PCT: 0 %
Basophils Absolute: 0 10*3/uL (ref 0.0–0.1)
EOS ABS: 0.1 10*3/uL (ref 0.0–0.7)
Eosinophils Relative: 2 %
HCT: 40.3 % (ref 36.0–46.0)
HEMOGLOBIN: 13 g/dL (ref 12.0–15.0)
Lymphocytes Relative: 41 %
Lymphs Abs: 2.5 10*3/uL (ref 0.7–4.0)
MCH: 31.7 pg (ref 26.0–34.0)
MCHC: 32.3 g/dL (ref 30.0–36.0)
MCV: 98.3 fL (ref 78.0–100.0)
MONO ABS: 0.6 10*3/uL (ref 0.1–1.0)
MONOS PCT: 10 %
NEUTROS PCT: 47 %
Neutro Abs: 2.9 10*3/uL (ref 1.7–7.7)
Platelets: 240 10*3/uL (ref 150–400)
RBC: 4.1 MIL/uL (ref 3.87–5.11)
RDW: 13.2 % (ref 11.5–15.5)
WBC: 6 10*3/uL (ref 4.0–10.5)

## 2018-03-01 LAB — GLUCOSE, CAPILLARY: GLUCOSE-CAPILLARY: 110 mg/dL — AB (ref 65–99)

## 2018-03-01 MED ORDER — ACETAMINOPHEN 500 MG PO TABS: 500.0000 mg | ORAL_TABLET | Freq: Four times a day (QID) | ORAL | 0 refills | Status: DC | PRN

## 2018-03-01 NOTE — Progress Notes (Signed)
Patient's case discussed with the hospital Dr. 2 and I set her up for outpatient on Monday morning at 8 AM colonoscopy unfortunately her and her sister were mad at me and she does not remember our discussion about diverticuli as the probable cause nor did they understand that we had no anesthesia assistance and endoscopy today despite my conversation yesterday and they have asked me not to be her Dr. And I have explained that to the hospital team and I wished her well and apologized for the confusion a misunderstanding but agree with discharge since no further bleeding and hemoglobin stable

## 2018-03-01 NOTE — Progress Notes (Signed)
Patient is stable for discharge. Discharge instructions and medications have been reviewed with the patient and all questions answered. AVS and given to patient. Patient stated that she was not going to stop taking her Celebrex as per the MD's recommendations. Patient was educated that the MD stopped this medication d/t the increased risk of bleeding and she was admitted with c/o GI bleed.

## 2018-03-01 NOTE — Progress Notes (Signed)
I spoke with Dr. Watt Climes, and I did call the gastroenterology [place] office for an outpatient appointment, I was informed that patient will have to request [facility] gastroenterology office to send records to the [facility] office before scheduling appointment.  Patient was informed about this procedure.

## 2018-03-01 NOTE — Progress Notes (Signed)
Patient and her sister expressed that they are unhappy with the care they have received. They stated to the RN that they were told that the patient would "be admitted to run tests and have a colonoscopy but nothing has been done. We have been here for two days with no treatment." Wanting to speak to the house supervisor and the hospitalist this morning. RN made the MD and the house supervisor aware of the patient's requests. Will continue to offer emotional support.   Othella Boyer [facility] 03/01/2018 8:55 AM

## 2018-03-01 NOTE — Discharge Summary (Signed)
Physician Discharge Summary  Andrea Kline [id number]:[id number] DOB: 19-Dec-1955 DOA: 02/28/2018  PCP: [name], MD  Admit date: 02/28/2018 Discharge date: 03/01/2018  Admitted From: Home Disposition:  Home  Recommendations for Outpatient Follow-up and new medication changes:  1. Follow up with PCP in 1- week 2. Outpatient colonoscopy next week  Home Health: no   Equipment/Devices: no    Discharge Condition: stable CODE STATUS: full  Diet recommendation: Regular  Brief/Interim Summary: 62 year old female who presented with rectal bleeding.  She does have the significant past medical history for hyperlipidemia, and GERD.  Patient had 5 episodes of bright red blood per rectum, about a bowl full, with no orthostatic symptoms.  On the initial physical examination blood pressure 150/78, heart rate 93, respirate rate 16, temperature 98.1, oxygen saturation 100%.  Moist mucous membranes, lungs clear to auscultation, heart S1-S2 present rhythmic, the abdomen soft nontender, no lower extremity edema.  Sodium 141, potassium 4.4, chloride 108, bicarb 23, glucose 89, BUN 15, creatinine 0.97, white count 11.0, hemoglobin 15.1, hematocrit 44.5, platelets 264.  Fecal occult blood was positive.  Patient was admitted to the hospital working diagnosis of rectal bleeding.   1.  Hematochezia due to diverticular bleed.  Patient was admitted to the medical ward, she was placed on a remote telemetry monitor, no further signs of bleeding, her hemoglobin and hematocrit remained stable, likely consistent with a diverticular bleed.  Discharge hemoglobin 13.0, hematocrit 40.3.  Patient was seen by gastroenterology with recommendations for outpatient colonoscopy.   I explained the patient about her working diagnosis, no further signs of bleeding, no life-threatening nature of gastrointestinal bleed, patient is safe to be discharged with prompt follow-up with colonoscopy next week.  All questions were addressed.  2.   Dyslipidemia.  Patient was resumed on statin therapy with good toleration.  3.  Anxiety and depression.  Patient was continued on diazepam.  No confusion or agitation.    Discharge Diagnoses:  Principal Problem:   Acute GI bleeding Active Problems:   HLD (hyperlipidemia)   History of colon polyps    Discharge Instructions   Allergies as of 03/01/2018      Reactions   Advair Diskus [fluticasone-salmeterol]    Can't breath/coughing   Codeine    dizzy      Medication List    STOP taking these medications   celecoxib 200 MG capsule Commonly known as:  CELEBREX     TAKE these medications   acetaminophen 500 MG tablet Commonly known as:  TYLENOL Take 1 tablet (500 mg total) by mouth every 6 (six) hours as needed for moderate pain or headache. What changed:  how much to take   ALKA-SELTZER PLUS COLD PO Take 2 tablets by mouth daily as needed (congestion).   atorvastatin 10 MG tablet Commonly known as:  LIPITOR Take 10 mg by mouth daily.   diazepam 10 MG tablet Commonly known as:  VALIUM Take 5-10 mg by mouth daily as needed (vertigo).   escitalopram 10 MG tablet Commonly known as:  LEXAPRO Take 10 mg by mouth daily.   MULTIVITAMIN PO Take 1 tablet by mouth daily.   omeprazole 40 MG capsule Commonly known as:  PRILOSEC Take 40 mg by mouth daily.   VISINE OP Apply 1 drop to eye daily as needed (dry eyes).   Vitamin D3 1000 units Caps Take 1,000 Units by mouth daily.   ZZZQUIL 25 MG Caps Generic drug:  diphenhydrAMINE HCl (Sleep) Take 50 mg by mouth at bedtime as  needed (sleep).       Allergies  Allergen Reactions  . Advair Diskus [Fluticasone-Salmeterol]     Can't breath/coughing  . Codeine     dizzy    Consultations:  Gastroenterology    Procedures/Studies:  No results found.    Subjective: Patient is feeling better, no further bleeding, no nausea no vomiting, tolerating p.o. diet adequately.   Discharge Exam: Vitals:   02/28/18  2117 03/01/18 0519  BP: 132/90 (!) 142/80  Pulse: 75 78  Resp:  16  Temp: 98 F (36.7 C) 98.1 F (36.7 C)  SpO2: 96% 97%   Vitals:   02/28/18 1500 02/28/18 1537 02/28/18 2117 03/01/18 0519  BP:  137/73 132/90 (!) 142/80  Pulse:  72 75 78  Resp:  18  16  Temp:  98 F (36.7 C) 98 F (36.7 C) 98.1 F (36.7 C)  TempSrc:  Oral Oral Oral  SpO2:  98% 96% 97%  Weight: 80.7 kg (177 lb 14.4 oz)     Height: 5\' 5"  (1.651 m)       General: Not in pain or dyspnea  Neurology: Awake and alert, non focal  E ENT: no pallor, no icterus, oral mucosa moist Cardiovascular: No JVD. S1-S2 present, rhythmic, no gallops, rubs, or murmurs. No lower extremity edema. Pulmonary: vesicular breath sounds bilaterally, adequate air movement, no wheezing, rhonchi or rales. Gastrointestinal. Abdomen with no organomegaly, non tender, no rebound or guarding Skin. No rashes Musculoskeletal: no joint deformities   The results of significant diagnostics from this hospitalization (including imaging, microbiology, ancillary and laboratory) are listed below for reference.     Microbiology: No results found for this or any previous visit (from the past 240 hour(s)).   Labs: BNP (last 3 results) No results for input(s): BNP in the last 8760 hours. Basic Metabolic Panel: Recent Labs  Lab 02/27/18 1957 02/28/18 0451  NA 141 140  K 4.4 3.7  CL 108 113*  CO2 23 20*  GLUCOSE 89 90  BUN 15 11  CREATININE 0.97 0.73  CALCIUM 9.7 8.5*   Liver Function Tests: Recent Labs  Lab 02/27/18 1957  AST 20  ALT 18  ALKPHOS 137*  BILITOT 0.7  PROT 7.6  ALBUMIN 4.5   No results for input(s): LIPASE, AMYLASE in the last 168 hours. No results for input(s): AMMONIA in the last 168 hours. CBC: Recent Labs  Lab 02/27/18 1957 02/28/18 0451 02/28/18 0741 02/28/18 1210 03/01/18 0410  WBC 11.0* 11.9* 8.8 6.7 6.0  NEUTROABS  --   --   --   --  2.9  HGB 15.1* 12.8 12.3 12.6 13.0  HCT 44.5 39.1 37.5 38.5 40.3   MCV 98.5 98.2 97.9 98.5 98.3  PLT 264 252 223 231 240   Cardiac Enzymes: No results for input(s): CKTOTAL, CKMB, CKMBINDEX, TROPONINI in the last 168 hours. BNP: Invalid input(s): POCBNP CBG: Recent Labs  Lab 02/28/18 0744 02/28/18 1820 02/28/18 2333 03/01/18 0748  GLUCAP 91 94 111* 110*   D-Dimer No results for input(s): DDIMER in the last 72 hours. Hgb A1c No results for input(s): HGBA1C in the last 72 hours. Lipid Profile No results for input(s): CHOL, HDL, LDLCALC, TRIG, CHOLHDL, LDLDIRECT in the last 72 hours. Thyroid function studies No results for input(s): TSH, T4TOTAL, T3FREE, THYROIDAB in the last 72 hours.  Invalid input(s): FREET3 Anemia work up No results for input(s): VITAMINB12, FOLATE, FERRITIN, TIBC, IRON, RETICCTPCT in the last 72 hours. Urinalysis    Component Value Date/Time  COLORURINE YELLOW 04/06/2014 0201   APPEARANCEUR CLEAR 04/06/2014 0201   LABSPEC 1.005 04/06/2014 0201   PHURINE 6.0 04/06/2014 0201   GLUCOSEU NEGATIVE 04/06/2014 0201   HGBUR MODERATE (A) 04/06/2014 0201   BILIRUBINUR NEGATIVE 04/06/2014 0201   KETONESUR NEGATIVE 04/06/2014 0201   PROTEINUR NEGATIVE 04/06/2014 0201   UROBILINOGEN 0.2 04/06/2014 0201   NITRITE NEGATIVE 04/06/2014 0201   LEUKOCYTESUR NEGATIVE 04/06/2014 0201   Sepsis Labs Invalid input(s): PROCALCITONIN,  WBC,  LACTICIDVEN Microbiology No results found for this or any previous visit (from the past 240 hour(s)).   Time coordinating discharge: 45 minutes  SIGNED:   Tawni Millers, MD  [facility] 03/01/2018, 9:34 AM Pager [phone]  If 7PM-7AM, please contact night-coverage [email or web address] Password [email or web address]

## 2018-03-04 ENCOUNTER — Other Ambulatory Visit: Payer: Self-pay | Admitting: Geriatric Medicine

## 2018-03-04 ENCOUNTER — Encounter: Payer: Self-pay | Admitting: Nurse Practitioner

## 2018-03-04 DIAGNOSIS — R911 Solitary pulmonary nodule: Secondary | ICD-10-CM

## 2018-03-06 DIAGNOSIS — F439 Reaction to severe stress, unspecified: Secondary | ICD-10-CM | POA: Diagnosis not present

## 2018-03-06 DIAGNOSIS — K922 Gastrointestinal hemorrhage, unspecified: Secondary | ICD-10-CM | POA: Diagnosis not present

## 2018-03-06 DIAGNOSIS — Z0000 Encounter for general adult medical examination without abnormal findings: Secondary | ICD-10-CM | POA: Diagnosis not present

## 2018-03-19 ENCOUNTER — Ambulatory Visit
Admission: RE | Admit: 2018-03-19 | Discharge: 2018-03-19 | Disposition: A | Discharge status: Home / Self Care | Payer: PPO | Source: Ambulatory Visit | Attending: Geriatric Medicine | Admitting: Geriatric Medicine

## 2018-03-19 ENCOUNTER — Other Ambulatory Visit: Payer: PPO

## 2018-03-19 DIAGNOSIS — R911 Solitary pulmonary nodule: Secondary | ICD-10-CM

## 2018-03-19 DIAGNOSIS — J45901 Unspecified asthma with (acute) exacerbation: Secondary | ICD-10-CM | POA: Diagnosis not present

## 2018-03-19 DIAGNOSIS — F17210 Nicotine dependence, cigarettes, uncomplicated: Secondary | ICD-10-CM | POA: Diagnosis not present

## 2018-03-22 ENCOUNTER — Ambulatory Visit: Payer: PPO | Admitting: Nurse Practitioner

## 2018-04-05 DIAGNOSIS — R197 Diarrhea, unspecified: Secondary | ICD-10-CM | POA: Diagnosis not present

## 2018-04-05 DIAGNOSIS — F4329 Adjustment disorder with other symptoms: Secondary | ICD-10-CM | POA: Diagnosis not present

## 2018-04-05 DIAGNOSIS — R5383 Other fatigue: Secondary | ICD-10-CM | POA: Diagnosis not present

## 2018-04-05 DIAGNOSIS — R911 Solitary pulmonary nodule: Secondary | ICD-10-CM | POA: Diagnosis not present

## 2018-04-10 ENCOUNTER — Ambulatory Visit: Payer: PPO | Admitting: Pulmonary Disease

## 2018-04-10 ENCOUNTER — Encounter: Payer: Self-pay | Admitting: Pulmonary Disease

## 2018-04-10 DIAGNOSIS — J432 Centrilobular emphysema: Secondary | ICD-10-CM

## 2018-04-10 DIAGNOSIS — I2510 Atherosclerotic heart disease of native coronary artery without angina pectoris: Secondary | ICD-10-CM | POA: Diagnosis not present

## 2018-04-10 DIAGNOSIS — R911 Solitary pulmonary nodule: Secondary | ICD-10-CM | POA: Diagnosis not present
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
## 2018-04-10 NOTE — Patient Instructions (Signed)
PET scan in 4 months. Schedule PFTs

## 2018-04-10 NOTE — Assessment & Plan Note (Signed)
Risk factor modification was discussed including smoking cessation and control of hyperlipidemia. Low threshold to risk stratify by stress test if she develops symptoms

## 2018-04-10 NOTE — Assessment & Plan Note (Signed)
We will also obtain PFTs to coronary lung function and to evaluate for resectability

## 2018-04-10 NOTE — Assessment & Plan Note (Signed)
Although this was not noted to be hypermetabolic in 01/5700, it has increased in size and that is suspicious for malignancy. I would recommend repeating a PET scan, earlier PET scan could have been negative due to low size of nodule.  We will repeat PET scan in 4 months to allow for interim evaluation. She is high risk due to her history of smoking and family history of lung cancer

## 2018-04-24 ENCOUNTER — Institutional Professional Consult (permissible substitution): Payer: PPO | Admitting: Pulmonary Disease

## 2018-05-06 DIAGNOSIS — R911 Solitary pulmonary nodule: Secondary | ICD-10-CM | POA: Diagnosis not present

## 2018-05-06 DIAGNOSIS — F17210 Nicotine dependence, cigarettes, uncomplicated: Secondary | ICD-10-CM | POA: Diagnosis not present

## 2018-05-06 DIAGNOSIS — F325 Major depressive disorder, single episode, in full remission: Secondary | ICD-10-CM | POA: Diagnosis not present

## 2018-05-06 DIAGNOSIS — G4700 Insomnia, unspecified: Secondary | ICD-10-CM | POA: Diagnosis not present

## 2018-05-06 DIAGNOSIS — F4329 Adjustment disorder with other symptoms: Secondary | ICD-10-CM | POA: Diagnosis not present

## 2018-07-29 ENCOUNTER — Other Ambulatory Visit: Payer: Self-pay

## 2018-07-29 DIAGNOSIS — R911 Solitary pulmonary nodule: Secondary | ICD-10-CM

## 2018-08-13 DIAGNOSIS — Z23 Encounter for immunization: Secondary | ICD-10-CM | POA: Diagnosis not present

## 2018-08-13 DIAGNOSIS — R040 Epistaxis: Secondary | ICD-10-CM | POA: Diagnosis not present

## 2018-08-13 DIAGNOSIS — R229 Localized swelling, mass and lump, unspecified: Secondary | ICD-10-CM | POA: Diagnosis not present

## 2018-08-13 DIAGNOSIS — M2550 Pain in unspecified joint: Secondary | ICD-10-CM | POA: Diagnosis not present

## 2018-09-04 ENCOUNTER — Encounter (HOSPITAL_COMMUNITY)
Admission: RE | Admit: 2018-09-04 | Discharge: 2018-09-04 | Disposition: A | Discharge status: Home / Self Care | Payer: PPO | Source: Ambulatory Visit | Attending: Pulmonary Disease | Admitting: Pulmonary Disease

## 2018-09-04 ENCOUNTER — Telehealth: Payer: Self-pay | Admitting: Pulmonary Disease

## 2018-09-04 DIAGNOSIS — R911 Solitary pulmonary nodule: Secondary | ICD-10-CM | POA: Diagnosis not present

## 2018-09-04 LAB — GLUCOSE, CAPILLARY: Glucose-Capillary: 98 mg/dL (ref 70–99)

## 2018-09-04 MED ORDER — FLUDEOXYGLUCOSE F - 18 (FDG) INJECTION
9.0200 | Freq: Once | INTRAVENOUS | Status: AC
  Administered 2018-09-04: 9.02 via INTRAVENOUS

## 2018-09-04 NOTE — Telephone Encounter (Signed)
Spoke with Opal Sidles with [facility] @ 1240 to receive call report on 09/04/18 PET scan  Dr Elsworth Soho please review the impression copied from the 09/04/18 PET scan:  IMPRESSION: 1. The enlarging left lower lobe pulmonary nodule is not significantly hypermetabolic. This remains suspicious for an indolent primary bronchogenic carcinoma. 2. No thoracic nodal or extrathoracic hypermetabolic metastasis identified 3. Age advanced coronary artery atherosclerosis. Recommend assessment of coronary risk factors and consideration of medical therapy. 4. Mild hepatic steatosis. Possible mild cirrhosis. Correlate with risk factors. 5. Colonic diverticulosis with new pericolonic edema surrounding the distal descending colon. Suspicious for non complicated diverticulitis. 6.  Aortic Atherosclerosis (ICD10-I70.0).

## 2018-09-05 NOTE — Telephone Encounter (Signed)
Please let patient know that nodule did not light up on PET scan and this is good news. Please schedule appointment to discuss next step

## 2018-09-05 NOTE — Telephone Encounter (Signed)
Spoke with pt. She is aware of her results. OV has been scheduled for 10/02/18 at 12pm. Nothing further was needed.

## 2018-10-02 ENCOUNTER — Ambulatory Visit: Payer: PPO | Admitting: Pulmonary Disease

## 2018-10-22 ENCOUNTER — Encounter: Payer: Self-pay | Admitting: Pulmonary Disease

## 2018-10-22 ENCOUNTER — Ambulatory Visit (INDEPENDENT_AMBULATORY_CARE_PROVIDER_SITE_OTHER): Payer: PPO | Admitting: Pulmonary Disease

## 2018-10-22 DIAGNOSIS — K5790 Diverticulosis of intestine, part unspecified, without perforation or abscess without bleeding: Secondary | ICD-10-CM | POA: Insufficient documentation

## 2018-10-22 DIAGNOSIS — R911 Solitary pulmonary nodule: Secondary | ICD-10-CM

## 2018-10-22 DIAGNOSIS — Z720 Tobacco use: Secondary | ICD-10-CM | POA: Insufficient documentation

## 2018-10-22 DIAGNOSIS — J432 Centrilobular emphysema: Secondary | ICD-10-CM | POA: Diagnosis not present

## 2018-10-22 NOTE — Assessment & Plan Note (Signed)
Schedule PFTs Albuterol prn

## 2018-10-22 NOTE — Assessment & Plan Note (Signed)
Start on nicotine patch 21 mg daily.  This should help you cut down on smoking Call us in 1 month for prescription of Nicotrol Inhaler

## 2018-10-22 NOTE — Assessment & Plan Note (Signed)
CT chest without contrast in April

## 2018-10-22 NOTE — Assessment & Plan Note (Signed)
She will get GI referral from PCP

## 2018-10-22 NOTE — Patient Instructions (Addendum)
CT chest without contrast in April   Schedule PFTs  Start baby aspirin daily  Start on nicotine patch 21 mg daily.  This should help you cut down on smoking Call us in 1 month for prescription of Nicotrol Inhaler
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
## 2018-10-22 NOTE — Assessment & Plan Note (Signed)
Start baby aspirin daily Risk factor modification main focus, turned on cardiology referral

## 2018-11-07 ENCOUNTER — Telehealth: Payer: Self-pay | Admitting: Pulmonary Disease

## 2018-11-07 NOTE — Telephone Encounter (Signed)
Spirometry 12/2016  nml FEV1 89% ( with dr Felipa Eth)

## 2018-11-08 ENCOUNTER — Other Ambulatory Visit: Payer: Self-pay | Admitting: Geriatric Medicine

## 2018-11-08 ENCOUNTER — Other Ambulatory Visit (HOSPITAL_COMMUNITY)
Admission: RE | Admit: 2018-11-08 | Discharge: 2018-11-08 | Disposition: A | Discharge status: Home / Self Care | Payer: PPO | Source: Ambulatory Visit | Attending: Geriatric Medicine | Admitting: Geriatric Medicine

## 2018-11-08 DIAGNOSIS — D126 Benign neoplasm of colon, unspecified: Secondary | ICD-10-CM | POA: Diagnosis not present

## 2018-11-08 DIAGNOSIS — Z79899 Other long term (current) drug therapy: Secondary | ICD-10-CM | POA: Diagnosis not present

## 2018-11-08 DIAGNOSIS — I700 Atherosclerosis of aorta: Secondary | ICD-10-CM | POA: Diagnosis not present

## 2018-11-08 DIAGNOSIS — E039 Hypothyroidism, unspecified: Secondary | ICD-10-CM | POA: Diagnosis not present

## 2018-11-08 DIAGNOSIS — Z0000 Encounter for general adult medical examination without abnormal findings: Secondary | ICD-10-CM | POA: Diagnosis not present

## 2018-11-08 DIAGNOSIS — R911 Solitary pulmonary nodule: Secondary | ICD-10-CM | POA: Diagnosis not present

## 2018-11-08 DIAGNOSIS — Z01411 Encounter for gynecological examination (general) (routine) with abnormal findings: Secondary | ICD-10-CM | POA: Insufficient documentation

## 2018-11-08 DIAGNOSIS — E7800 Pure hypercholesterolemia, unspecified: Secondary | ICD-10-CM | POA: Diagnosis not present

## 2018-11-08 DIAGNOSIS — H8113 Benign paroxysmal vertigo, bilateral: Secondary | ICD-10-CM | POA: Diagnosis not present

## 2018-11-08 DIAGNOSIS — K219 Gastro-esophageal reflux disease without esophagitis: Secondary | ICD-10-CM | POA: Diagnosis not present

## 2018-11-08 DIAGNOSIS — F17210 Nicotine dependence, cigarettes, uncomplicated: Secondary | ICD-10-CM | POA: Diagnosis not present

## 2018-11-08 DIAGNOSIS — F325 Major depressive disorder, single episode, in full remission: Secondary | ICD-10-CM | POA: Diagnosis not present

## 2018-11-13 LAB — CYTOLOGY - PAP
DIAGNOSIS: NEGATIVE
HPV (WINDOPATH): NOT DETECTED

## 2019-01-21 ENCOUNTER — Telehealth: Payer: Self-pay | Admitting: Pulmonary Disease

## 2019-01-21 NOTE — Telephone Encounter (Signed)
Okay with me to cancel our order and her PCP can order

## 2019-01-21 NOTE — Telephone Encounter (Signed)
Called and spoke with patient, she stated that RA was wanting her to have a CT scan however her PCP can order them for her due to she gets a free one every year from him and how he orders it. She is afraid that if her insurance sees that another MD has ordered another CT then the other will not be free.   RA please advise if it is ok to cancel the CT scan for 02/12/19

## 2019-01-22 NOTE — Telephone Encounter (Signed)
Patient is aware that it is ok for PCP to order. Will cancel our order. Nothing further needed. Will close encounter.

## 2019-02-12 ENCOUNTER — Other Ambulatory Visit: Payer: PPO

## 2019-04-24 ENCOUNTER — Other Ambulatory Visit: Payer: Self-pay | Admitting: Geriatric Medicine

## 2019-04-24 DIAGNOSIS — R911 Solitary pulmonary nodule: Secondary | ICD-10-CM

## 2019-06-30 ENCOUNTER — Telehealth: Payer: Self-pay | Admitting: Pulmonary Disease

## 2019-06-30 NOTE — Telephone Encounter (Signed)
Called and spoke to patient. I let her know the order for the CT was scheduled under Dr. Felipa Eth and they would most likely call with the results, but if Dr. Elsworth Soho received them we would surely call with the results.  Patient knew it was ordered by Dr. Felipa Eth and verbalized understanding. Nothing further needed at this time

## 2019-07-07 ENCOUNTER — Ambulatory Visit
Admission: RE | Admit: 2019-07-07 | Discharge: 2019-07-07 | Disposition: A | Discharge status: Home / Self Care | Payer: PPO | Source: Ambulatory Visit | Attending: Geriatric Medicine | Admitting: Geriatric Medicine

## 2019-07-07 DIAGNOSIS — J432 Centrilobular emphysema: Secondary | ICD-10-CM | POA: Diagnosis not present

## 2019-07-07 DIAGNOSIS — R911 Solitary pulmonary nodule: Secondary | ICD-10-CM

## 2019-07-07 DIAGNOSIS — R918 Other nonspecific abnormal finding of lung field: Secondary | ICD-10-CM | POA: Diagnosis not present

## 2019-07-11 ENCOUNTER — Ambulatory Visit: Payer: PPO | Admitting: Adult Health

## 2019-07-11 ENCOUNTER — Other Ambulatory Visit: Payer: Self-pay

## 2019-07-11 ENCOUNTER — Encounter: Payer: Self-pay | Admitting: Adult Health

## 2019-07-11 ENCOUNTER — Ambulatory Visit (INDEPENDENT_AMBULATORY_CARE_PROVIDER_SITE_OTHER): Payer: PPO | Admitting: Adult Health

## 2019-07-11 DIAGNOSIS — J432 Centrilobular emphysema: Secondary | ICD-10-CM | POA: Diagnosis not present

## 2019-07-11 DIAGNOSIS — R911 Solitary pulmonary nodule: Secondary | ICD-10-CM | POA: Diagnosis not present

## 2019-07-11 NOTE — Patient Instructions (Signed)
Set up PET scan.  Follow up with Dr. Elsworth Soho  In 2 weeks with PFT  Work on cutting down on smoking
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
## 2019-07-11 NOTE — Assessment & Plan Note (Signed)
Enlarging left lower lobe lung nodule now measuring 1.7 cm.  Suspicious for underlying lung cancer. PET scan has been ordered.  Patient will need to undergo tissue sampling with biopsy.  Once PET is done we will consider what type of biopsy to proceed with.  Does have a peripheral lesion however new lingular nodule may need navigational approach. Check PFTs on return as patient is fully independent not on oxygen and without metastatic disease might be a candidate for resection  Plan  Patient Instructions  Set up PET scan.  Follow up with Dr. Elsworth Soho  In 2 weeks with PFT  Work on cutting down on smoking    '

## 2019-07-11 NOTE — Assessment & Plan Note (Signed)
Check PFTs on return

## 2019-07-15 ENCOUNTER — Telehealth: Payer: Self-pay | Admitting: Adult Health

## 2019-07-15 ENCOUNTER — Encounter: Payer: Self-pay | Admitting: Adult Health

## 2019-07-15 DIAGNOSIS — R911 Solitary pulmonary nodule: Secondary | ICD-10-CM

## 2019-07-15 NOTE — Telephone Encounter (Signed)
Spoke with pt and she states she saw TP on Friday and wanted to update her medical history. I updated the medical history by adding additional diagnosis that we discussed (GI bleed, diverticulitis). She wanted to know if someone could call her with her PET results on Friday or Sat. I advised her that we would have to wait until the radiologist reads it and I doubt it would be done by the end of the day Friday. She doesn't want to wait the whole weekend to get her results.  She also wanted to ask why she needed the PFT? Was this for the biopsy or surgery that she would need this test. She explained that depending on the results she may not do anything about it because she doesn't want the resection surgery. She is ok with doing the biopsy but no surgery. Please advise.

## 2019-07-16 NOTE — Telephone Encounter (Signed)
Called the patient and made her aware of the response received regarding the PET notification and PFT to be ordered. Advised the patient that she will be contact by our office to schedule the PFT (was already ordered) and the required COVID screen needed prior. She confirmed she knows where the [facility] location is when advised when to go.  Patient also wanted to say thank you to [facility] for her willingness to contact her after the test has been done even after hours. Stated she really appreciates it.  Nothing further needed at this time.

## 2019-07-16 NOTE — Telephone Encounter (Signed)
Patient is returning phone call.  Patient phone number is [phone].

## 2019-07-16 NOTE — Telephone Encounter (Signed)
Per TP: yes, I will call her after hours with results if they are not back before end of day tomorrow.  Also, she needs the PFT so that we can assess her lung function.  Thanks.  LMOM TCB x1.

## 2019-07-18 ENCOUNTER — Other Ambulatory Visit: Payer: Self-pay

## 2019-07-18 ENCOUNTER — Ambulatory Visit (HOSPITAL_COMMUNITY)
Admission: RE | Admit: 2019-07-18 | Discharge: 2019-07-18 | Disposition: A | Discharge status: Home / Self Care | Payer: PPO | Source: Ambulatory Visit | Attending: Adult Health | Admitting: Adult Health

## 2019-07-18 DIAGNOSIS — R911 Solitary pulmonary nodule: Secondary | ICD-10-CM

## 2019-07-18 DIAGNOSIS — I700 Atherosclerosis of aorta: Secondary | ICD-10-CM | POA: Diagnosis not present

## 2019-07-18 DIAGNOSIS — I2510 Atherosclerotic heart disease of native coronary artery without angina pectoris: Secondary | ICD-10-CM | POA: Insufficient documentation

## 2019-07-18 DIAGNOSIS — J439 Emphysema, unspecified: Secondary | ICD-10-CM | POA: Diagnosis not present

## 2019-07-18 LAB — GLUCOSE, CAPILLARY: Glucose-Capillary: 100 mg/dL — ABNORMAL HIGH (ref 70–99)

## 2019-07-18 MED ORDER — FLUDEOXYGLUCOSE F - 18 (FDG) INJECTION
7.9000 | Freq: Once | INTRAVENOUS | Status: AC | PRN
  Administered 2019-07-18: 13:00:00 7.9 via INTRAVENOUS

## 2019-07-24 ENCOUNTER — Telehealth: Payer: Self-pay | Admitting: Adult Health

## 2019-07-24 NOTE — Addendum Note (Signed)
Addended by: Nena Polio on: 07/24/2019 05:12 PM   Modules accepted: Orders

## 2019-07-24 NOTE — Telephone Encounter (Signed)
Please refer to Thoracic surgery -PET positive lingula nodule and 1.6 cm LLL nodule   Case discussed at [facility] recommended referral to surgery for evaluation of resection per [facility] coordinator /Dr. Elsworth Soho

## 2019-07-24 NOTE — Telephone Encounter (Signed)
Called spoke with patient who reported that she has spoken with TP and had her questions answered.    Patient's case is going to be discussed at [facility].

## 2019-07-24 NOTE — Telephone Encounter (Signed)
Referral placed as requested. Nothing further needed at this time.

## 2019-07-25 ENCOUNTER — Telehealth: Payer: Self-pay | Admitting: Pulmonary Disease

## 2019-07-25 NOTE — Telephone Encounter (Signed)
Called pt and advised message from the provider. Pt understood and verbalized understanding. Nothing further is needed.    

## 2019-07-25 NOTE — Telephone Encounter (Signed)
Yes she can get her flu shot , great time to get while she is well .  Glad she got in so quickly with thoracic surgery

## 2019-07-25 NOTE — Telephone Encounter (Signed)
Call returned to patient, she reports she wanted to ask TP if she could get a flu shot Tuesday before she see's Dr. Roxan Hockey and whether or not it was too soon to get her flu shot in general. She reports she was advised by someone that it may be too early.   I made her aware that I would get this message to TP however being that it is not a urgent message it may not be addressed until Monday. Patient states she perfectly okay with this.   TP please advise, patient wants to know is it too early to get her flu shot?

## 2019-07-29 ENCOUNTER — Encounter: Payer: Self-pay | Admitting: Thoracic Surgery (Cardiothoracic Vascular Surgery)

## 2019-07-29 ENCOUNTER — Other Ambulatory Visit: Payer: Self-pay

## 2019-07-29 ENCOUNTER — Institutional Professional Consult (permissible substitution): Payer: PPO | Admitting: Thoracic Surgery (Cardiothoracic Vascular Surgery)

## 2019-07-29 ENCOUNTER — Other Ambulatory Visit: Payer: Self-pay | Admitting: Thoracic Surgery (Cardiothoracic Vascular Surgery)

## 2019-07-29 VITALS — BP 124/85 | HR 119 | Temp 99.5°F | Resp 16 | Ht 65.0 in | Wt 179.0 lb

## 2019-07-29 DIAGNOSIS — R911 Solitary pulmonary nodule: Secondary | ICD-10-CM | POA: Diagnosis not present

## 2019-07-29 DIAGNOSIS — Z23 Encounter for immunization: Secondary | ICD-10-CM | POA: Diagnosis not present
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
## 2019-07-30 ENCOUNTER — Encounter: Payer: Self-pay | Admitting: *Deleted

## 2019-07-30 ENCOUNTER — Other Ambulatory Visit: Payer: Self-pay | Admitting: *Deleted

## 2019-07-30 DIAGNOSIS — R911 Solitary pulmonary nodule: Secondary | ICD-10-CM

## 2019-07-31 ENCOUNTER — Telehealth: Payer: Self-pay | Admitting: Pulmonary Disease

## 2019-07-31 NOTE — Telephone Encounter (Signed)
Returned call to patient for more detail: She wants a call back from TP. Made aware [name] is in clinic today but will return call. She was referred to Dr. Roxan Hockey and says she needs a pep talk. She is not sure she wants to proceed with any treatment; not sure it's even worth it.   Parrett, Andrea Mu, Andrea Kline     4:37 PM Note   Please refer to Thoracic surgery -PET positive lingula nodule and 1.6 cm LLL nodule   Case discussed at [facility] recommended referral to surgery for evaluation of resection per [facility] coordinator /Dr. Elsworth Soho

## 2019-07-31 NOTE — Telephone Encounter (Signed)
Discussed case with her in detail . She wants to think closely about procedure before deciding .  Discussed pros and cons   Call back if further questions.

## 2019-08-07 ENCOUNTER — Other Ambulatory Visit: Payer: Self-pay | Admitting: *Deleted

## 2019-08-07 DIAGNOSIS — R911 Solitary pulmonary nodule: Secondary | ICD-10-CM

## 2019-08-12 ENCOUNTER — Telehealth: Payer: Self-pay | Admitting: Adult Health

## 2019-08-12 NOTE — Telephone Encounter (Signed)
Pt called thanking [name] Parrett for encouraging her and she further notes that she decide to get her surgery (see previous telephone note 07/31/2019) scheduled. Routing to TP as an [place].

## 2019-08-18 ENCOUNTER — Other Ambulatory Visit (HOSPITAL_COMMUNITY): Payer: PPO

## 2019-08-18 ENCOUNTER — Other Ambulatory Visit (HOSPITAL_COMMUNITY)
Admission: RE | Admit: 2019-08-18 | Discharge: 2019-08-18 | Disposition: A | Discharge status: Home / Self Care | Payer: PPO | Source: Ambulatory Visit | Attending: Thoracic Surgery (Cardiothoracic Vascular Surgery) | Admitting: Thoracic Surgery (Cardiothoracic Vascular Surgery)

## 2019-08-18 DIAGNOSIS — Z885 Allergy status to narcotic agent status: Secondary | ICD-10-CM | POA: Diagnosis not present

## 2019-08-18 DIAGNOSIS — R911 Solitary pulmonary nodule: Secondary | ICD-10-CM

## 2019-08-18 DIAGNOSIS — Z01818 Encounter for other preprocedural examination: Secondary | ICD-10-CM | POA: Diagnosis not present

## 2019-08-18 DIAGNOSIS — M109 Gout, unspecified: Secondary | ICD-10-CM | POA: Diagnosis present

## 2019-08-18 DIAGNOSIS — Z888 Allergy status to other drugs, medicaments and biological substances status: Secondary | ICD-10-CM | POA: Diagnosis not present

## 2019-08-18 DIAGNOSIS — Z20828 Contact with and (suspected) exposure to other viral communicable diseases: Secondary | ICD-10-CM | POA: Diagnosis present

## 2019-08-18 DIAGNOSIS — F329 Major depressive disorder, single episode, unspecified: Secondary | ICD-10-CM | POA: Diagnosis present

## 2019-08-18 DIAGNOSIS — Z452 Encounter for adjustment and management of vascular access device: Secondary | ICD-10-CM | POA: Diagnosis not present

## 2019-08-18 DIAGNOSIS — C3412 Malignant neoplasm of upper lobe, left bronchus or lung: Secondary | ICD-10-CM | POA: Diagnosis present

## 2019-08-18 DIAGNOSIS — J939 Pneumothorax, unspecified: Secondary | ICD-10-CM | POA: Diagnosis not present

## 2019-08-18 DIAGNOSIS — R05 Cough: Secondary | ICD-10-CM | POA: Diagnosis not present

## 2019-08-18 DIAGNOSIS — F17210 Nicotine dependence, cigarettes, uncomplicated: Secondary | ICD-10-CM | POA: Diagnosis present

## 2019-08-18 DIAGNOSIS — J984 Other disorders of lung: Secondary | ICD-10-CM | POA: Diagnosis not present

## 2019-08-18 DIAGNOSIS — Z4682 Encounter for fitting and adjustment of non-vascular catheter: Secondary | ICD-10-CM | POA: Diagnosis not present

## 2019-08-18 DIAGNOSIS — J90 Pleural effusion, not elsewhere classified: Secondary | ICD-10-CM | POA: Diagnosis not present

## 2019-08-18 DIAGNOSIS — Z809 Family history of malignant neoplasm, unspecified: Secondary | ICD-10-CM | POA: Diagnosis not present

## 2019-08-18 DIAGNOSIS — K219 Gastro-esophageal reflux disease without esophagitis: Secondary | ICD-10-CM | POA: Diagnosis present

## 2019-08-18 DIAGNOSIS — R918 Other nonspecific abnormal finding of lung field: Secondary | ICD-10-CM | POA: Diagnosis not present

## 2019-08-18 DIAGNOSIS — E7800 Pure hypercholesterolemia, unspecified: Secondary | ICD-10-CM | POA: Diagnosis present

## 2019-08-18 DIAGNOSIS — F419 Anxiety disorder, unspecified: Secondary | ICD-10-CM | POA: Diagnosis present

## 2019-08-18 DIAGNOSIS — M1990 Unspecified osteoarthritis, unspecified site: Secondary | ICD-10-CM | POA: Diagnosis present

## 2019-08-18 DIAGNOSIS — F418 Other specified anxiety disorders: Secondary | ICD-10-CM | POA: Diagnosis not present

## 2019-08-18 DIAGNOSIS — Z01812 Encounter for preprocedural laboratory examination: Secondary | ICD-10-CM | POA: Insufficient documentation

## 2019-08-18 DIAGNOSIS — G2581 Restless legs syndrome: Secondary | ICD-10-CM | POA: Diagnosis present

## 2019-08-18 DIAGNOSIS — C3432 Malignant neoplasm of lower lobe, left bronchus or lung: Secondary | ICD-10-CM | POA: Diagnosis present

## 2019-08-18 DIAGNOSIS — J9811 Atelectasis: Secondary | ICD-10-CM | POA: Diagnosis not present

## 2019-08-18 DIAGNOSIS — R079 Chest pain, unspecified: Secondary | ICD-10-CM | POA: Diagnosis not present

## 2019-08-18 DIAGNOSIS — I700 Atherosclerosis of aorta: Secondary | ICD-10-CM | POA: Diagnosis present

## 2019-08-18 DIAGNOSIS — J432 Centrilobular emphysema: Secondary | ICD-10-CM | POA: Diagnosis present

## 2019-08-18 DIAGNOSIS — E785 Hyperlipidemia, unspecified: Secondary | ICD-10-CM | POA: Diagnosis present

## 2019-08-18 DIAGNOSIS — E039 Hypothyroidism, unspecified: Secondary | ICD-10-CM | POA: Diagnosis present

## 2019-08-18 LAB — SARS CORONAVIRUS 2 (TAT 6-24 HRS): SARS Coronavirus 2: NEGATIVE

## 2019-08-18 NOTE — Pre-Procedure Instructions (Signed)
[id number] - [place], [place] [address] Phone: [phone] Fax: [phone]      Your procedure is scheduled on Thursday October 15th.  Report to [facility] Main Entrance "A" at 6:00 A.M., and check in at the Admitting office.  Call this number if you have problems the morning of surgery:  [phone]  Call [phone] if you have any questions prior to your surgery date Monday-Friday 8am-4pm    Remember:  Do not eat or drink after midnight the night before your surgery     Take these medicines the morning of surgery with A SIP OF WATER  atorvastatin (LIPITOR) escitalopram (LEXAPRO) omeprazole (PRILOSEC)  acetaminophen (TYLENOL)-if needed diazepam (VALIUM)-if needed PROAIR HFA 108 (90 Base)-if needed Tetrahydrozoline HCl (VISINE OP)-if needed  As of today, STOP taking any Aspirin (unless otherwise instructed by your surgeon), celecoxib (CELEBREX), Aleve, Naproxen, Ibuprofen, Motrin, Advil, Goody's, BC's, all herbal medications, fish oil, and all vitamins.    The Morning of Surgery  Do not wear jewelry, make-up or nail polish.  Do not wear lotions, powders, or perfumes/colognes, or deodorant  Do not shave 48 hours prior to surgery.  Men may shave face and neck.  Do not bring valuables to the hospital.  [facility] is not responsible for any belongings or valuables.  If you are a smoker, DO NOT Smoke 24 hours prior to surgery IF you wear a CPAP at night please bring your mask, tubing, and machine the morning of surgery   Remember that you must have someone to transport you home after your surgery, and remain with you for 24 hours if you are discharged the same day.   Contacts, glasses, hearing aids, dentures or bridgework may not be worn into surgery.    Leave your suitcase in the car.  After surgery it may be brought to your room.  For patients admitted to the hospital, discharge time will be determined by your  treatment team.  Patients discharged the day of surgery will not be allowed to drive home.    Special instructions:   [facility]- Preparing For Surgery  Before surgery, you can play an important role. Because skin is not sterile, your skin needs to be as free of germs as possible. You can reduce the number of germs on your skin by washing with CHG (chlorahexidine gluconate) Soap before surgery.  CHG is an antiseptic cleaner which kills germs and bonds with the skin to continue killing germs even after washing.    Oral Hygiene is also important to reduce your risk of infection.  Remember - BRUSH YOUR TEETH THE MORNING OF SURGERY WITH YOUR REGULAR TOOTHPASTE  Please do not use if you have an allergy to CHG or antibacterial soaps. If your skin becomes reddened/irritated stop using the CHG.  Do not shave (including legs and underarms) for at least 48 hours prior to first CHG shower. It is OK to shave your face.  Please follow these instructions carefully.   1. Shower the NIGHT BEFORE SURGERY and the MORNING OF SURGERY with CHG Soap.   2. If you chose to wash your hair, wash your hair first as usual with your normal shampoo.  3. After you shampoo, rinse your hair and body thoroughly to remove the shampoo.  4. Use CHG as you would any other liquid soap. You can apply CHG directly to the skin and wash gently with a scrungie or a clean washcloth.   5. Apply the  CHG Soap to your body ONLY FROM THE NECK DOWN.  Do not use on open wounds or open sores. Avoid contact with your eyes, ears, mouth and genitals (private parts). Wash Face and genitals (private parts)  with your normal soap.   6. Wash thoroughly, paying special attention to the area where your surgery will be performed.  7. Thoroughly rinse your body with warm water from the neck down.  8. DO NOT shower/wash with your normal soap after using and rinsing off the CHG Soap.  9. Pat yourself dry with a CLEAN TOWEL.  10. Wear CLEAN  PAJAMAS to bed the night before surgery, wear comfortable clothes the morning of surgery  11. Place CLEAN SHEETS on your bed the night of your first shower and DO NOT SLEEP WITH PETS.    Day of Surgery:  Do not apply any deodorants/lotions. Please shower the morning of surgery with the CHG soap  Please wear clean clothes to the hospital/surgery center.   Remember to brush your teeth WITH YOUR REGULAR TOOTHPASTE.   Please read over the following fact sheets that you were given.

## 2019-08-19 ENCOUNTER — Encounter (HOSPITAL_COMMUNITY): Payer: PPO

## 2019-08-19 ENCOUNTER — Ambulatory Visit (HOSPITAL_COMMUNITY)
Admission: RE | Admit: 2019-08-19 | Discharge: 2019-08-19 | Disposition: A | Discharge status: Home / Self Care | Payer: PPO | Source: Ambulatory Visit | Attending: Thoracic Surgery (Cardiothoracic Vascular Surgery) | Admitting: Thoracic Surgery (Cardiothoracic Vascular Surgery)

## 2019-08-19 ENCOUNTER — Other Ambulatory Visit: Payer: Self-pay

## 2019-08-19 ENCOUNTER — Encounter (HOSPITAL_COMMUNITY)
Admission: RE | Admit: 2019-08-19 | Discharge: 2019-08-19 | Disposition: A | Discharge status: Home / Self Care | Payer: PPO | Source: Ambulatory Visit | Attending: Thoracic Surgery (Cardiothoracic Vascular Surgery) | Admitting: Thoracic Surgery (Cardiothoracic Vascular Surgery)

## 2019-08-19 ENCOUNTER — Encounter (HOSPITAL_COMMUNITY): Payer: Self-pay

## 2019-08-19 DIAGNOSIS — Z01818 Encounter for other preprocedural examination: Secondary | ICD-10-CM | POA: Diagnosis not present

## 2019-08-19 DIAGNOSIS — J984 Other disorders of lung: Secondary | ICD-10-CM | POA: Diagnosis not present

## 2019-08-19 DIAGNOSIS — R911 Solitary pulmonary nodule: Secondary | ICD-10-CM

## 2019-08-19 HISTORY — DX: Personal history of urinary calculi: Z87.442

## 2019-08-19 HISTORY — DX: Dyspnea, unspecified: R06.00

## 2019-08-19 HISTORY — DX: Unspecified asthma, uncomplicated: J45.909

## 2019-08-19 HISTORY — DX: Depression, unspecified: F32.A

## 2019-08-19 HISTORY — DX: Inflammatory liver disease, unspecified: K75.9

## 2019-08-19 HISTORY — DX: Anxiety disorder, unspecified: F41.9

## 2019-08-19 HISTORY — DX: Pneumonia, unspecified organism: J18.9

## 2019-08-19 HISTORY — DX: Chronic obstructive pulmonary disease, unspecified: J44.9

## 2019-08-19 LAB — COMPREHENSIVE METABOLIC PANEL
ALT: 13 U/L (ref 0–44)
AST: 16 U/L (ref 15–41)
Albumin: 4 g/dL (ref 3.5–5.0)
Alkaline Phosphatase: 145 U/L — ABNORMAL HIGH (ref 38–126)
Anion gap: 12 (ref 5–15)
BUN: 9 mg/dL (ref 8–23)
CO2: 19 mmol/L — ABNORMAL LOW (ref 22–32)
Calcium: 9.2 mg/dL (ref 8.9–10.3)
Chloride: 107 mmol/L (ref 98–111)
Creatinine, Ser: 0.72 mg/dL (ref 0.44–1.00)
GFR calc Af Amer: >60 mL/min (ref >60)
GFR calc non Af Amer: >60 mL/min (ref >60)
Glucose, Bld: 107 mg/dL — ABNORMAL HIGH (ref 70–99)
Potassium: 4 mmol/L (ref 3.5–5.1)
Sodium: 138 mmol/L (ref 135–145)
Total Bilirubin: 0.6 mg/dL (ref 0.3–1.2)
Total Protein: 6.8 g/dL (ref 6.5–8.1)

## 2019-08-19 LAB — PULMONARY FUNCTION TEST
DL/VA % pred: 74 %
DL/VA: 3.09 ml/min/mmHg/L
DLCO unc % pred: 70 %
DLCO unc: 14.72 ml/min/mmHg
FEF 25-75 Post: 3.08 L/sec
FEF 25-75 Pre: 3.22 L/sec
FEF2575-%Change-Post: -4 %
FEF2575-%Pred-Post: 132 %
FEF2575-%Pred-Pre: 138 %
FEV1-%Change-Post: -1 %
FEV1-%Pred-Post: 101 %
FEV1-%Pred-Pre: 102 %
FEV1-Post: 2.64 L
FEV1-Pre: 2.67 L
FEV1FVC-%Change-Post: 2 %
FEV1FVC-%Pred-Pre: 106 %
FEV6-%Change-Post: -2 %
FEV6-%Pred-Post: 95 %
FEV6-%Pred-Pre: 97 %
FEV6-Post: 3.09 L
FEV6-Pre: 3.17 L
FEV6FVC-%Change-Post: 0 %
FEV6FVC-%Pred-Post: 103 %
FEV6FVC-%Pred-Pre: 102 %
FVC-%Change-Post: -3 %
FVC-%Pred-Post: 92 %
FVC-%Pred-Pre: 95 %
FVC-Post: 3.11 L
FVC-Pre: 3.22 L
Post FEV1/FVC ratio: 85 %
Post FEV6/FVC ratio: 99 %
Pre FEV1/FVC ratio: 83 %
Pre FEV6/FVC Ratio: 99 %
RV % pred: 52 %
RV: 1.09 L
TLC % pred: 80 %
TLC: 4.18 L

## 2019-08-19 LAB — CBC
HCT: 44.4 % (ref 36.0–46.0)
Hemoglobin: 14.3 g/dL (ref 12.0–15.0)
MCH: 32.4 pg (ref 26.0–34.0)
MCHC: 32.2 g/dL (ref 30.0–36.0)
MCV: 100.5 fL — ABNORMAL HIGH (ref 80.0–100.0)
Platelets: 243 10*3/uL (ref 150–400)
RBC: 4.42 MIL/uL (ref 3.87–5.11)
RDW: 12.8 % (ref 11.5–15.5)
WBC: 6.6 10*3/uL (ref 4.0–10.5)
nRBC: 0 % (ref 0.0–0.2)

## 2019-08-19 LAB — URINALYSIS, ROUTINE W REFLEX MICROSCOPIC
Glucose, UA: NEGATIVE mg/dL
Ketones, ur: 5 mg/dL — AB
Leukocytes,Ua: NEGATIVE
Nitrite: NEGATIVE
Protein, ur: 30 mg/dL — AB
Specific Gravity, Urine: 1.026 (ref 1.005–1.030)
pH: 5 (ref 5.0–8.0)

## 2019-08-19 LAB — TYPE AND SCREEN
ABO/RH(D): O POS
Antibody Screen: NEGATIVE

## 2019-08-19 LAB — BLOOD GAS, ARTERIAL
Acid-base deficit: 1.1 mmol/L (ref 0.0–2.0)
Bicarbonate: 22.6 mmol/L (ref 20.0–28.0)
Drawn by: 421801
FIO2: 21
O2 Saturation: 97.7 %
Patient temperature: 98.6
pCO2 arterial: 34.8 mmHg (ref 32.0–48.0)
pH, Arterial: 7.428 (ref 7.350–7.450)
pO2, Arterial: 94.4 mmHg (ref 83.0–108.0)

## 2019-08-19 LAB — PROTIME-INR
INR: 1 (ref 0.8–1.2)
Prothrombin Time: 12.8 seconds (ref 11.4–15.2)

## 2019-08-19 LAB — SURGICAL PCR SCREEN
MRSA, PCR: NEGATIVE
Staphylococcus aureus: NEGATIVE

## 2019-08-19 LAB — APTT: aPTT: 33 seconds (ref 24–36)

## 2019-08-19 LAB — ABO/RH: ABO/RH(D): O POS

## 2019-08-19 MED ORDER — ALBUTEROL SULFATE (2.5 MG/3ML) 0.083% IN NEBU
2.5000 mg | INHALATION_SOLUTION | Freq: Once | RESPIRATORY_TRACT | Status: AC
  Administered 2019-08-19: 2.5 mg via RESPIRATORY_TRACT

## 2019-08-19 NOTE — Progress Notes (Signed)
PCP:  [name] MD Cardiologist:  Denies  EKG:  08/19/19 CXR:  08/19/19 ECHO:  denies Stress Test:  denies Cardiac Cath:  Denies  Covid testing 08/18/19  Patient denies shortness of breath, fever, cough, and chest pain at PAT appointment.  Patient verbalized understanding of instructions provided today at the PAT appointment.  Patient asked to review instructions at home and day of surgery.

## 2019-08-19 NOTE — Progress Notes (Signed)
Abnormal U/A results sent to Dr. Roxan Hockey.

## 2019-08-21 ENCOUNTER — Encounter (HOSPITAL_COMMUNITY): Payer: Self-pay

## 2019-08-21 ENCOUNTER — Inpatient Hospital Stay (HOSPITAL_COMMUNITY): Payer: PPO

## 2019-08-21 ENCOUNTER — Other Ambulatory Visit: Payer: Self-pay

## 2019-08-21 ENCOUNTER — Encounter (HOSPITAL_COMMUNITY)
Admission: RE | Disposition: A | Discharge status: Home / Self Care | Payer: Self-pay | Source: Home / Self Care | Attending: Thoracic Surgery (Cardiothoracic Vascular Surgery)

## 2019-08-21 ENCOUNTER — Inpatient Hospital Stay (HOSPITAL_COMMUNITY)
Admission: RE | Admit: 2019-08-21 | Discharge: 2019-08-25 | DRG: 164 | Disposition: A | Discharge status: Home / Self Care | Payer: PPO | Attending: Thoracic Surgery (Cardiothoracic Vascular Surgery) | Admitting: Thoracic Surgery (Cardiothoracic Vascular Surgery)

## 2019-08-21 DIAGNOSIS — E785 Hyperlipidemia, unspecified: Secondary | ICD-10-CM | POA: Diagnosis present

## 2019-08-21 DIAGNOSIS — Z9889 Other specified postprocedural states: Secondary | ICD-10-CM

## 2019-08-21 DIAGNOSIS — R911 Solitary pulmonary nodule: Secondary | ICD-10-CM

## 2019-08-21 DIAGNOSIS — C3432 Malignant neoplasm of lower lobe, left bronchus or lung: Principal | ICD-10-CM | POA: Diagnosis present

## 2019-08-21 DIAGNOSIS — Z809 Family history of malignant neoplasm, unspecified: Secondary | ICD-10-CM | POA: Diagnosis not present

## 2019-08-21 DIAGNOSIS — I700 Atherosclerosis of aorta: Secondary | ICD-10-CM | POA: Diagnosis present

## 2019-08-21 DIAGNOSIS — Z4682 Encounter for fitting and adjustment of non-vascular catheter: Secondary | ICD-10-CM

## 2019-08-21 DIAGNOSIS — J432 Centrilobular emphysema: Secondary | ICD-10-CM | POA: Diagnosis present

## 2019-08-21 DIAGNOSIS — E7800 Pure hypercholesterolemia, unspecified: Secondary | ICD-10-CM | POA: Diagnosis present

## 2019-08-21 DIAGNOSIS — M109 Gout, unspecified: Secondary | ICD-10-CM | POA: Diagnosis present

## 2019-08-21 DIAGNOSIS — Z20828 Contact with and (suspected) exposure to other viral communicable diseases: Secondary | ICD-10-CM | POA: Diagnosis present

## 2019-08-21 DIAGNOSIS — F17210 Nicotine dependence, cigarettes, uncomplicated: Secondary | ICD-10-CM | POA: Diagnosis present

## 2019-08-21 DIAGNOSIS — M1990 Unspecified osteoarthritis, unspecified site: Secondary | ICD-10-CM | POA: Diagnosis present

## 2019-08-21 DIAGNOSIS — F419 Anxiety disorder, unspecified: Secondary | ICD-10-CM | POA: Diagnosis present

## 2019-08-21 DIAGNOSIS — F329 Major depressive disorder, single episode, unspecified: Secondary | ICD-10-CM | POA: Diagnosis present

## 2019-08-21 DIAGNOSIS — K219 Gastro-esophageal reflux disease without esophagitis: Secondary | ICD-10-CM | POA: Diagnosis present

## 2019-08-21 DIAGNOSIS — E039 Hypothyroidism, unspecified: Secondary | ICD-10-CM | POA: Diagnosis present

## 2019-08-21 DIAGNOSIS — Z888 Allergy status to other drugs, medicaments and biological substances status: Secondary | ICD-10-CM

## 2019-08-21 DIAGNOSIS — G2581 Restless legs syndrome: Secondary | ICD-10-CM | POA: Diagnosis present

## 2019-08-21 DIAGNOSIS — Z885 Allergy status to narcotic agent status: Secondary | ICD-10-CM

## 2019-08-21 DIAGNOSIS — Z09 Encounter for follow-up examination after completed treatment for conditions other than malignant neoplasm: Secondary | ICD-10-CM

## 2019-08-21 DIAGNOSIS — C3412 Malignant neoplasm of upper lobe, left bronchus or lung: Secondary | ICD-10-CM | POA: Diagnosis present

## 2019-08-21 DIAGNOSIS — Z902 Acquired absence of lung [part of]: Secondary | ICD-10-CM

## 2019-08-21 HISTORY — PX: CHEST TUBE INSERTION: SHX231

## 2019-08-21 HISTORY — PX: NODE DISSECTION: SHX5269

## 2019-08-21 HISTORY — PX: SEGMENTECOMY: SHX5076

## 2019-08-21 HISTORY — PX: VIDEO ASSISTED THORACOSCOPY (VATS)/WEDGE RESECTION: SHX6174

## 2019-08-21 LAB — BLOOD GAS, ARTERIAL
Acid-base deficit: 1.1 mmol/L (ref 0.0–2.0)
Bicarbonate: 24.4 mmol/L (ref 20.0–28.0)
Drawn by: 12971
O2 Content: 2 L/min
O2 Saturation: 89.8 %
Patient temperature: 98.6
pCO2 arterial: 50.6 mmHg — ABNORMAL HIGH (ref 32.0–48.0)
pH, Arterial: 7.304 — ABNORMAL LOW (ref 7.350–7.450)
pO2, Arterial: 59.7 mmHg — ABNORMAL LOW (ref 83.0–108.0)

## 2019-08-21 LAB — POCT I-STAT 7, (LYTES, BLD GAS, ICA,H+H)
Acid-base deficit: 3 mmol/L — ABNORMAL HIGH (ref 0.0–2.0)
Acid-base deficit: 3 mmol/L — ABNORMAL HIGH (ref 0.0–2.0)
Bicarbonate: 22.8 mmol/L (ref 20.0–28.0)
Bicarbonate: 23.8 mmol/L (ref 20.0–28.0)
Calcium, Ion: 1.24 mmol/L (ref 1.15–1.40)
Calcium, Ion: 1.26 mmol/L (ref 1.15–1.40)
HCT: 35 % — ABNORMAL LOW (ref 36.0–46.0)
HCT: 36 % (ref 36.0–46.0)
Hemoglobin: 11.9 g/dL — ABNORMAL LOW (ref 12.0–15.0)
Hemoglobin: 12.2 g/dL (ref 12.0–15.0)
O2 Saturation: 86 %
O2 Saturation: 93 %
Patient temperature: 35.2
Patient temperature: 35.2
Potassium: 3.6 mmol/L (ref 3.5–5.1)
Potassium: 4 mmol/L (ref 3.5–5.1)
Sodium: 141 mmol/L (ref 135–145)
Sodium: 141 mmol/L (ref 135–145)
TCO2: 24 mmol/L (ref 22–32)
TCO2: 25 mmol/L (ref 22–32)
pCO2 arterial: 40.3 mmHg (ref 32.0–48.0)
pCO2 arterial: 47.3 mmHg (ref 32.0–48.0)
pH, Arterial: 7.299 — ABNORMAL LOW (ref 7.350–7.450)
pH, Arterial: 7.352 (ref 7.350–7.450)
pO2, Arterial: 49 mmHg — ABNORMAL LOW (ref 83.0–108.0)
pO2, Arterial: 69 mmHg — ABNORMAL LOW (ref 83.0–108.0)

## 2019-08-21 SURGERY — VIDEO ASSISTED THORACOSCOPY (VATS)/WEDGE RESECTION
Anesthesia: General | Site: Chest | Laterality: Left

## 2019-08-21 MED ORDER — ONDANSETRON HCL 4 MG/2ML IJ SOLN
INTRAMUSCULAR | Status: DC | PRN
  Administered 2019-08-21: 4 mg via INTRAVENOUS

## 2019-08-21 MED ORDER — SODIUM CHLORIDE 0.9% FLUSH: 9.0000 mL | INTRAVENOUS | Status: DC | PRN

## 2019-08-21 MED ORDER — KETAMINE HCL 50 MG/5ML IJ SOSY
PREFILLED_SYRINGE | INTRAMUSCULAR | Status: AC
  Filled 2019-08-21: qty 5

## 2019-08-21 MED ORDER — ENOXAPARIN SODIUM 40 MG/0.4ML ~~LOC~~ SOLN
40.0000 mg | Freq: Every day | SUBCUTANEOUS | Status: DC
  Administered 2019-08-22 – 2019-08-25 (×4): 40 mg via SUBCUTANEOUS
  Filled 2019-08-21 (×4): qty 0.4

## 2019-08-21 MED ORDER — SODIUM CHLORIDE 0.9 % IV SOLN
INTRAVENOUS | Status: DC | PRN
  Administered 2019-08-21: 25 ug/min via INTRAVENOUS

## 2019-08-21 MED ORDER — BUPIVACAINE HCL (PF) 0.5 % IJ SOLN
INTRAMUSCULAR | Status: AC
  Filled 2019-08-21: qty 30

## 2019-08-21 MED ORDER — ACETAMINOPHEN 160 MG/5ML PO SOLN: 1000.0000 mg | Freq: Four times a day (QID) | ORAL | Status: DC

## 2019-08-21 MED ORDER — LUNG SURGERY BOOK
Freq: Once | Status: DC
  Filled 2019-08-21: qty 1

## 2019-08-21 MED ORDER — ACETAMINOPHEN 500 MG PO TABS
1000.0000 mg | ORAL_TABLET | Freq: Four times a day (QID) | ORAL | Status: DC
  Administered 2019-08-21 – 2019-08-25 (×14): 1000 mg via ORAL
  Filled 2019-08-21 (×15): qty 2

## 2019-08-21 MED ORDER — ROCURONIUM BROMIDE 10 MG/ML (PF) SYRINGE
PREFILLED_SYRINGE | INTRAVENOUS | Status: AC
  Filled 2019-08-21: qty 10

## 2019-08-21 MED ORDER — PHENYLEPHRINE 40 MCG/ML (10ML) SYRINGE FOR IV PUSH (FOR BLOOD PRESSURE SUPPORT)
PREFILLED_SYRINGE | INTRAVENOUS | Status: AC
  Filled 2019-08-21: qty 10

## 2019-08-21 MED ORDER — SUGAMMADEX SODIUM 200 MG/2ML IV SOLN
INTRAVENOUS | Status: DC | PRN
  Administered 2019-08-21: 330 mg via INTRAVENOUS

## 2019-08-21 MED ORDER — DIPHENHYDRAMINE HCL 50 MG/ML IJ SOLN: 12.5000 mg | Freq: Four times a day (QID) | INTRAMUSCULAR | Status: DC | PRN

## 2019-08-21 MED ORDER — PROPOFOL 10 MG/ML IV BOLUS
INTRAVENOUS | Status: AC
  Filled 2019-08-21: qty 40

## 2019-08-21 MED ORDER — SODIUM CHLORIDE (PF) 0.9 % IJ SOLN
INTRAMUSCULAR | Status: DC | PRN
  Administered 2019-08-21: 50 mL

## 2019-08-21 MED ORDER — ALBUTEROL SULFATE HFA 108 (90 BASE) MCG/ACT IN AERS
INHALATION_SPRAY | RESPIRATORY_TRACT | Status: DC | PRN
  Administered 2019-08-21: 6 via RESPIRATORY_TRACT

## 2019-08-21 MED ORDER — NALOXONE HCL 0.4 MG/ML IJ SOLN: 0.4000 mg | INTRAMUSCULAR | Status: DC | PRN

## 2019-08-21 MED ORDER — IPRATROPIUM-ALBUTEROL 0.5-2.5 (3) MG/3ML IN SOLN
3.0000 mL | Freq: Four times a day (QID) | RESPIRATORY_TRACT | Status: DC
  Administered 2019-08-21 – 2019-08-22 (×7): 3 mL via RESPIRATORY_TRACT
  Filled 2019-08-21 (×6): qty 3

## 2019-08-21 MED ORDER — LIDOCAINE 2% (20 MG/ML) 5 ML SYRINGE
INTRAMUSCULAR | Status: DC | PRN
  Administered 2019-08-21: 100 mg via INTRAVENOUS

## 2019-08-21 MED ORDER — PROPOFOL 10 MG/ML IV BOLUS
INTRAVENOUS | Status: DC | PRN
  Administered 2019-08-21: 100 mg via INTRAVENOUS
  Administered 2019-08-21: 10 mg via INTRAVENOUS
  Administered 2019-08-21: 100 mg via INTRAVENOUS

## 2019-08-21 MED ORDER — BUPIVACAINE LIPOSOME 1.3 % IJ SUSP
20.0000 mL | INTRAMUSCULAR | Status: DC
  Filled 2019-08-21: qty 20

## 2019-08-21 MED ORDER — CELECOXIB 200 MG PO CAPS
200.0000 mg | ORAL_CAPSULE | Freq: Every day | ORAL | Status: DC
  Administered 2019-08-22 – 2019-08-25 (×4): 200 mg via ORAL
  Filled 2019-08-21 (×4): qty 1

## 2019-08-21 MED ORDER — HYDROMORPHONE 1 MG/ML IV SOLN
INTRAVENOUS | Status: AC
  Filled 2019-08-21: qty 30

## 2019-08-21 MED ORDER — MIDAZOLAM HCL 2 MG/2ML IJ SOLN
INTRAMUSCULAR | Status: AC
  Filled 2019-08-21: qty 2

## 2019-08-21 MED ORDER — ONDANSETRON HCL 4 MG/2ML IJ SOLN: 4.0000 mg | Freq: Once | INTRAMUSCULAR | Status: DC | PRN

## 2019-08-21 MED ORDER — DEXAMETHASONE SODIUM PHOSPHATE 10 MG/ML IJ SOLN
INTRAMUSCULAR | Status: AC
  Filled 2019-08-21: qty 1

## 2019-08-21 MED ORDER — TRAMADOL HCL 50 MG PO TABS
50.0000 mg | ORAL_TABLET | Freq: Four times a day (QID) | ORAL | Status: DC | PRN
  Administered 2019-08-21: 100 mg via ORAL
  Filled 2019-08-21: qty 2

## 2019-08-21 MED ORDER — HYDROMORPHONE 1 MG/ML IV SOLN
INTRAVENOUS | Status: DC
  Administered 2019-08-22: 0.6 mg via INTRAVENOUS
  Administered 2019-08-22: 1.2 mg via INTRAVENOUS
  Administered 2019-08-22: 0.6 mg via INTRAVENOUS
  Administered 2019-08-22: 1.2 mg via INTRAVENOUS
  Administered 2019-08-22 (×2): 0.3 mg via INTRAVENOUS
  Administered 2019-08-22: 0.9 mg via INTRAVENOUS
  Administered 2019-08-23: 1.2 mg via INTRAVENOUS

## 2019-08-21 MED ORDER — ONDANSETRON HCL 4 MG/2ML IJ SOLN
INTRAMUSCULAR | Status: AC
  Filled 2019-08-21: qty 2

## 2019-08-21 MED ORDER — HYDROMORPHONE 1 MG/ML IV SOLN
INTRAVENOUS | Status: DC
  Administered 2019-08-21: 30 mg via INTRAVENOUS
  Administered 2019-08-21: 2.9 mg via INTRAVENOUS

## 2019-08-21 MED ORDER — SODIUM CHLORIDE 0.9 % IV SOLN: INTRAVENOUS | Status: DC | PRN

## 2019-08-21 MED ORDER — ESCITALOPRAM OXALATE 10 MG PO TABS
20.0000 mg | ORAL_TABLET | Freq: Every day | ORAL | Status: DC
  Administered 2019-08-22 – 2019-08-25 (×4): 20 mg via ORAL
  Filled 2019-08-21 (×4): qty 2

## 2019-08-21 MED ORDER — DIPHENHYDRAMINE HCL 12.5 MG/5ML PO ELIX
12.5000 mg | ORAL_SOLUTION | Freq: Four times a day (QID) | ORAL | Status: DC | PRN
  Filled 2019-08-21: qty 5

## 2019-08-21 MED ORDER — CHLORHEXIDINE GLUCONATE CLOTH 2 % EX PADS
6.0000 | MEDICATED_PAD | Freq: Every day | CUTANEOUS | Status: DC
  Administered 2019-08-21 – 2019-08-24 (×3): 6 via TOPICAL

## 2019-08-21 MED ORDER — MIDAZOLAM HCL 5 MG/5ML IJ SOLN
INTRAMUSCULAR | Status: DC | PRN
  Administered 2019-08-21: 2 mg via INTRAVENOUS

## 2019-08-21 MED ORDER — BUPIVACAINE LIPOSOME 1.3 % IJ SUSP
INTRAMUSCULAR | Status: DC | PRN
  Administered 2019-08-21: 50 mL

## 2019-08-21 MED ORDER — LABETALOL HCL 5 MG/ML IV SOLN
INTRAVENOUS | Status: AC
  Filled 2019-08-21: qty 4

## 2019-08-21 MED ORDER — ONDANSETRON HCL 4 MG/2ML IJ SOLN: 4.0000 mg | Freq: Four times a day (QID) | INTRAMUSCULAR | Status: DC | PRN

## 2019-08-21 MED ORDER — SUCCINYLCHOLINE CHLORIDE 200 MG/10ML IV SOSY
PREFILLED_SYRINGE | INTRAVENOUS | Status: AC
  Filled 2019-08-21: qty 10

## 2019-08-21 MED ORDER — FENTANYL CITRATE (PF) 100 MCG/2ML IJ SOLN
INTRAMUSCULAR | Status: DC | PRN
  Administered 2019-08-21: 100 ug via INTRAVENOUS
  Administered 2019-08-21: 150 ug via INTRAVENOUS

## 2019-08-21 MED ORDER — 0.9 % SODIUM CHLORIDE (POUR BTL) OPTIME
TOPICAL | Status: DC | PRN
  Administered 2019-08-21: 1000 mL

## 2019-08-21 MED ORDER — LACTATED RINGERS IV SOLN
INTRAVENOUS | Status: DC
  Administered 2019-08-21 (×2): via INTRAVENOUS

## 2019-08-21 MED ORDER — LACTATED RINGERS IV SOLN: INTRAVENOUS | Status: DC | PRN

## 2019-08-21 MED ORDER — SODIUM CHLORIDE 0.9 % IV SOLN
INTRAVENOUS | Status: DC
  Administered 2019-08-22: 12:00:00 via INTRAVENOUS

## 2019-08-21 MED ORDER — NALOXONE HCL 0.4 MG/ML IJ SOLN
0.4000 mg | INTRAMUSCULAR | Status: DC | PRN
  Administered 2019-08-21: 0.4 mg via INTRAVENOUS
  Filled 2019-08-21: qty 1

## 2019-08-21 MED ORDER — HYDROMORPHONE HCL 1 MG/ML IJ SOLN: 0.2500 mg | INTRAMUSCULAR | Status: DC | PRN

## 2019-08-21 MED ORDER — ATORVASTATIN CALCIUM 10 MG PO TABS
10.0000 mg | ORAL_TABLET | Freq: Every day | ORAL | Status: DC
  Administered 2019-08-21 – 2019-08-24 (×4): 10 mg via ORAL
  Filled 2019-08-21 (×4): qty 1

## 2019-08-21 MED ORDER — OXYCODONE HCL 5 MG PO TABS: 5.0000 mg | ORAL_TABLET | ORAL | Status: DC | PRN

## 2019-08-21 MED ORDER — SODIUM CHLORIDE 0.9% FLUSH
9.0000 mL | INTRAVENOUS | Status: DC | PRN
  Administered 2019-08-22: 9 mL via INTRAVENOUS
  Filled 2019-08-21: qty 9

## 2019-08-21 MED ORDER — CEFAZOLIN SODIUM-DEXTROSE 2-4 GM/100ML-% IV SOLN
2.0000 g | INTRAVENOUS | Status: AC
  Administered 2019-08-21: 2 g via INTRAVENOUS
  Filled 2019-08-21: qty 100

## 2019-08-21 MED ORDER — FENTANYL CITRATE (PF) 250 MCG/5ML IJ SOLN
INTRAMUSCULAR | Status: AC
  Filled 2019-08-21: qty 5

## 2019-08-21 MED ORDER — ROCURONIUM BROMIDE 50 MG/5ML IV SOSY
PREFILLED_SYRINGE | INTRAVENOUS | Status: DC | PRN
  Administered 2019-08-21 (×2): 10 mg via INTRAVENOUS
  Administered 2019-08-21: 80 mg via INTRAVENOUS
  Administered 2019-08-21: 10 mg via INTRAVENOUS

## 2019-08-21 MED ORDER — SENNOSIDES-DOCUSATE SODIUM 8.6-50 MG PO TABS
1.0000 | ORAL_TABLET | Freq: Every day | ORAL | Status: DC
  Administered 2019-08-21 – 2019-08-23 (×3): 1 via ORAL
  Filled 2019-08-21 (×3): qty 1

## 2019-08-21 MED ORDER — LABETALOL HCL 5 MG/ML IV SOLN
INTRAVENOUS | Status: DC | PRN
  Administered 2019-08-21: 5 mg via INTRAVENOUS

## 2019-08-21 MED ORDER — LACTATED RINGERS IV SOLN
INTRAVENOUS | Status: DC | PRN
  Administered 2019-08-21: 08:00:00 via INTRAVENOUS

## 2019-08-21 MED ORDER — LIDOCAINE 2% (20 MG/ML) 5 ML SYRINGE
INTRAMUSCULAR | Status: AC
  Filled 2019-08-21: qty 5

## 2019-08-21 MED ORDER — HEMOSTATIC AGENTS (NO CHARGE) OPTIME
TOPICAL | Status: DC | PRN
  Administered 2019-08-21: 1 via TOPICAL

## 2019-08-21 MED ORDER — IPRATROPIUM-ALBUTEROL 0.5-2.5 (3) MG/3ML IN SOLN
RESPIRATORY_TRACT | Status: AC
  Filled 2019-08-21: qty 3

## 2019-08-21 MED ORDER — MEPERIDINE HCL 25 MG/ML IJ SOLN: 6.2500 mg | INTRAMUSCULAR | Status: DC | PRN

## 2019-08-21 MED ORDER — PHENYLEPHRINE 40 MCG/ML (10ML) SYRINGE FOR IV PUSH (FOR BLOOD PRESSURE SUPPORT)
PREFILLED_SYRINGE | INTRAVENOUS | Status: DC | PRN
  Administered 2019-08-21: 40 ug via INTRAVENOUS

## 2019-08-21 MED ORDER — BISACODYL 5 MG PO TBEC
10.0000 mg | DELAYED_RELEASE_TABLET | Freq: Every day | ORAL | Status: DC
  Administered 2019-08-22 – 2019-08-24 (×3): 10 mg via ORAL
  Filled 2019-08-21 (×3): qty 2

## 2019-08-21 MED ORDER — CEFAZOLIN SODIUM-DEXTROSE 2-4 GM/100ML-% IV SOLN
2.0000 g | Freq: Three times a day (TID) | INTRAVENOUS | Status: AC
  Administered 2019-08-21 – 2019-08-22 (×2): 2 g via INTRAVENOUS
  Filled 2019-08-21 (×2): qty 100

## 2019-08-21 MED ORDER — DEXAMETHASONE SODIUM PHOSPHATE 10 MG/ML IJ SOLN
INTRAMUSCULAR | Status: DC | PRN
  Administered 2019-08-21: 10 mg via INTRAVENOUS

## 2019-08-21 SURGICAL SUPPLY — 95 items
BLADE CLIPPER SURG (BLADE) ×4 IMPLANT
CANISTER SUCT 3000ML PPV (MISCELLANEOUS) ×4 IMPLANT
CATH ROBINSON RED A/P 14FR (CATHETERS) ×4 IMPLANT
CATH THORACIC 28FR (CATHETERS) ×4 IMPLANT
CATH THORACIC 36FR (CATHETERS) IMPLANT
CATH THORACIC 36FR RT ANG (CATHETERS) IMPLANT
CLIP VESOCCLUDE MED 6/CT (CLIP) ×4 IMPLANT
CONN ST 1/4X3/8  BEN (MISCELLANEOUS)
CONN ST 1/4X3/8 BEN (MISCELLANEOUS) IMPLANT
CONN Y 3/8X3/8X3/8  BEN (MISCELLANEOUS)
CONN Y 3/8X3/8X3/8 BEN (MISCELLANEOUS) IMPLANT
CONT SPEC 4OZ CLIKSEAL STRL BL (MISCELLANEOUS) ×48 IMPLANT
COVER SURGICAL LIGHT HANDLE (MISCELLANEOUS) ×4 IMPLANT
COVER WAND RF STERILE (DRAPES) ×4 IMPLANT
CUTTER ECHEON FLEX ENDO 45 340 (ENDOMECHANICALS) ×4 IMPLANT
DEFOGGER SCOPE WARMER CLEARIFY (MISCELLANEOUS) IMPLANT
DERMABOND ADVANCED (GAUZE/BANDAGES/DRESSINGS) ×2
DERMABOND ADVANCED .7 DNX12 (GAUZE/BANDAGES/DRESSINGS) ×2 IMPLANT
DRAIN CHANNEL 28F RND 3/8 FF (WOUND CARE) IMPLANT
DRAIN CHANNEL 32F RND 10.7 FF (WOUND CARE) IMPLANT
DRAPE CV SPLIT W-CLR ANES SCRN (DRAPES) ×4 IMPLANT
DRAPE ORTHO SPLIT 77X108 STRL (DRAPES) ×2
DRAPE SURG ORHT 6 SPLT 77X108 (DRAPES) ×2 IMPLANT
DRAPE WARM FLUID 44X44 (DRAPES) ×4 IMPLANT
ELECT BLADE 6.5 EXT (BLADE) ×4 IMPLANT
ELECT REM PT RETURN 9FT ADLT (ELECTROSURGICAL) ×4
ELECTRODE REM PT RTRN 9FT ADLT (ELECTROSURGICAL) ×2 IMPLANT
GAUZE SPONGE 4X4 12PLY STRL (GAUZE/BANDAGES/DRESSINGS) IMPLANT
GAUZE SPONGE 4X4 12PLY STRL LF (GAUZE/BANDAGES/DRESSINGS) ×4 IMPLANT
GLOVE SURG SIGNA 7.5 PF LTX (GLOVE) ×8 IMPLANT
GLOVE TRIUMPH SURG SIZE 7.5 (KITS) ×8 IMPLANT
GOWN STRL REUS W/ TWL LRG LVL3 (GOWN DISPOSABLE) ×4 IMPLANT
GOWN STRL REUS W/ TWL XL LVL3 (GOWN DISPOSABLE) ×4 IMPLANT
GOWN STRL REUS W/TWL LRG LVL3 (GOWN DISPOSABLE) ×4
GOWN STRL REUS W/TWL XL LVL3 (GOWN DISPOSABLE) ×4
HEMOSTAT SURGICEL 2X14 (HEMOSTASIS) ×4 IMPLANT
KIT BASIN OR (CUSTOM PROCEDURE TRAY) ×4 IMPLANT
KIT SUCTION CATH 14FR (SUCTIONS) ×4 IMPLANT
KIT TURNOVER KIT B (KITS) ×4 IMPLANT
NEEDLE SPNL 18GX3.5 QUINCKE PK (NEEDLE) IMPLANT
NS IRRIG 1000ML POUR BTL (IV SOLUTION) ×12 IMPLANT
PACK CHEST (CUSTOM PROCEDURE TRAY) ×4 IMPLANT
PAD ARMBOARD 7.5X6 YLW CONV (MISCELLANEOUS) ×8 IMPLANT
POUCH ENDO CATCH II 15MM (MISCELLANEOUS) IMPLANT
POUCH SPECIMEN RETRIEVAL 10MM (ENDOMECHANICALS) ×4 IMPLANT
SCISSORS LAP 5X35 DISP (ENDOMECHANICALS) IMPLANT
SEALANT PROGEL (MISCELLANEOUS) IMPLANT
SEALANT SURG COSEAL 4ML (VASCULAR PRODUCTS) IMPLANT
SEALANT SURG COSEAL 8ML (VASCULAR PRODUCTS) IMPLANT
SHEARS HARMONIC HDI 20CM (ELECTROSURGICAL) ×4 IMPLANT
SOL ANTI FOG 6CC (MISCELLANEOUS) ×2 IMPLANT
SOLUTION ANTI FOG 6CC (MISCELLANEOUS) ×2
SPECIMEN JAR MEDIUM (MISCELLANEOUS) ×4 IMPLANT
SPONGE INTESTINAL PEANUT (DISPOSABLE) ×16 IMPLANT
SPONGE TONSIL TAPE 1 RFD (DISPOSABLE) ×4 IMPLANT
STAPLE RELOAD 2.5MM WHITE (STAPLE) ×12 IMPLANT
STAPLE RELOAD 45 GRN (STAPLE) ×22 IMPLANT
STAPLE RELOAD 45MM GOLD (STAPLE) ×32 IMPLANT
STAPLE RELOAD 45MM GREEN (STAPLE) ×22
STAPLER VASCULAR ECHELON 35 (CUTTER) ×4 IMPLANT
STOPCOCK 4 WAY LG BORE MALE ST (IV SETS) ×4 IMPLANT
SUT PROLENE 4 0 RB 1 (SUTURE)
SUT PROLENE 4-0 RB1 .5 CRCL 36 (SUTURE) IMPLANT
SUT SILK  1 MH (SUTURE) ×4
SUT SILK 1 MH (SUTURE) ×4 IMPLANT
SUT SILK 1 TIES 10X30 (SUTURE) ×4 IMPLANT
SUT SILK 2 0 SH (SUTURE) IMPLANT
SUT SILK 2 0SH CR/8 30 (SUTURE) IMPLANT
SUT SILK 3 0 SH 30 (SUTURE) IMPLANT
SUT SILK 3 0SH CR/8 30 (SUTURE) ×4 IMPLANT
SUT VIC AB 0 CTX 27 (SUTURE) IMPLANT
SUT VIC AB 1 CTX 27 (SUTURE) ×4 IMPLANT
SUT VIC AB 2-0 CT1 27 (SUTURE)
SUT VIC AB 2-0 CT1 TAPERPNT 27 (SUTURE) IMPLANT
SUT VIC AB 2-0 CTX 36 (SUTURE) ×8 IMPLANT
SUT VIC AB 3-0 MH 27 (SUTURE) IMPLANT
SUT VIC AB 3-0 SH 27 (SUTURE)
SUT VIC AB 3-0 SH 27X BRD (SUTURE) IMPLANT
SUT VIC AB 3-0 X1 27 (SUTURE) ×4 IMPLANT
SUT VICRYL 0 UR6 27IN ABS (SUTURE) ×8 IMPLANT
SUT VICRYL 2 TP 1 (SUTURE) IMPLANT
SYR 10ML LL (SYRINGE) ×4 IMPLANT
SYR 20ML LL LF (SYRINGE) ×4 IMPLANT
SYR 30ML LL (SYRINGE) ×4 IMPLANT
SYR 50ML LL SCALE MARK (SYRINGE) ×4 IMPLANT
SYSTEM SAHARA CHEST DRAIN ATS (WOUND CARE) ×4 IMPLANT
TAPE CLOTH SURG 4X10 WHT LF (GAUZE/BANDAGES/DRESSINGS) ×4 IMPLANT
TIP APPLICATOR SPRAY EXTEND 16 (VASCULAR PRODUCTS) IMPLANT
TOWEL GREEN STERILE (TOWEL DISPOSABLE) ×4 IMPLANT
TOWEL GREEN STERILE FF (TOWEL DISPOSABLE) ×4 IMPLANT
TRAY FOLEY MTR SLVR 16FR STAT (SET/KITS/TRAYS/PACK) ×4 IMPLANT
TROCAR XCEL BLADELESS 5X75MML (TROCAR) ×4 IMPLANT
TROCAR XCEL NON-BLD 5MMX100MML (ENDOMECHANICALS) IMPLANT
TUBING EXTENTION W/L.L. (IV SETS) ×4 IMPLANT
WATER STERILE IRR 1000ML POUR (IV SOLUTION) ×8 IMPLANT

## 2019-08-21 NOTE — Interval H&P Note (Signed)
History and Physical Interval Note:   Will proceed with left VATS, robot unavailable. Patient had been informed previously 08/21/2019 7:49 AM  Janace Hoard  has presented today for surgery, with the diagnosis of LEFT UPPER AND LOWER LOBE LUNG NODULES.  The various methods of treatment have been discussed with the patient and family. After consideration of risks, benefits and other options for treatment, the patient has consented to  Procedure(s): VIDEO ASSISTED THORACOSCOPY (VATS)/WEDGE RESECTION (Left) POSSIBLE LINGULAR SEGMENTECTOMY (Left) as a surgical intervention.  The patient's history has been reviewed, patient examined, no change in status, stable for surgery.  I have reviewed the patient's chart and labs.  Questions were answered to the patient's satisfaction.     Melrose Nakayama

## 2019-08-21 NOTE — Plan of Care (Signed)
  Problem: Cardiac: Goal: Will achieve and/or maintain hemodynamic stability Outcome: Progressing   Problem: Clinical Measurements: Goal: Postoperative complications will be avoided or minimized Outcome: Progressing   Problem: Respiratory: Goal: Respiratory status will improve Outcome: Progressing   Problem: Pain Management: Goal: Pain level will decrease Outcome: Progressing

## 2019-08-21 NOTE — Anesthesia Procedure Notes (Signed)
Arterial Line Insertion Start/End10/15/2020 7:35 AM Performed by: Leonor Liv, CRNA, CRNA  Lidocaine 1% used for infiltration Right, radial was placed Catheter size: 20 G Hand hygiene performed  and maximum sterile barriers used  Allen's test indicative of satisfactory collateral circulation Attempts: 2 Procedure performed without using ultrasound guided technique. Following insertion, dressing applied and Biopatch. Post procedure assessment: normal  Patient tolerated the procedure well with no immediate complications.

## 2019-08-21 NOTE — Anesthesia Postprocedure Evaluation (Signed)
Anesthesia Post Note  Patient: Andrea Kline  Procedure(s) Performed: VIDEO ASSISTED THORACOSCOPY (VATS) LEFT LOWER LUNG WEDGE RESECTION (Left Chest) LEFT  LINGULAR SEGMENTECTOMY (Left Chest) Node Dissection (Chest) Chest Tube Insertion (Left Chest)     Patient location during evaluation: PACU Anesthesia Type: General Level of consciousness: awake and alert Pain management: pain level controlled Vital Signs Assessment: post-procedure vital signs reviewed and stable Respiratory status: spontaneous breathing, nonlabored ventilation, respiratory function stable and patient connected to nasal cannula oxygen Cardiovascular status: blood pressure returned to baseline and stable Postop Assessment: no apparent nausea or vomiting Anesthetic complications: no    Last Vitals:  Vitals:   08/21/19 1315 08/21/19 1330  BP: 115/71 117/63  Pulse: 71 71  Resp: 18 16  Temp:  36.4 C  SpO2: 97% 97%    Last Pain:  Vitals:   08/21/19 1315  PainSc: [facility]

## 2019-08-21 NOTE — Progress Notes (Signed)
      [address]       [place] [address]             [phone]      Andrea Kline is lethargic. Per Rn she has been that way since arrival around 1 PM. Sats on 3L [place] were dropping into 70s. Would improve to 90s when she was awakened, but she would drift off again rapidly. She was placed on a NRB mask. Sats currently 100% She is arousable and has no neuro deficits. Will check ABG, wean O as tolerated, give Narcan  Remo Lipps C. Roxan Hockey, MD [facility] [phone]

## 2019-08-21 NOTE — Progress Notes (Signed)
Pt very hard to arouse. O2 SATs dropping in 80's. Pt placed on venti mask SATs remained in 80's. Patient placed on non-rebreather pt SAT's currently 97-99. Paged MD to advise.

## 2019-08-21 NOTE — Progress Notes (Signed)
Patient ID: Andrea Kline, female   DOB: 12/15/1955, 63 y.o.   MRN: [id number] EVENING ROUNDS NOTE :     [address]       [place] [address]             [phone]                 Day of Surgery Procedure(s) (LRB): VIDEO ASSISTED THORACOSCOPY (VATS) LEFT LOWER LUNG WEDGE RESECTION (Left) LEFT  LINGULAR SEGMENTECTOMY (Left) Node Dissection Chest Tube Insertion (Left)  Total Length of Stay:  LOS: 0 days  BP (!) 146/91 (BP Location: Left Arm)   Pulse 90   Temp (!) 97.4 F (36.3 C) (Oral)   Resp 11   Ht 5\' 5"  (1.651 m)   Wt 81.3 kg   SpO2 94%   BMI 29.84 kg/m   .Intake/Output      10/14 0701 - 10/15 0700 10/15 0701 - 10/16 0700   I.V. (mL/kg)  1700 (20.9)   Total Intake(mL/kg)  1700 (20.9)   Urine (mL/kg/hr)  155 (0.2)   Blood  100   Chest Tube  60   Total Output  315   Net  +1385          . sodium chloride    . sodium chloride    .  ceFAZolin (ANCEF) IV 2 g (08/21/19 1647)     Lab Results  Component Value Date   WBC 6.6 08/19/2019   HGB 12.2 08/21/2019   HCT 36.0 08/21/2019   PLT 243 08/19/2019   GLUCOSE 107 (H) 08/19/2019   CHOL 172 04/06/2014   TRIG 132 04/06/2014   HDL 38 (L) 04/06/2014   LDLCALC 108 (H) 04/06/2014   ALT 13 08/19/2019   AST 16 08/19/2019   NA 141 08/21/2019   K 3.6 08/21/2019   CL 107 08/19/2019   CREATININE 0.72 08/19/2019   BUN 9 08/19/2019   CO2 19 (L) 08/19/2019   TSH 1.190 04/06/2014   INR 1.0 08/19/2019   Patient presented from RR over sedated , required narcan, now awake and talking  Will reduce pca dosage   No air leak   Grace Isaac MD  Beeper [phone] Office [phone] 08/21/2019 6:34 PM

## 2019-08-21 NOTE — Plan of Care (Signed)
  Problem: Education: Goal: Knowledge of General Education information will improve Description: Including pain rating scale, medication(s)/side effects and non-pharmacologic comfort measures Outcome: Progressing   Problem: Clinical Measurements: Goal: Ability to maintain clinical measurements within normal limits will improve Outcome: Progressing Goal: Respiratory complications will improve Outcome: Progressing   Problem: Activity: Goal: Risk for activity intolerance will decrease Outcome: Progressing   Problem: Coping: Goal: Level of anxiety will decrease Outcome: Progressing

## 2019-08-21 NOTE — Anesthesia Procedure Notes (Signed)
Procedure Name: Intubation Date/Time: 08/21/2019 8:40 AM Performed by: Leonor Liv, CRNA Pre-anesthesia Checklist: Patient identified, Emergency Drugs available, Suction available and Patient being monitored Patient Re-evaluated:Patient Re-evaluated prior to induction Oxygen Delivery Method: Circle System Utilized Preoxygenation: Pre-oxygenation with 100% oxygen Induction Type: IV induction Ventilation: Mask ventilation without difficulty and Oral airway inserted - appropriate to patient size Laryngoscope Size: Mac and 3 Grade View: Grade II Tube type: Oral Endobronchial tube: Double lumen EBT, Left and EBT position confirmed by fiberoptic bronchoscope and 37 Fr Number of attempts: 1 Airway Equipment and Method: Oral airway,  Rigid stylet and Fiberoptic brochoscope Placement Confirmation: ETT inserted through vocal cords under direct vision,  positive ETCO2 and breath sounds checked- equal and bilateral Secured at: 28 cm Tube secured with: Tape Dental Injury: Teeth and Oropharynx as per pre-operative assessment  Comments: Grade II view. Pt anterior. 1 attempt per SRNA and 1 successful attempt per anesthesiologist.

## 2019-08-21 NOTE — Brief Op Note (Signed)
08/21/2019  11:50 AM  PATIENT:  Janace Hoard  63 y.o. female  PRE-OPERATIVE DIAGNOSIS:  LEFT UPPER AND LOWER LOBE LUNG NODULES  POST-OPERATIVE DIAGNOSIS:  LEFT UPPER AND LOWER LOBE LUNG NODULES  PROCEDURE:  Procedure(s): VIDEO ASSISTED THORACOSCOPY (VATS) LEFT LOWER LUNG WEDGE RESECTION (Left) LEFT  LINGULAR SEGMENTECTOMY  MEDIASTINAL LYMPH NODE DISSECTION Chest Tube Insertion (Left)  SURGEON:   Melrose Nakayama, MD   PHYSICIAN ASSISTANT:  Daquawn Seelman  ANESTHESIA:   general  EBL:  100 mL   BLOOD ADMINISTERED:none  DRAINS:  73fr left pleural tube x 1.   LOCAL MEDICATIONS USED:  MARCAINE     SPECIMEN:  Source of Specimen:  Left lower lobe wedge,  lingular segment of upper lobe  DISPOSITION OF SPECIMEN:  PATHOLOGY  COUNTS:  YES  DICTATION: .Dragon Dictation  PLAN OF CARE: Admit to inpatient   PATIENT DISPOSITION:  PACU - hemodynamically stable.   Delay start of Pharmacological VTE agent (>24hrs) due to surgical blood loss or risk of bleeding: no

## 2019-08-21 NOTE — Anesthesia Procedure Notes (Signed)
Central Venous Catheter Insertion Performed by: Lillia Abed, MD, anesthesiologist Start/End10/15/2020 8:00 AM, 08/21/2019 8:10 AM Patient location: OR. Preanesthetic checklist: patient identified, IV checked, risks and benefits discussed, surgical consent, monitors and equipment checked, pre-op evaluation, timeout performed and anesthesia consent Position: Trendelenburg Lidocaine 1% used for infiltration and patient sedated Hand hygiene performed  and maximum sterile barriers used  Catheter size: 8 Fr Total catheter length 16. Central line was placed.Double lumen Procedure performed using ultrasound guided technique. Ultrasound Notes:anatomy identified, needle tip was noted to be adjacent to the nerve/plexus identified, no ultrasound evidence of intravascular and/or intraneural injection and image(s) printed for medical record Attempts: 1 Following insertion, dressing applied, line sutured and Biopatch. Post procedure assessment: blood return through all ports, free fluid flow and no air  Patient tolerated the procedure well with no immediate complications.

## 2019-08-21 NOTE — Anesthesia Preprocedure Evaluation (Signed)
Anesthesia Evaluation  Patient identified by MRN, date of birth, ID band Patient awake    Reviewed: Allergy & Precautions, NPO status , Patient's Chart, lab work & pertinent test results  Airway Mallampati: I  TM Distance: >3 FB Neck ROM: Full    Dental   Pulmonary shortness of breath, asthma , COPD, Current Smoker and Patient abstained from smoking.,    Pulmonary exam normal        Cardiovascular Normal cardiovascular exam     Neuro/Psych Anxiety Depression    GI/Hepatic GERD  Controlled and Medicated,(+) Hepatitis -  Endo/Other    Renal/GU      Musculoskeletal   Abdominal   Peds  Hematology   Anesthesia Other Findings   Reproductive/Obstetrics                             Anesthesia Physical Anesthesia Plan  ASA: III  Anesthesia Plan: General   Post-op Pain Management:    Induction: Intravenous  PONV Risk Score and Plan: 2 and Ondansetron and Midazolam  Airway Management Planned: Double Lumen EBT  Additional Equipment: Arterial line, CVP and Ultrasound Guidance Line Placement  Intra-op Plan:   Post-operative Plan: Extubation in OR  Informed Consent: I have reviewed the patients History and Physical, chart, labs and discussed the procedure including the risks, benefits and alternatives for the proposed anesthesia with the patient or authorized representative who has indicated his/her understanding and acceptance.       Plan Discussed with: CRNA and Surgeon  Anesthesia Plan Comments:         Anesthesia Quick Evaluation

## 2019-08-21 NOTE — Transfer of Care (Signed)
Immediate Anesthesia Transfer of Care Note  Patient: KIMIA FINAN  Procedure(s) Performed: VIDEO ASSISTED THORACOSCOPY (VATS) LEFT LOWER LUNG WEDGE RESECTION (Left Chest) LEFT  LINGULAR SEGMENTECTOMY (Left Chest) Node Dissection (Chest) Chest Tube Insertion (Left Chest)  Patient Location: PACU  Anesthesia Type:General  Level of Consciousness: awake and drowsy  Airway & Oxygen Therapy: Patient Spontanous Breathing and Patient connected to face mask oxygen  Post-op Assessment: Report given to RN, Post -op Vital signs reviewed and stable and Patient moving all extremities  Post vital signs: Reviewed and stable  Last Vitals:  Vitals Value Taken Time  BP 146/73 08/21/19 1216  Temp    Pulse 72 08/21/19 1218  Resp 12 08/21/19 1218  SpO2 100 % 08/21/19 1218  Vitals shown include unvalidated device data.  Last Pain:  Vitals:   08/21/19 0715  PainSc: 0-No pain      Patients Stated Pain Goal: 5 (21/94/71 2527)  Complications: No apparent anesthesia complications

## 2019-08-22 ENCOUNTER — Inpatient Hospital Stay (HOSPITAL_COMMUNITY): Payer: PPO

## 2019-08-22 ENCOUNTER — Encounter (HOSPITAL_COMMUNITY): Payer: Self-pay | Admitting: Thoracic Surgery (Cardiothoracic Vascular Surgery)

## 2019-08-22 LAB — BLOOD GAS, ARTERIAL
Acid-base deficit: 0.1 mmol/L (ref 0.0–2.0)
Bicarbonate: 25.9 mmol/L (ref 20.0–28.0)
O2 Content: 4 L/min
O2 Saturation: 95.5 %
Patient temperature: 97.9
pCO2 arterial: 56.1 mmHg — ABNORMAL HIGH (ref 32.0–48.0)
pH, Arterial: 7.283 — ABNORMAL LOW (ref 7.350–7.450)
pO2, Arterial: 80 mmHg — ABNORMAL LOW (ref 83.0–108.0)

## 2019-08-22 LAB — CBC
HCT: 37.9 % (ref 36.0–46.0)
Hemoglobin: 12.3 g/dL (ref 12.0–15.0)
MCH: 33.2 pg (ref 26.0–34.0)
MCHC: 32.5 g/dL (ref 30.0–36.0)
MCV: 102.4 fL — ABNORMAL HIGH (ref 80.0–100.0)
Platelets: 194 10*3/uL (ref 150–400)
RBC: 3.7 MIL/uL — ABNORMAL LOW (ref 3.87–5.11)
RDW: 12.9 % (ref 11.5–15.5)
WBC: 13.1 10*3/uL — ABNORMAL HIGH (ref 4.0–10.5)
nRBC: 0 % (ref 0.0–0.2)

## 2019-08-22 LAB — BASIC METABOLIC PANEL
Anion gap: 7 (ref 5–15)
BUN: 9 mg/dL (ref 8–23)
CO2: 24 mmol/L (ref 22–32)
Calcium: 8.2 mg/dL — ABNORMAL LOW (ref 8.9–10.3)
Chloride: 106 mmol/L (ref 98–111)
Creatinine, Ser: 0.7 mg/dL (ref 0.44–1.00)
GFR calc Af Amer: >60 mL/min (ref >60)
GFR calc non Af Amer: >60 mL/min (ref >60)
Glucose, Bld: 111 mg/dL — ABNORMAL HIGH (ref 70–99)
Potassium: 4.3 mmol/L (ref 3.5–5.1)
Sodium: 137 mmol/L (ref 135–145)

## 2019-08-22 MED ORDER — IPRATROPIUM-ALBUTEROL 0.5-2.5 (3) MG/3ML IN SOLN
3.0000 mL | Freq: Three times a day (TID) | RESPIRATORY_TRACT | Status: DC
  Administered 2019-08-23: 3 mL via RESPIRATORY_TRACT
  Filled 2019-08-22: qty 3

## 2019-08-22 NOTE — Op Note (Signed)
NAME: Andrea Kline, Andrea Kline MEDICAL RECORD [id number] ACCOUNT [id number] DATE OF BIRTH:07/12/1956 FACILITY: [facility] LOCATION: [facility] PHYSICIAN:Spenser Harren C. Roma Bierlein, MD  OPERATIVE REPORT  DATE OF PROCEDURE:  08/21/2019  PREOPERATIVE DIAGNOSIS:  Left upper and lower lobe lung nodules, suspected non-small cell lung cancer, possible stage T3, N0.  POSTOPERATIVE DIAGNOSIS:   1. Adenocarcinoma of left lower lobe, clinical stage T1, N0, 2. Small-cell carcinoma, left upper lobe, clinical stage T1, N0.  PROCEDURE:   Left video-assisted thoracoscopy, Wedge resection left lower lobe lung nodule Lingular segmentectomy Lymph node dissection Intercostal nerve blocks levels 3 through 10.  SURGEON:  Modesto Charon, MD  ASSISTANT:  Enid Cutter, PA  ANESTHESIA:  General.  FINDINGS:  Nodule in lateral aspect of the left lower lobe.  Frozen section revealed low grade adenocarcinoma with clear parenchymal margins.  Nodule in the lingula frozen section revealed probable small-cell carcinoma.  Margins negative.  CLINICAL NOTE:  The patient is a 63 year old woman with a history of tobacco abuse who has been followed for a left lower lobe lung nodule.  This was first noted in 2018.  There was only mild FDG uptake.  Follow up in 03/2018 showed the nodule had  increased to 12 mm.  She had a PET CT in October, which showed the nodule had increased to 13 mm and had a low-grade FDG uptake with an SUV of 1.6.  There was a new lingular nodule with an SUV of 4.7.  She was advised to undergo surgical resection with a  plan for a wedge resection for the low-grade lower lobe lesion and a probable segmentectomy for the upper lobe lesion.  The indications, risks, benefits, and alternatives were discussed in detail with the patient.  She understood and accepted the risks  and agreed to proceed.  OPERATIVE NOTE:  Andrea Kline was brought to the preoperative holding area on 08/21/2019.  There, the anesthesia service  placed a central venous catheter and an arterial blood pressure monitor.  She was taken to the operating room, anesthetized and  intubated with a double lumen endotracheal tube.  Intravenous antibiotics were administered.  Sequential compression devices were placed on the calves for DVT prophylaxis.  A Foley catheter was placed.  She was placed in a right lateral decubitus  position and the left chest was prepped and draped in the usual sterile fashion.  Single-lung ventilation of the right lung was initiated and was tolerated well throughout the procedure.  A timeout was performed.  A solution containing 20 mL of liposomal bupivacaine, 30 mL of 0.5% bupivacaine and 50 mL of normal saline was prepared.  This was used for the local at the incisions as well as for the intercostal nerve blocks.  The areas of the  incisions were injected prior to making incision.  An incision was made in the eighth interspace in the mid axillary line, a 5 mm port was inserted into the chest.  The thoracoscope was advanced into the chest.  There was good isolation of the left lung.  An incision was made in the 4th interspace  anterolaterally.  No rib spreading was performed during the procedure.  The inferior ligament was divided with the Harmonic scalpel.  All lymph nodes that were encountered during the dissection were removed and sent as separate specimens for permanent  pathology.  All the nodes appeared relatively normal, although some were slightly enlarged.  The pleural reflection was divided at the hilum posteriorly.  No level 7 node was identified at this point in  the procedure or later.  A nodule was palpable in  the lateral aspect of the lower lobe.  This was removed with a wedge resection with sequential firings of an Echelon 45 mm stapler using both gold and green cartridges.  A 2 cm gross margin was maintained on the nodule.  The specimen was removed from  the chest and sent for frozen section on the nodule and  the margin which returned showing a low-grade adenocarcinoma and the margin was clear.  The upper lobe nodule was palpable on the anterior aspect of the lingula.  Decision was made to proceed with a lingular segmentectomy as a wedge resection would leave the remainder of the lingula minimal and probably nonfunctional.  The pulmonary artery was  identified in the fissure, which was relatively complete.  Overlying pleura was dissected off.  A small portion of the fissure anteriorly was completed with an Echelon stapler.  The pleural reflection was divided at the hilum anteriorly and superiorly.   Additional lymph nodes were removed and sent for pathology.  The lingular vein branches were identified.  These were encircled and divided with an endoscopic vascular stapler.  Next, the lingular arterial branches were identified.  These arose from a  common trunk before bifurcating.  Nodes around this were removed and the artery then was easily encircled. To get a better angle on the lingular artery another port incision was made anterior to the original port incision and the lingular artery was  divided with the endoscopic vascular stapler.  Dissection then identified the left upper lobe bronchus and the lingular segmental bronchus was identified.  It was dissected out and encircled.  In order to get a good placement of the stapler across the  lingular bronchus, an incision was made posteriorly below the tip of the scapula.  A stapler was placed across the lingular bronchus and closed.  A test inflation showed good aeration of the lower lobe and the nonlingular portion of the upper lobe.  The  stapler was fired, transecting the lingular bronchus.  The parenchymal division then was performed based on the portion of the lung that was under ventilated.  A good margin was maintained on the nodule.  This was completed with sequential firings of the  Echelon stapler again using both gold and green cartridges.  The  specimen was removed in an endoscopic retrieval bag and sent for frozen section of the nodule and the bronchial margin which later returned showing a probable small-cell carcinoma. The  bronchial margin was clear as were the stapled margins.  Additional inspection was made for level 7 nodes and none were identified.  Intercostal nerve blocks were performed from the 3rd to the 10th interspace. 10 mL of the liposomal bupivacaine solution  was injected into a subpleural location at each level.  The chest was copiously irrigated with warm saline.  A test inflation showed no significant air leak.  There was an area in the anterior portion of the upper lobe that was under ventilated and likely  was supplied by the lingular bronchus.  This area was stapled off using the Echelon stapler and sent to pathology.  A 28-French chest tube was placed through the anterior most port incision and secured with a #1 silk suture.  Dual-lung ventilation was  resumed.  The working incision was closed in standard fashion as were the remaining 2 port incisions.  Dermabond was applied to the incisions.  The chest tube was placed to suction.  The patient  was placed back in supine position.  She was extubated  in the operating room and taken to the [facility] in good condition.  TN/NUANCE  D:08/22/2019 T:08/22/2019 JOB:[id number]

## 2019-08-22 NOTE — Discharge Instructions (Signed)

## 2019-08-22 NOTE — Progress Notes (Addendum)
1 Day Post-Op Procedure(s) (LRB): VIDEO ASSISTED THORACOSCOPY (VATS) LEFT LOWER LUNG WEDGE RESECTION (Left) LEFT  LINGULAR SEGMENTECTOMY (Left) Node Dissection Chest Tube Insertion (Left) Subjective: Awake and MS appropriate this AM. Pain is controlled with the PCA.   Objective: Vital signs in last 24 hours: Temp:  [97.4 F (36.3 C)-97.9 F (36.6 C)] 97.9 F (36.6 C) (10/16 0250) Pulse Rate:  [65-94] 83 (10/16 0400) Cardiac Rhythm: Normal sinus rhythm (10/16 0500) Resp:  [8-27] 24 (10/16 0400) BP: (108-157)/(59-96) 138/69 (10/16 0400) SpO2:  [83 %-100 %] 96 % (10/16 0400) Arterial Line BP: (96-157)/(62-85) 129/79 (10/16 0400)    Intake/Output from previous day: 10/15 0701 - 10/16 0700 In: 1900 [I.V.:1700; IV Piggyback:200] Out: 1145 [Urine:705; Blood:100; Chest Tube:340] Intake/Output this shift: No intake/output data recorded.  General appearance: alert, cooperative and no distress Neurologic: intact Heart: SR, no significant arrhythmias. Lungs: Breath sounds clear, shallow. No air leak, moderate serous drainage from chest tube. CXR shows expect volume loss on left, no PTX. Right lung clear.  Wound: chest dressing dry.    Lab Results: Recent Labs    08/19/19 1522  08/21/19 1016 08/22/19 0431  WBC 6.6  --   --  13.1*  HGB 14.3   < > 12.2 12.3  HCT 44.4   < > 36.0 37.9  PLT 243  --   --  194   < > = values in this interval not displayed.   BMET:  Recent Labs    08/19/19 1522  08/21/19 1016 08/22/19 0431  NA 138   < > 141 137  K 4.0   < > 3.6 4.3  CL 107  --   --  106  CO2 19*  --   --  24  GLUCOSE 107*  --   --  111*  BUN 9  --   --  9  CREATININE 0.72  --   --  0.70  CALCIUM 9.2  --   --  8.2*   < > = values in this interval not displayed.    PT/INR:  Recent Labs    08/19/19 1522  LABPROT 12.8  INR 1.0   ABG    Component Value Date/Time   PHART 7.283 (L) 08/22/2019 0420   HCO3 25.9 08/22/2019 0420   TCO2 25 08/21/2019 1016   ACIDBASEDEF  0.1 08/22/2019 0420   O2SAT 95.5 08/22/2019 0420   CBG (last 3)  No results for input(s): GLUCAP in the last 72 hours.  Assessment/Plan: S/P Procedure(s) (LRB): VIDEO ASSISTED THORACOSCOPY (VATS) LEFT LOWER LUNG WEDGE RESECTION (Left) LEFT  LINGULAR SEGMENTECTOMY (Left) Node Dissection Chest Tube Insertion (Left)  -POD-1 left VATS with LLL wedge resection and lingular segmentectomy for 2 separate primary lung cancers. Final path pending. Hypercarbic with early postop but this has improved.  O2 sats 96% on 4LncO2. Place CT to water seal, d/c arterial line. Encourage pulmonary hygiene.   -History of anxiety / depression- Lexapro resumed.   -DVT PPX-continue enoxaparin, mobilize.     LOS: 1 day    Antony Odea, PA-C [phone] 08/22/2019 Patient seen and examined, agree with above Dc A line No air leak- CT to water seal  Remo Lipps C. Roxan Hockey, MD [facility] [phone]

## 2019-08-22 NOTE — Discharge Summary (Addendum)
Physician Discharge Summary  Patient ID: Andrea Kline MRN: [id number] DOB/AGE: 02-24-1956 63 y.o.  Admit date: 08/21/2019 Discharge date: 08/25/2019  Admission Diagnoses: Left upper and lower lobe lung nodules  Patient Active Problem List   Diagnosis Date Noted  . Tobacco abuse 10/22/2018  . Diverticulosis 10/22/2018  . Pulmonary nodule 04/10/2018  . Centrilobular emphysema ([place]) 04/10/2018  . Coronary artery calcification seen on CAT scan 04/10/2018  . Acute GI bleeding 02/28/2018  . Labyrinthitis of both ears 04/06/2014  . Migraine, unspecified, without mention of intractable migraine without mention of status migrainosus 04/06/2014  . Vertigo 04/05/2014  . Anxiety 04/05/2014  . Depression 04/05/2014  . HLD (hyperlipidemia) 04/05/2014  . Hematuria 04/05/2014  . History of nephrolithiasis 04/05/2014  . 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  Andrea Kline is a 63 year old woman with a history of tobacco abuse, positive PPD, vertigo, reflux, hyperlipidemia, hypothyroidism, lower GI bleed, arthritis, bursitis, and diverticulitis.  She has smoked about a pack of cigarettes daily dating back to age 63 (53 pack years).  She started in the lung cancer screening program in 2018.  She was found to have a 10 mm left lower lobe lung nodule.  On PET there was no significant FDG uptake.  A follow-up CT in May 2019 showed the nodule had increased to 12 mm.  She had a follow-up with a PET CT in October 2019.  The nodule was 13 mm and had an SUV max of 1.6.  Her follow-up CT was delayed due to COVID this  year, but she finally had the CT done in August.  The left lower lobe nodule had increased in size to 17 mm.  There was a new lingular nodule that measured 1 x 1.1 cm.  PET/CT on 07/18/2019 showed 1.5 cm lingular nodule with an SUV of 4.7.  The lower lobe nodule had mild FDG uptake with an SUV max of 1.48.  She continues to smoke about a pack or a little over a pack of cigarettes daily.  She gets short of breath with exertion.  Ambulation is limited by vertigo and she walks with a cane.  She says that she can walk about a half a mile on level ground without getting short of breath.  She is not having any chest pain, pressure, or tightness.  She has not had any change in appetite.  She has gained some weight with COVID isolation.   Hospital Course:   Andrea Kline was admitted for same-day surgery on 08/21/19 and taken to the OR where left VATS was performed for wedge resection of a left lower lobe nodule and also lingular segmentectomy for a separate lesion. 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 She tolerated the procedure well and was extubated and transferred to the PACU where she developed hypercarbic respiratory distress.  She was placed on a 100% NRB device and given narcan to reverse narcotic analgesics she had recently received. Her respiratory status stabilized and ABG improved. She was  transferred to [facility]. On post-op day 1, she had no air leak and minimal chest tube drainage. The chest tube was placed to water seal. Pain was controlled with reduced-dose PCA. She was started back on her chronic SSRI for history of anxiety.  She was mobilized and the arterial line was removed.  (Above was dictated by Enid Cutter, PA-C) POD 2 her chest tube was removed. We discontinued her foley and PCA. She had some over sedation  the night prior and required narcan. Once awake she her oxygenation significantly improved and she was back to her baseline. Discussed with the patient that she is very sensitive to narcotics and that she should limit her use. POD 3 she was more alert and felt better. We placed her back on her medications for depression-Lexapro and Cymbalta. Her chest xray remained stable. Her CXR today showed stable subcutaneous emphysema, a small pleural effusion on the left, and no pneumothorax. She is ambulating with limited assistance, she has had a bowel movement, she is tolerating room air, her incisions are healing well and she is ready for discharge home. She received tramadol once and tolerated it well. I encouraged her to use Tylenol for pain but also wrote for a few Tramadol incase she needs them. Asked to use pain medication sparingly.     Consults: None  Significant Diagnostic Studies:  EXAM: CT CHEST WITHOUT CONTRAST  TECHNIQUE: Multidetector CT imaging of the chest was performed following the standard protocol without IV contrast.  COMPARISON:  PET-CT 09/04/2018. Low-dose lung cancer screening chest CT 03/19/2018.  FINDINGS: Cardiovascular: Heart size is normal. There is no significant pericardial fluid, thickening or pericardial calcification. There is aortic atherosclerosis, as well as atherosclerosis of the great vessels of the mediastinum and the coronary arteries, including calcified atherosclerotic plaque in the left main, left anterior descending, left circumflex and right coronary arteries.  Mediastinum/Nodes: No pathologically enlarged mediastinal or hilar lymph nodes. Please note that accurate exclusion of hilar adenopathy is limited on noncontrast CT scans. Esophagus is unremarkable in appearance. No axillary lymphadenopathy.  Lungs/Pleura: Previously noted macrolobular slightly spiculated nodule in the left lower lobe (axial image 113 of series 8 and sagittal image  152 of series 6) continues to enlarge, currently measuring 1.7 x 1.7 x 1.4 cm on today's examination, highly concerning for enlarging primary bronchogenic adenocarcinoma. In addition, on today's examination there is a new lingular nodule (axial image 81 of series 2) measuring 1.0 x 1.1 cm. No acute consolidative airspace disease. No pleural effusions. Diffuse bronchial wall thickening with mild centrilobular and paraseptal emphysema.  Upper Abdomen: Aortic atherosclerosis.  Musculoskeletal: There are no aggressive appearing lytic or blastic lesions noted in the visualized portions of the skeleton.  IMPRESSION: 1. Continued enlargement of aggressive appearing nodule in the left lower lobe which remains highly concerning for primary bronchogenic adenocarcinoma. 2. In addition, there is a new lingular nodule which is concerning for second neoplasm. 3. No mediastinal or hilar lymphadenopathy noted on today's examination. 4. Aortic atherosclerosis, in addition to left main and 3 vessel coronary artery disease. Please note that although the presence of coronary artery calcium documents the presence of coronary artery disease, the severity of this disease and any potential stenosis cannot be assessed on this non-gated CT examination. Assessment for potential risk factor modification, dietary therapy or pharmacologic therapy may be warranted, if clinically indicated. 5. Diffuse bronchial wall thickening with mild centrilobular and paraseptal emphysema; imaging findings suggestive of underlying COPD.  Aortic Atherosclerosis (ICD10-I70.0) and  Emphysema (ICD10-J43.9).   Electronically Signed   By: Vinnie Langton M.D.   On: 07/07/2019 20:45  Treatments:   Andrea Kline is a 63 year old woman with a history of tobacco abuse, positive PPD, vertigo, reflux, hyperlipidemia, hypothyroidism, lower GI bleed, arthritis, bursitis, and diverticulitis.  She has smoked about a pack of  cigarettes daily dating back to age 11 (57 pack years).  She started in the lung cancer screening program in 2018.  She was found to have a 10 mm left lower lobe lung nodule.  On PET there was no significant FDG uptake.  A follow-up CT in May 2019 showed the nodule had increased to 12 mm.  She had a follow-up with a PET CT in October 2019.  The nodule was 13 mm and had an SUV max of 1.6.  Her follow-up CT was delayed due to COVID this year, but she finally had the CT done in August.  The left lower lobe nodule had increased in size to 17 mm.  There was a new lingular nodule that measured 1 x 1.1 cm.  PET/CT on 07/18/2019 showed 1.5 cm lingular nodule with an SUV of 4.7.  The lower lobe nodule had mild FDG uptake with an SUV max of 1.48.  She continues to smoke about a pack or a little over a pack of cigarettes daily.  She gets short of breath with exertion.  Ambulation is limited by vertigo and she walks with a cane.  She says that she can walk about a half a mile on level ground without getting short of breath.  She is not having any chest pain, pressure, or tightness.  She has not had any change in appetite.  She has gained some weight with COVID isolation.     Discharge Exam: Blood pressure (!) 150/87, pulse 84, temperature 98.1 F (36.7 C), temperature source Oral, resp. rate 18, height 5\' 5"  (1.651 m), weight 81.3 kg, SpO2 98 %.    General appearance: alert, cooperative and no distress Heart: regular rate and rhythm, S1, S2 normal, no murmur, click, rub or gallop Lungs: clear to auscultation bilaterally Abdomen: soft, non-tender; bowel sounds normal; no masses,  no organomegaly Extremities: extremities normal, atraumatic, no cyanosis or edema Wound: clean and dry  Disposition: Discharge disposition: 01-Home or Self Care        Allergies as of 08/25/2019      Reactions   Advair Diskus [fluticasone-salmeterol]    Can't breath/coughing   Codeine    Dizziness/nausea as a teenager       Medication List    STOP taking these medications   ALKA-SELTZER PLUS COLD PO     TAKE these medications   acetaminophen 500 MG tablet Commonly known as: TYLENOL Take 1,000 mg by mouth every 6 (six) hours as needed for moderate pain.   atorvastatin 10 MG tablet Commonly known as: LIPITOR Take 10 mg by mouth daily.   celecoxib 200 MG capsule Commonly known as: CELEBREX Take 200 mg by mouth daily.   diazepam 10 MG tablet Commonly known as: VALIUM Take 5-10 mg by mouth daily as needed (vertigo).   escitalopram 20 MG tablet Commonly known as: LEXAPRO Take 20 mg by mouth daily.   MULTIVITAMIN PO Take 1 tablet by mouth daily.   omeprazole 40 MG capsule Commonly known as: PRILOSEC Take 40 mg by mouth daily.   ProAir HFA 108 (90 Base) MCG/ACT inhaler Generic drug: albuterol Inhale 2 puffs into the lungs every 6 (six) hours as  needed for wheezing or shortness of breath.   traMADol 50 MG tablet Commonly known as: ULTRAM Take 1 tablet (50 mg total) by mouth every 12 (twelve) hours as needed (mild pain).   VISINE OP Apply 1 drop to eye daily as needed (dry eyes).   VITAMIN D3 PO Take 1 capsule by mouth daily.   ZzzQuil 25 MG Caps Generic drug: diphenhydrAMINE HCl (Sleep) Take 50 mg by mouth at bedtime as needed (sleep).      Follow-up Information    [place], Christiane Ha, MD. Call in 1 day(s).   Specialty: Internal Medicine Contact information: [address] [place] [address] [phone]        Melrose Nakayama, MD Follow up.   Specialty: Cardiothoracic Surgery Why: Your follow-up is on 09/09/2019 at 12:45pm. PLease arrive at 12:15pm for a chest xray located at [facility] which is on the first floor of our building.  Contact information: [address] [place] [address] [phone]           Signed: Elgie Collard 08/25/2019, 9:09 AM

## 2019-08-23 ENCOUNTER — Inpatient Hospital Stay (HOSPITAL_COMMUNITY): Payer: PPO

## 2019-08-23 LAB — COMPREHENSIVE METABOLIC PANEL
ALT: 15 U/L (ref 0–44)
AST: 18 U/L (ref 15–41)
Albumin: 3 g/dL — ABNORMAL LOW (ref 3.5–5.0)
Alkaline Phosphatase: 102 U/L (ref 38–126)
Anion gap: 7 (ref 5–15)
BUN: 11 mg/dL (ref 8–23)
CO2: 27 mmol/L (ref 22–32)
Calcium: 8.4 mg/dL — ABNORMAL LOW (ref 8.9–10.3)
Chloride: 103 mmol/L (ref 98–111)
Creatinine, Ser: 0.53 mg/dL (ref 0.44–1.00)
GFR calc Af Amer: >60 mL/min (ref >60)
GFR calc non Af Amer: >60 mL/min (ref >60)
Glucose, Bld: 108 mg/dL — ABNORMAL HIGH (ref 70–99)
Potassium: 3.9 mmol/L (ref 3.5–5.1)
Sodium: 137 mmol/L (ref 135–145)
Total Bilirubin: 0.4 mg/dL (ref 0.3–1.2)
Total Protein: 5.8 g/dL — ABNORMAL LOW (ref 6.5–8.1)

## 2019-08-23 LAB — CBC
HCT: 37.3 % (ref 36.0–46.0)
Hemoglobin: 11.8 g/dL — ABNORMAL LOW (ref 12.0–15.0)
MCH: 32.9 pg (ref 26.0–34.0)
MCHC: 31.6 g/dL (ref 30.0–36.0)
MCV: 103.9 fL — ABNORMAL HIGH (ref 80.0–100.0)
Platelets: 192 10*3/uL (ref 150–400)
RBC: 3.59 MIL/uL — ABNORMAL LOW (ref 3.87–5.11)
RDW: 13 % (ref 11.5–15.5)
WBC: 10.8 10*3/uL — ABNORMAL HIGH (ref 4.0–10.5)
nRBC: 0 % (ref 0.0–0.2)

## 2019-08-23 MED ORDER — MORPHINE SULFATE (PF) 2 MG/ML IV SOLN: 1.0000 mg | INTRAVENOUS | Status: DC | PRN

## 2019-08-23 MED ORDER — IPRATROPIUM-ALBUTEROL 0.5-2.5 (3) MG/3ML IN SOLN
3.0000 mL | Freq: Two times a day (BID) | RESPIRATORY_TRACT | Status: DC
  Administered 2019-08-23: 3 mL via RESPIRATORY_TRACT
  Filled 2019-08-23: qty 3

## 2019-08-23 MED ORDER — ONDANSETRON HCL 4 MG/2ML IJ SOLN
4.0000 mg | Freq: Four times a day (QID) | INTRAMUSCULAR | Status: DC | PRN
  Administered 2019-08-23: 4 mg via INTRAVENOUS
  Filled 2019-08-23: qty 2

## 2019-08-23 MED ORDER — METOPROLOL TARTRATE 5 MG/5ML IV SOLN
2.5000 mg | Freq: Four times a day (QID) | INTRAVENOUS | Status: DC | PRN
  Administered 2019-08-23: 2.5 mg via INTRAVENOUS
  Filled 2019-08-23: qty 5

## 2019-08-23 MED ORDER — IPRATROPIUM-ALBUTEROL 0.5-2.5 (3) MG/3ML IN SOLN
3.0000 mL | Freq: Four times a day (QID) | RESPIRATORY_TRACT | Status: DC | PRN
  Administered 2019-08-24 – 2019-08-25 (×2): 3 mL via RESPIRATORY_TRACT
  Filled 2019-08-23 (×2): qty 3

## 2019-08-23 NOTE — Progress Notes (Addendum)
This morning pt's chest tube removed, post CXR shows small pneumothorax rt apical, PA Erin informed, pt's vitals stable, metoprolol given for high BP  Foley catheter removed, PCA pump dc, pt's sister [name] is visiting and updated, pt switched to chair from bed she refused to walk, she said she has a vertigo and she can't walk this time will try later  PCA extra medicine Dilaudid 18 cc is disposed to stericycle with verification of second Nurse Mliss Sax.   Palma Holter, RN

## 2019-08-23 NOTE — Progress Notes (Addendum)
      [address]       [place] [address]             [phone]      2 Days Post-Op Procedure(s) (LRB): VIDEO ASSISTED THORACOSCOPY (VATS) LEFT LOWER LUNG WEDGE RESECTION (Left) LEFT  LINGULAR SEGMENTECTOMY (Left) Node Dissection Chest Tube Insertion (Left)   Subjective:  Patient awoken from sleep.  She is drowsy, somnolent.  She states she doesn't feel well.  She complains of pain, headache, and being depressed.  + ambulation  Objective: Vital signs in last 24 hours: Temp:  [97.5 F (36.4 C)-97.9 F (36.6 C)] 97.8 F (36.6 C) (10/17 0708) Pulse Rate:  [57-105] 105 (10/17 0851) Cardiac Rhythm: Normal sinus rhythm (10/17 0740) Resp:  [15-21] 18 (10/17 0851) BP: (100-172)/(71-80) 172/71 (10/17 0708) SpO2:  [90 %-98 %] 92 % (10/17 0851) Arterial Line BP: (130)/(73) 130/73 (10/16 1200)  Intake/Output from previous day: 10/16 0701 - 10/17 0700 In: 173.5 [I.V.:173.5] Out: 1025 [Urine:825; Chest Tube:200]  General appearance: appears over sedated, drowsy Heart: regular rate and rhythm Lungs: clear to auscultation bilaterally Abdomen: soft, non-tender; bowel sounds normal; no masses,  no organomegaly Extremities: extremities normal, atraumatic, no cyanosis or edema Wound: clean and dry  Lab Results: Recent Labs    08/22/19 0431 08/23/19 0320  WBC 13.1* 10.8*  HGB 12.3 11.8*  HCT 37.9 37.3  PLT 194 192   BMET:  Recent Labs    08/22/19 0431 08/23/19 0320  NA 137 137  K 4.3 3.9  CL 106 103  CO2 24 27  GLUCOSE 111* 108*  BUN 9 11  CREATININE 0.70 0.53  CALCIUM 8.2* 8.4*    PT/INR: No results for input(s): LABPROT, INR in the last 72 hours. ABG    Component Value Date/Time   PHART 7.283 (L) 08/22/2019 0420   HCO3 25.9 08/22/2019 0420   TCO2 25 08/21/2019 1016   ACIDBASEDEF 0.1 08/22/2019 0420   O2SAT 95.5 08/22/2019 0420   CBG (last 3)  No results for input(s): GLUCAP in the last 72 hours.  Assessment/Plan: S/P Procedure(s)  (LRB): VIDEO ASSISTED THORACOSCOPY (VATS) LEFT LOWER LUNG WEDGE RESECTION (Left) LEFT  LINGULAR SEGMENTECTOMY (Left) Node Dissection Chest Tube Insertion (Left)  1. CV- NSR, + HTN with SBP in the 170s- likely attributed to pain, patient has no prior history, will treat with Lopressor 2.5-5 mg prn q6, as hydralazine is on shortage.. if doesn't improve, will need to start oral agent 2. Pulm- no air leak present, CXR is free from pneumothorax, 200 cc output yesterday, will d/c chest tube today 3. D/C Foley catheter 4. D/C PCA- will treat with oral pain medication, reserve IV morphine for severe discomfort 5. H/O depression on home lexapro, cymbalta 6. Dispo- patient drowsy, somnolent this morning, will d/c PCA and try to utilize oral pain medications as needed, CT w/o air leak will remove, d/c foley, patient needs to ambulate, repeat CXR in AM   LOS: 2 days    Ellwood Handler 08/23/2019  Resting comfortably after chest tube removed Plan chest x-ray in a.m. patient examined and medical record reviewed,agree with above note. Tharon Aquas Trigt III 08/23/2019

## 2019-08-24 ENCOUNTER — Other Ambulatory Visit: Payer: Self-pay

## 2019-08-24 ENCOUNTER — Inpatient Hospital Stay (HOSPITAL_COMMUNITY): Payer: PPO

## 2019-08-24 MED ORDER — SORBITOL 70 % SOLN
60.0000 mL | Freq: Once | Status: AC
  Administered 2019-08-24: 60 mL via ORAL
  Filled 2019-08-24: qty 60

## 2019-08-24 MED ORDER — SORBITOL 70 % PO SOLN: 60.0000 mL | Freq: Once | ORAL | Status: DC

## 2019-08-24 NOTE — Progress Notes (Signed)
Pt ambulated in a hallway twice without any complain of sob, chest pain and distress. Vitals stable, Sorbitol worked for [employer], willing to be discharged tomorrow, her sister [name] was in bed side and is updated, will continue to monitor the patient  Palma Holter, Therapist, sports

## 2019-08-24 NOTE — Progress Notes (Addendum)
      [address]       [place] [address]             [phone]      3 Days Post-Op Procedure(s) (LRB): VIDEO ASSISTED THORACOSCOPY (VATS) LEFT LOWER LUNG WEDGE RESECTION (Left) LEFT  LINGULAR SEGMENTECTOMY (Left) Node Dissection Chest Tube Insertion (Left)   Subjective:  Patient states she is tired this morning.  More alert and overall feels better after her chest tube has been removed.  She states her ambulation has been slow.  She has not yet moved her bowels.  Objective: Vital signs in last 24 hours: Temp:  [98 F (36.7 C)-98.4 F (36.9 C)] 98.1 F (36.7 C) (10/18 0730) Pulse Rate:  [78-107] 107 (10/18 0435) Cardiac Rhythm: Sinus tachycardia (10/18 0709) Resp:  [16-23] 22 (10/18 0730) BP: (118-162)/(53-88) 134/79 (10/18 0730) SpO2:  [71 %-94 %] 94 % (10/18 0730)  Intake/Output from previous day: 10/17 0701 - 10/18 0700 In: 480 [P.O.:480] Out: 902 [Urine:902] Intake/Output this shift: Total I/O In: 240 [P.O.:240] Out: 300 [Urine:300]  General appearance: alert, cooperative and no distress Heart: regular rate and rhythm Lungs: clear to auscultation bilaterally Abdomen: soft, non-tender; bowel sounds normal; no masses,  no organomegaly Extremities: extremities normal, atraumatic, no cyanosis or edema Wound: clean and dry  Lab Results: Recent Labs    08/22/19 0431 08/23/19 0320  WBC 13.1* 10.8*  HGB 12.3 11.8*  HCT 37.9 37.3  PLT 194 192   BMET:  Recent Labs    08/22/19 0431 08/23/19 0320  NA 137 137  K 4.3 3.9  CL 106 103  CO2 24 27  GLUCOSE 111* 108*  BUN 9 11  CREATININE 0.70 0.53  CALCIUM 8.2* 8.4*    PT/INR: No results for input(s): LABPROT, INR in the last 72 hours. ABG    Component Value Date/Time   PHART 7.283 (L) 08/22/2019 0420   HCO3 25.9 08/22/2019 0420   TCO2 25 08/21/2019 1016   ACIDBASEDEF 0.1 08/22/2019 0420   O2SAT 95.5 08/22/2019 0420   CBG (last 3)  No results for input(s): GLUCAP in the last 72  hours.  Assessment/Plan: S/P Procedure(s) (LRB): VIDEO ASSISTED THORACOSCOPY (VATS) LEFT LOWER LUNG WEDGE RESECTION (Left) LEFT  LINGULAR SEGMENTECTOMY (Left) Node Dissection Chest Tube Insertion (Left)  1. CV- NSR, BP much improved 2. Pulm- CT removed yesterday, no definitive pneumothorax observed, continued atelectasis/small effusion on left 3. H/O Depression- on Lexapro, Cymbalta 4. Activity- not ambulating much, encouraged patient to walk more today 5. Dispo- patient stable, HTN improved, no pneumothorax on CXR, continue IS to work on atelectasis, if remains stable possibly ready for d/c in AM   LOS: 3 days    Ellwood Handler 08/24/2019  Patient examined and today's chest x-ray reviewed Showing progressive improvement and should be ready for discharge tomorrow Patient will be assisted by her sister at time of discharge  patient examined and medical record reviewed,agree with above note. Tharon Aquas Trigt III 08/24/2019

## 2019-08-24 NOTE — Plan of Care (Signed)

## 2019-08-25 ENCOUNTER — Inpatient Hospital Stay (HOSPITAL_COMMUNITY): Payer: PPO

## 2019-08-25 LAB — SURGICAL PATHOLOGY

## 2019-08-25 MED ORDER — TRAMADOL HCL 50 MG PO TABS: 50.0000 mg | ORAL_TABLET | Freq: Two times a day (BID) | ORAL | 0 refills | Status: DC | PRN

## 2019-08-25 NOTE — Progress Notes (Addendum)
      [address]       [place] [address]             [phone]      4 Days Post-Op Procedure(s) (LRB): VIDEO ASSISTED THORACOSCOPY (VATS) LEFT LOWER LUNG WEDGE RESECTION (Left) LEFT  LINGULAR SEGMENTECTOMY (Left) Node Dissection Chest Tube Insertion (Left) Subjective: Feels good this morning. Asking for the pathology.   Objective: Vital signs in last 24 hours: Temp:  [98 F (36.7 C)-98.3 F (36.8 C)] 98.1 F (36.7 C) (10/19 0431) Pulse Rate:  [84-140] 84 (10/19 0431) Cardiac Rhythm: Normal sinus rhythm (10/19 0712) Resp:  [18-20] 18 (10/19 0431) BP: (141-156)/(87-104) 150/87 (10/19 0431) SpO2:  [92 %-98 %] 98 % (10/19 0431)     Intake/Output from previous day: 10/18 0701 - 10/19 0700 In: 779.7 [P.O.:720; I.V.:59.7] Out: 1101 [Urine:1100; Stool:1] Intake/Output this shift: No intake/output data recorded.  General appearance: alert, cooperative and no distress Heart: regular rate and rhythm, S1, S2 normal, no murmur, click, rub or gallop Lungs: clear to auscultation bilaterally Abdomen: soft, non-tender; bowel sounds normal; no masses,  no organomegaly Extremities: extremities normal, atraumatic, no cyanosis or edema Wound: clean and dry  Lab Results: Recent Labs    08/23/19 0320  WBC 10.8*  HGB 11.8*  HCT 37.3  PLT 192   BMET:  Recent Labs    08/23/19 0320  NA 137  K 3.9  CL 103  CO2 27  GLUCOSE 108*  BUN 11  CREATININE 0.53  CALCIUM 8.4*    PT/INR: No results for input(s): LABPROT, INR in the last 72 hours. ABG    Component Value Date/Time   PHART 7.283 (L) 08/22/2019 0420   HCO3 25.9 08/22/2019 0420   TCO2 25 08/21/2019 1016   ACIDBASEDEF 0.1 08/22/2019 0420   O2SAT 95.5 08/22/2019 0420   CBG (last 3)  No results for input(s): GLUCAP in the last 72 hours.  Assessment/Plan: S/P Procedure(s) (LRB): VIDEO ASSISTED THORACOSCOPY (VATS) LEFT LOWER LUNG WEDGE RESECTION (Left) LEFT  LINGULAR SEGMENTECTOMY (Left) Node  Dissection Chest Tube Insertion (Left)  1. Hypertensive at times. NSR in the 80s.  2. CXR shows: Recent postsurgical change in the left hemithorax with residual atelectasis and probable small effusion at the lung base. No definite pneumothorax. Unchanged subcutaneous emphysema in the left chest wall. 3. Renal-creatinine 8.4 4. H and H 11.8/37.3 5. H/O depression-continue home Lexapro and Cymbalta  Plan: Pathology in progress. Home today. Discharge instructions given.   LOS: 4 days    Elgie Collard 08/25/2019 Patient seen and examined, agree with above  Remo Lipps C. Roxan Hockey, MD [facility] [phone]

## 2019-08-25 NOTE — Plan of Care (Signed)
Patient discharged home.

## 2019-08-28 ENCOUNTER — Encounter: Payer: Self-pay | Admitting: *Deleted

## 2019-08-28 ENCOUNTER — Other Ambulatory Visit: Payer: Self-pay | Admitting: *Deleted

## 2019-08-28 ENCOUNTER — Ambulatory Visit: Payer: PPO | Admitting: Pulmonary Disease

## 2019-08-28 NOTE — Progress Notes (Signed)
The proposed treatment discussed at cancer conference 08/28/19 is for discussion purpose only and is not a binding recommendation.  The patient was not physically examined nor present for their treatment options.  Therefore, final treatment plans cannot be decided.

## 2019-08-29 DIAGNOSIS — K5901 Slow transit constipation: Secondary | ICD-10-CM | POA: Diagnosis not present

## 2019-08-29 DIAGNOSIS — Z23 Encounter for immunization: Secondary | ICD-10-CM | POA: Diagnosis not present

## 2019-08-29 DIAGNOSIS — Z85118 Personal history of other malignant neoplasm of bronchus and lung: Secondary | ICD-10-CM | POA: Diagnosis not present

## 2019-09-09 ENCOUNTER — Other Ambulatory Visit: Payer: Self-pay | Admitting: Thoracic Surgery (Cardiothoracic Vascular Surgery)

## 2019-09-09 ENCOUNTER — Other Ambulatory Visit: Payer: Self-pay

## 2019-09-09 ENCOUNTER — Ambulatory Visit (INDEPENDENT_AMBULATORY_CARE_PROVIDER_SITE_OTHER): Payer: Self-pay | Admitting: Thoracic Surgery (Cardiothoracic Vascular Surgery)

## 2019-09-09 ENCOUNTER — Encounter: Payer: Self-pay | Admitting: Thoracic Surgery (Cardiothoracic Vascular Surgery)

## 2019-09-09 ENCOUNTER — Other Ambulatory Visit: Payer: Self-pay | Admitting: *Deleted

## 2019-09-09 ENCOUNTER — Ambulatory Visit
Admission: RE | Admit: 2019-09-09 | Discharge: 2019-09-09 | Disposition: A | Discharge status: Home / Self Care | Payer: PPO | Source: Ambulatory Visit | Attending: Thoracic Surgery (Cardiothoracic Vascular Surgery) | Admitting: Thoracic Surgery (Cardiothoracic Vascular Surgery)

## 2019-09-09 VITALS — BP 136/90 | HR 92 | Temp 97.5°F | Resp 16 | Ht 65.0 in | Wt 178.0 lb

## 2019-09-09 DIAGNOSIS — J984 Other disorders of lung: Secondary | ICD-10-CM | POA: Diagnosis not present

## 2019-09-09 DIAGNOSIS — C3432 Malignant neoplasm of lower lobe, left bronchus or lung: Secondary | ICD-10-CM

## 2019-09-09 DIAGNOSIS — C3492 Malignant neoplasm of unspecified part of left bronchus or lung: Secondary | ICD-10-CM

## 2019-09-09 DIAGNOSIS — Z9889 Other specified postprocedural states: Secondary | ICD-10-CM

## 2019-09-09 DIAGNOSIS — J90 Pleural effusion, not elsewhere classified: Secondary | ICD-10-CM | POA: Diagnosis not present

## 2019-09-09 DIAGNOSIS — C3490 Malignant neoplasm of unspecified part of unspecified bronchus or lung: Secondary | ICD-10-CM

## 2019-09-09 DIAGNOSIS — R918 Other nonspecific abnormal finding of lung field: Secondary | ICD-10-CM | POA: Diagnosis not present

## 2019-09-09 DIAGNOSIS — Z09 Encounter for follow-up examination after completed treatment for conditions other than malignant neoplasm: Secondary | ICD-10-CM

## 2019-09-09 MED ORDER — PREGABALIN 25 MG PO CAPS: 25.0000 mg | ORAL_CAPSULE | Freq: Two times a day (BID) | ORAL | 1 refills | Status: DC
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
## 2019-09-11 ENCOUNTER — Telehealth: Payer: Self-pay | Admitting: Internal Medicine

## 2019-09-11 NOTE — Telephone Encounter (Signed)
Pt has been cld and scheduled to see Dr. [name] on 11/6 at 1030am w/labs at [facility]. Ms. Zobrist has been made aware to arrive 15 minutes early.

## 2019-09-12 ENCOUNTER — Encounter: Payer: Self-pay | Admitting: Internal Medicine

## 2019-09-12 ENCOUNTER — Inpatient Hospital Stay: Payer: PPO | Attending: Internal Medicine | Admitting: Internal Medicine

## 2019-09-12 ENCOUNTER — Other Ambulatory Visit: Payer: Self-pay

## 2019-09-12 ENCOUNTER — Inpatient Hospital Stay: Payer: PPO

## 2019-09-12 VITALS — BP 134/78 | HR 92 | Temp 97.9°F | Resp 17 | Ht 65.0 in | Wt 177.7 lb

## 2019-09-12 DIAGNOSIS — E785 Hyperlipidemia, unspecified: Secondary | ICD-10-CM | POA: Diagnosis not present

## 2019-09-12 DIAGNOSIS — R5383 Other fatigue: Secondary | ICD-10-CM | POA: Diagnosis not present

## 2019-09-12 DIAGNOSIS — F17210 Nicotine dependence, cigarettes, uncomplicated: Secondary | ICD-10-CM | POA: Diagnosis not present

## 2019-09-12 DIAGNOSIS — C3432 Malignant neoplasm of lower lobe, left bronchus or lung: Secondary | ICD-10-CM | POA: Diagnosis not present

## 2019-09-12 DIAGNOSIS — E039 Hypothyroidism, unspecified: Secondary | ICD-10-CM | POA: Insufficient documentation

## 2019-09-12 DIAGNOSIS — J449 Chronic obstructive pulmonary disease, unspecified: Secondary | ICD-10-CM | POA: Insufficient documentation

## 2019-09-12 DIAGNOSIS — F329 Major depressive disorder, single episode, unspecified: Secondary | ICD-10-CM | POA: Insufficient documentation

## 2019-09-12 DIAGNOSIS — Z801 Family history of malignant neoplasm of trachea, bronchus and lung: Secondary | ICD-10-CM | POA: Diagnosis not present

## 2019-09-12 DIAGNOSIS — E7800 Pure hypercholesterolemia, unspecified: Secondary | ICD-10-CM | POA: Diagnosis not present

## 2019-09-12 DIAGNOSIS — R0789 Other chest pain: Secondary | ICD-10-CM | POA: Diagnosis not present

## 2019-09-12 DIAGNOSIS — R319 Hematuria, unspecified: Secondary | ICD-10-CM

## 2019-09-12 DIAGNOSIS — R05 Cough: Secondary | ICD-10-CM

## 2019-09-12 DIAGNOSIS — R091 Pleurisy: Secondary | ICD-10-CM | POA: Diagnosis not present

## 2019-09-12 DIAGNOSIS — Z791 Long term (current) use of non-steroidal anti-inflammatories (NSAID): Secondary | ICD-10-CM | POA: Insufficient documentation

## 2019-09-12 DIAGNOSIS — Z803 Family history of malignant neoplasm of breast: Secondary | ICD-10-CM

## 2019-09-12 DIAGNOSIS — F419 Anxiety disorder, unspecified: Secondary | ICD-10-CM | POA: Diagnosis not present

## 2019-09-12 DIAGNOSIS — C3492 Malignant neoplasm of unspecified part of left bronchus or lung: Secondary | ICD-10-CM

## 2019-09-12 DIAGNOSIS — Z79899 Other long term (current) drug therapy: Secondary | ICD-10-CM | POA: Diagnosis not present

## 2019-09-12 DIAGNOSIS — Z800 Family history of malignant neoplasm of digestive organs: Secondary | ICD-10-CM

## 2019-09-12 DIAGNOSIS — Z5111 Encounter for antineoplastic chemotherapy: Secondary | ICD-10-CM | POA: Insufficient documentation

## 2019-09-12 DIAGNOSIS — K219 Gastro-esophageal reflux disease without esophagitis: Secondary | ICD-10-CM

## 2019-09-12 LAB — URINALYSIS, COMPLETE (UACMP) WITH MICROSCOPIC
Bacteria, UA: NONE SEEN
Bilirubin Urine: NEGATIVE
Glucose, UA: NEGATIVE mg/dL
Ketones, ur: NEGATIVE mg/dL
Leukocytes,Ua: NEGATIVE
Nitrite: NEGATIVE
Protein, ur: NEGATIVE mg/dL
Specific Gravity, Urine: 1.023 (ref 1.005–1.030)
pH: 7 (ref 5.0–8.0)
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
## 2019-09-15 ENCOUNTER — Telehealth: Payer: Self-pay | Admitting: Internal Medicine

## 2019-09-15 NOTE — Telephone Encounter (Signed)
Scheduled per los. Called and left msg. Mailed printout  °

## 2019-09-18 ENCOUNTER — Telehealth: Payer: Self-pay

## 2019-09-18 NOTE — Telephone Encounter (Signed)
Ms Yeaman called requesting lab results from 09/12/2019.  VM left that call was returned.

## 2019-09-19 ENCOUNTER — Ambulatory Visit (HOSPITAL_COMMUNITY)
Admission: RE | Admit: 2019-09-19 | Discharge: 2019-09-19 | Disposition: A | Discharge status: Home / Self Care | Payer: PPO | Source: Ambulatory Visit | Attending: Internal Medicine | Admitting: Internal Medicine

## 2019-09-19 ENCOUNTER — Other Ambulatory Visit: Payer: Self-pay

## 2019-09-19 ENCOUNTER — Telehealth: Payer: Self-pay | Admitting: *Deleted

## 2019-09-19 DIAGNOSIS — C3490 Malignant neoplasm of unspecified part of unspecified bronchus or lung: Secondary | ICD-10-CM | POA: Diagnosis not present

## 2019-09-19 DIAGNOSIS — R319 Hematuria, unspecified: Secondary | ICD-10-CM | POA: Insufficient documentation

## 2019-09-19 MED ORDER — GADOBUTROL 1 MMOL/ML IV SOLN
8.0000 mL | Freq: Once | INTRAVENOUS | Status: AC | PRN
  Administered 2019-09-19: 8 mL via INTRAVENOUS

## 2019-09-19 NOTE — Telephone Encounter (Signed)
Oncology Nurse Navigator Documentation  Oncology Nurse Navigator Flowsheets 09/19/2019  Navigator Location [facility]  Navigator Encounter Type Telephone  Telephone Outgoing Call  Barriers/Navigation Needs Education/I received a message from leader that patient wrote an email needing a information packet.  I called patient to see what I can do to help her.  I was unable to reach but did leave a vm message with my name and phone number to call.   Education Other  Interventions Education  Acuity Level 2-Minimal Needs (1-2 Barriers Identified)  Education Method Verbal  Time Spent with Patient 30

## 2019-09-19 NOTE — Telephone Encounter (Signed)
Oncology Nurse Navigator Documentation  Oncology Nurse Navigator Flowsheets 09/19/2019  Navigator Location [facility]  Navigator Encounter Type Telephone/Andrea Kline called me back today.  She states she felt unimportant at her visit last week.  I listened as she explained.  She states she would like to talk with someone about her feelings. I suggested CSW and she was ok with talking with her.  I asked if she needed to come and have another appt to talk about plan of care to clarify any questions or concerns. She states she would like that.  I updated CSW on patient and scheduling to call and set up a follow up appt.  I also updated Dr. [name] and [facility] on the above information.    Telephone Incoming Call  Barriers/Navigation Needs Coordination of Care;Education  Education Other  Interventions Coordination of Care;Education  Acuity Level 3-Moderate Needs (3-4 Barriers Identified)  Coordination of Care Other  Education Method Verbal  Time Spent with Patient 56

## 2019-09-22 ENCOUNTER — Encounter: Payer: Self-pay | Admitting: *Deleted

## 2019-09-22 ENCOUNTER — Telehealth: Payer: Self-pay | Admitting: *Deleted

## 2019-09-22 ENCOUNTER — Telehealth: Payer: Self-pay | Admitting: Internal Medicine

## 2019-09-22 NOTE — Progress Notes (Signed)
Oncology Nurse Navigator Documentation  Oncology Nurse Navigator Flowsheets 09/22/2019  Navigator Location [facility]  Navigator Encounter Type Letter/Fax/Email  Telephone -  Barriers/Navigation Needs Education/per Ms. Orsino's and my conversation on 11/13, I sent her educational information in the mail.   Education Newly Diagnosed Cancer Education;Understanding Cancer/ Treatment Options;Other  Interventions Education  Acuity Level 2-Minimal Needs (1-2 Barriers Identified)  Coordination of Care -  Education Method Written  Time Spent with Patient 15

## 2019-09-22 NOTE — Telephone Encounter (Signed)
Pt is requesting results of today's scan

## 2019-09-22 NOTE — Telephone Encounter (Signed)
Patient requesting MRI results from Brain MRI.  Per Cassie Heilingoetter, PA ok to review results over phone with patient as they were normal.    Patient also questioned the results of her Urinalysis from 09/12/2019 stating that someone called but didn't leave a number to call back to discuss those.  She also questioned another call she missed for scheduling.    Patient had several concerns for her future care since this diagnosis was new to her.  Listened and reassured her that we could assist her in any way to understand her diagnosis and to feel free to use myChart for questions, also will ask PA to include any notes on AVS for patient.  Patient also expressed some saddness and the number for Harrell Lark at [facility] was given to the patient.

## 2019-09-22 NOTE — Telephone Encounter (Signed)
Scheduled appt per 11/13 sch message - unable to reach pt . Left message with appt date and time

## 2019-09-23 ENCOUNTER — Encounter: Payer: Self-pay | Admitting: *Deleted

## 2019-09-23 NOTE — Progress Notes (Signed)
Oncology Nurse Navigator Documentation  Oncology Nurse Navigator Flowsheets 09/23/2019  Abnormal Finding Date 07/07/2019  Confirmed Diagnosis Date 08/21/2019  Diagnosis Status Confirmed Diagnosis Complete  Expected Surgery Date 08/21/2019  Surgery Actual Start Date: 08/21/2019  Navigator Location [facility]  Navigator Encounter Type Other/I followed up on Ms. Paver's schedule. She is set up to see Cassie PA-C next week for a follow up discussion.  No other barriers identified at this time.   Telephone -  Treatment Initiated Date 08/21/2019  Barriers/Navigation Needs Coordination of Care  Education -  Interventions Coordination of Care  Acuity Level 2-Minimal Needs (1-2 Barriers Identified)  Coordination of Care Other  Education Method -  Time Spent with Patient 15

## 2019-09-24 ENCOUNTER — Encounter: Payer: Self-pay | Admitting: *Deleted

## 2019-09-24 NOTE — Progress Notes (Signed)
[place] Work  Clinical Social Work was referred by Development worker, international aid for assessment of psychosocial needs and counseling.  Clinical Social Worker attempted to contact patient by phone to offer support and assess for needs.  CSW left voicemail requesting patient return call.   Gwinda Maine, LCSW  Clinical Social Worker [facility]

## 2019-09-29 ENCOUNTER — Other Ambulatory Visit: Payer: Self-pay

## 2019-09-29 ENCOUNTER — Encounter: Payer: Self-pay | Admitting: Physician Assistant

## 2019-09-29 ENCOUNTER — Inpatient Hospital Stay (HOSPITAL_BASED_OUTPATIENT_CLINIC_OR_DEPARTMENT_OTHER): Payer: PPO | Admitting: Physician Assistant

## 2019-09-29 VITALS — BP 155/92 | HR 103 | Temp 98.2°F | Resp 18 | Ht 65.0 in | Wt 181.1 lb

## 2019-09-29 DIAGNOSIS — C3492 Malignant neoplasm of unspecified part of left bronchus or lung: Secondary | ICD-10-CM | POA: Diagnosis not present

## 2019-09-29 DIAGNOSIS — Z7189 Other specified counseling: Secondary | ICD-10-CM | POA: Diagnosis not present

## 2019-09-29 DIAGNOSIS — C3432 Malignant neoplasm of lower lobe, left bronchus or lung: Secondary | ICD-10-CM | POA: Diagnosis not present
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
## 2019-09-29 NOTE — Patient Instructions (Addendum)
-  Treatment would consist of a combination of either carboplatin or cisplatin and another medication called etoposide.  -This is given three days in a row every 3 weeks. The plan would be to do this 4 times.  -You would have to come back to the clinic two days after chemotherapy to get a shot. The Shot increases your bodies infection fighting cells so that they do not drop too low after treatment -Side effects include nausea, vomiting, hair loss, drop in the blood count, kidney, and liver dysfunction. We will be watching your blood counts closely to make sure they are in an acceptable range.  -Chemotherapy has to be within 3 months of surgery. Your surgery was on October 15th.  -If you would like to move forward with treatment, I will have you meet with one of our nurse educators one on one. They will discuss in more detail information regarding the treatment and what to expect. They also will give you good information of how to manage any side effects sna numbers to call if you need anything. They also give good resources that are offered at the cancer center.  -We have a symptom management clinic here at the cancer center. This is like an urgent care but for patient's who are undergoing chemotherapy. So if you have any issues at anytime, you can all the office and we can get your set up to see the provider in the symptom management clinic if appropriate.  -If you need [place], our main number is [phone], ask to talk to [facility] or Dr. Worthy Flank nurse.  Summary:  -There are two main categories of lung cancer, they are named based on the size of the cancer cell. One is called Non-Small cell lung cancer. The other type is Small Cell Lung Cancer -The sample (biopsy) that they took of your tumor by the lingula was consistent with a subtype of Small cell lung cancer called Small Cell Lung Cancer.  -We covered a lot of important information at your appointment today regarding what the treatment plan is moving  forward. Here are the the main points that were discussed at your office visit with [place] today:  -The treatment that you will receive consists of two chemotherapy drugs, called Carboplatin or Cisplatin and Etoposide -We are planning on starting your treatment next week on _/_/_ but before your start your treatment, I would like you to attend a Chemotherapy Education Class. This involves having you sit down with one of our nurse educators. She will discuss with your one-on-one more details about your treatment as well as general information about resources here at the cancer center.  -Your treatment will be given once every 3 weeks. We will check your labs once a week just to make sure that important components of your blood are in an acceptable range

## 2019-09-30 ENCOUNTER — Encounter: Payer: Self-pay | Admitting: General Practice

## 2019-09-30 ENCOUNTER — Encounter: Payer: Self-pay | Admitting: Internal Medicine

## 2019-09-30 NOTE — Progress Notes (Signed)
[name] CSW Progress Notes  Called patient at request of PA C Helingotter to determine needs.  Called patient at home.  Her situation is complicated - she reports that she is in observation status and "does not have cancer."  She is post surgery which removed areas of concern.  Four rounds of adjuvant chemotherapy has been recommended, but she is more comfortable with observation and initiating further treatment if/when cancer returns.  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  She is no longer able to work, has few friends and is socially isolated. Her mother, father and brother are all deceased.  She is estranged from her surviving sister who lives nearby.  She has few/no friends and little/no social activity.  She does not identify as a "cancer patient", so typical offerings of the [facility] are not what she is looking for.  CSW began to explore her wishes/desires - will need to continue this conversation at another time as she had to terminate the call as the oncologist's office was calling.   Edwyna Shell, LCSW Clinical Social Worker Phone:  [phone] Cell:  [phone]

## 2019-09-30 NOTE — Progress Notes (Signed)
Called patient whom referral was received by RN regarding financial concerns.  Introduced myself as Arboriculturist and listened to her concerns. Advised patient at this time there is no assistance available for her diagnosis and surveillance. Advised if she decides to move forward with chemotherapy, she may apply for the one-time $700 [place] to assist with personal expenses while going through treatment. She had a question regarding payment plans. Advised this would be handled through the billing department once she receives her first bill. Explained how foundation assistance for copays is disbursed all over the world and the timing of when funds may open or close for a particular diagnosis varies. Also advised sometimes there may be drug specific assistance available to apply for but I am unable to give this information without a specific plan. Also advised she may apply for Medicaid as a secondary to her primary insurance and types of information needed would be income and etc. She expressed being upset regarding diagnosis and not being able to receive help unless she moves forward with treatment. She thanked me and hung up.

## 2019-10-01 ENCOUNTER — Encounter: Payer: Self-pay | Admitting: General Practice

## 2019-10-01 NOTE — Progress Notes (Signed)
[facility] CSW Progress Notes  Made call to patient to continue to problem solve and offer resources to meet patient's need. Left VM w my contact information on home and mobile numbers, will await return call.  Edwyna Shell, LCSW Clinical Social Worker Phone:  [phone]

## 2019-10-01 NOTE — Progress Notes (Signed)
[facility] CSW Progress Notes  Return call from patient - she is working on personal ways to cope w stress associated w living w vertigo and recent experience w surgery for cancer.  Gave her links to vertigo support group in [place] area.  Explained option of referral to [facility] counseling intern - she will think this over and recontact CSW if she is interested in connecting w intern.    Edwyna Shell, LCSW Clinical Social Worker Phone:  [phone]

## 2019-10-07 ENCOUNTER — Telehealth: Payer: Self-pay | Admitting: *Deleted

## 2019-10-07 NOTE — Telephone Encounter (Signed)
Received call from patient, expressing sincere appreciation for all of the help our office has given to her.  She apologized for how she handled her diagnosis and She states that she is "getting a hold of what's going on" and is appreciative of our staff.    Returned call to patient to see if this meant that she wanted to proceed with treatment or still keep appointments as scheduled.  Left message, pending call back.

## 2019-10-09 ENCOUNTER — Telehealth: Payer: Self-pay | Admitting: *Deleted

## 2019-10-09 NOTE — Telephone Encounter (Signed)
Oncology Nurse Navigator Documentation  Oncology Nurse Navigator Flowsheets 10/09/2019  Abnormal Finding Date -  Confirmed Diagnosis Date -  Diagnosis Status -  Expected Surgery Date -  Surgery Actual Start Date: -  Navigator Location [facility]  Navigator Encounter Type Telephone/I called to follow up with Andrea Kline today.  I was unable to reach but did leave a vm message to call me if needed.   Telephone Outgoing Call  Treatment Initiated Date -  Barriers/Navigation Needs Education  Education Other  Interventions Education  Acuity Level 2-Minimal Needs (1-2 Barriers Identified)  Coordination of Care -  Education Method Verbal  Time Spent with Patient 15

## 2019-10-14 ENCOUNTER — Encounter: Payer: Self-pay | Admitting: *Deleted

## 2019-10-15 ENCOUNTER — Telehealth: Payer: Self-pay | Admitting: *Deleted

## 2019-10-15 NOTE — Telephone Encounter (Signed)
Oncology Nurse Navigator Documentation  Oncology Nurse Navigator Flowsheets 10/15/2019  Abnormal Finding Date -  Confirmed Diagnosis Date -  Diagnosis Status -  Expected Surgery Date -  Surgery Actual Start Date: -  Navigator Location [facility]  Navigator Encounter Type Telephone/I received a message that Ms. Preslar would like me to call her.  I called but was unable to reach her.  I did leave vm message for her to call me with my name and phone number.   Telephone Outgoing Call  Treatment Initiated Date -  Barriers/Navigation Needs Education  Education Other  Interventions Education  Acuity Level 2-Minimal Needs (1-2 Barriers Identified)  Coordination of Care -  Education Method Verbal  Time Spent with Patient 15

## 2019-10-15 NOTE — Telephone Encounter (Signed)
Oncology Nurse Navigator Documentation  Oncology Nurse Navigator Flowsheets 10/15/2019  Abnormal Finding Date -  Confirmed Diagnosis Date -  Diagnosis Status -  Expected Surgery Date -  Surgery Actual Start Date: -  Navigator Location [facility]  Navigator Encounter Type Telephone/I received a call from Ms. Lufkin.  We discussed her thoughts on treatment.  She was emotional on the phone and I listened to her issues.  She feels alone and brother died last year who was her support person.  I sent her some information several weeks ago that has support groups and resources for her to reach out to and noticed that CSW has also spoken with patient.  I asked that she looked at the support groups to get connected with others about DX.  I also contacted CSW to update them on our phone call.  Patient is seeing Dr. Roxan Hockey next week and I asked that she call me with an update on her plan of care.    Telephone Incoming Call  Treatment Initiated Date -  Barriers/Navigation Needs Education  Education Other  Interventions Education  Acuity Level 2-Minimal Needs (1-2 Barriers Identified)  Coordination of Care -  Education Method Verbal  Time Spent with Patient 45

## 2019-10-17 ENCOUNTER — Encounter: Payer: Self-pay | Admitting: General Practice

## 2019-10-17 DIAGNOSIS — C3490 Malignant neoplasm of unspecified part of unspecified bronchus or lung: Secondary | ICD-10-CM | POA: Diagnosis not present

## 2019-10-17 DIAGNOSIS — F4329 Adjustment disorder with other symptoms: Secondary | ICD-10-CM | POA: Diagnosis not present

## 2019-10-17 NOTE — Progress Notes (Signed)
[facility] CSW Progress Notes  Call to patient to check on needs/resources at request of nurse navigator Norton Blizzard.  Unable to reach patient, left VM w my contact information and requested a call back.    Edwyna Shell, LCSW Clinical Social Worker Phone:  [phone]

## 2019-10-20 ENCOUNTER — Telehealth: Payer: Self-pay | Admitting: Internal Medicine

## 2019-10-20 ENCOUNTER — Telehealth: Payer: Self-pay | Admitting: *Deleted

## 2019-10-20 NOTE — Telephone Encounter (Signed)
Scheduled appt per 12/14 sch message - unable to reach pt . Left message with appt date and time

## 2019-10-20 NOTE — Telephone Encounter (Signed)
Oncology Nurse Navigator Documentation  Oncology Nurse Navigator Flowsheets 10/20/2019  Abnormal Finding Date -  Confirmed Diagnosis Date -  Diagnosis Status -  Expected Surgery Date -  Surgery Actual Start Date: -  Navigator Location [facility]  Navigator Encounter Type Telephone/patient called and she had questions about her upcoming appt.  I updated and clarified. She was thankful for the help.   Telephone Incoming Call  Treatment Initiated Date -  Barriers/Navigation Needs Education  Education Other  Interventions Education  Acuity Level 2-Minimal Needs (1-2 Barriers Identified)  Coordination of Care -  Education Method Verbal  Time Spent with Patient 15

## 2019-10-21 ENCOUNTER — Ambulatory Visit: Payer: PPO | Admitting: Thoracic Surgery (Cardiothoracic Vascular Surgery)

## 2019-10-21 ENCOUNTER — Telehealth: Payer: Self-pay | Admitting: *Deleted

## 2019-10-21 NOTE — Telephone Encounter (Signed)
Per scheduler message patient requested a call to address a few questions.  See that Thoracic Navigator also reached out to patient yesterday.  Called patient and left message to return call if she still had any questions.

## 2019-10-22 ENCOUNTER — Encounter: Payer: Self-pay | Admitting: Internal Medicine

## 2019-10-22 ENCOUNTER — Other Ambulatory Visit: Payer: Self-pay

## 2019-10-22 ENCOUNTER — Inpatient Hospital Stay: Payer: PPO | Attending: Internal Medicine | Admitting: Physician Assistant

## 2019-10-22 ENCOUNTER — Encounter: Payer: Self-pay | Admitting: Physician Assistant

## 2019-10-22 ENCOUNTER — Other Ambulatory Visit: Payer: Self-pay | Admitting: Internal Medicine

## 2019-10-22 VITALS — BP 157/97 | HR 103 | Temp 98.3°F | Resp 20 | Ht 65.0 in | Wt 185.5 lb

## 2019-10-22 DIAGNOSIS — J449 Chronic obstructive pulmonary disease, unspecified: Secondary | ICD-10-CM | POA: Diagnosis not present

## 2019-10-22 DIAGNOSIS — C3432 Malignant neoplasm of lower lobe, left bronchus or lung: Secondary | ICD-10-CM | POA: Diagnosis not present

## 2019-10-22 DIAGNOSIS — E785 Hyperlipidemia, unspecified: Secondary | ICD-10-CM | POA: Diagnosis not present

## 2019-10-22 DIAGNOSIS — Z7189 Other specified counseling: Secondary | ICD-10-CM | POA: Diagnosis not present

## 2019-10-22 DIAGNOSIS — G629 Polyneuropathy, unspecified: Secondary | ICD-10-CM | POA: Insufficient documentation

## 2019-10-22 DIAGNOSIS — Z5111 Encounter for antineoplastic chemotherapy: Secondary | ICD-10-CM | POA: Diagnosis not present

## 2019-10-22 DIAGNOSIS — Z79899 Other long term (current) drug therapy: Secondary | ICD-10-CM | POA: Diagnosis not present

## 2019-10-22 DIAGNOSIS — E039 Hypothyroidism, unspecified: Secondary | ICD-10-CM | POA: Insufficient documentation

## 2019-10-22 DIAGNOSIS — F419 Anxiety disorder, unspecified: Secondary | ICD-10-CM | POA: Diagnosis not present

## 2019-10-22 DIAGNOSIS — F329 Major depressive disorder, single episode, unspecified: Secondary | ICD-10-CM | POA: Diagnosis not present

## 2019-10-22 DIAGNOSIS — E7800 Pure hypercholesterolemia, unspecified: Secondary | ICD-10-CM | POA: Insufficient documentation

## 2019-10-22 DIAGNOSIS — Z791 Long term (current) use of non-steroidal anti-inflammatories (NSAID): Secondary | ICD-10-CM | POA: Insufficient documentation

## 2019-10-22 DIAGNOSIS — C3492 Malignant neoplasm of unspecified part of left bronchus or lung: Secondary | ICD-10-CM

## 2019-10-22 DIAGNOSIS — K219 Gastro-esophageal reflux disease without esophagitis: Secondary | ICD-10-CM | POA: Diagnosis not present

## 2019-10-22 MED ORDER — PROCHLORPERAZINE MALEATE 10 MG PO TABS: 10.0000 mg | ORAL_TABLET | Freq: Four times a day (QID) | ORAL | 2 refills | Status: DC | PRN
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
## 2019-10-22 NOTE — Progress Notes (Signed)
START ON PATHWAY REGIMEN - Small Cell Lung     A cycle is every 21 days:     Etoposide      Cisplatin   **Always confirm dose/schedule in your pharmacy ordering system**  Patient Characteristics: Postoperative (Pathologic Staging), Adjuvant Therapy Therapeutic Status: Postoperative (Pathologic Staging) AJCC T Category: pT1c AJCC N Category: pN0 AJCC M Category: cM0 AJCC 8 Stage Grouping: IA3 Intent of Therapy: Curative Intent, Discussed with Patient

## 2019-10-22 NOTE — Patient Instructions (Signed)
-  There are two main categories of lung cancer, they are named based on the size of the cancer cell. One is called Non-Small cell lung cancer. The other type is Small Cell Lung Cancer -The sample (biopsy) that they took of your tumor was consistent with Small Cell Lung Cancer. (as well as the other one that was non-small cell lung cancer, a subtype called adenocarcinoma) -We covered a lot of important information at your appointment today regarding what the treatment plan is moving forward. Here are the the main points that were discussed at your office visit with [place] today:  -The treatment that you will receive consists of Two chemotherapy drugs, called Cisplatin/Carboplatin and Etoposide.  -We are planning on starting your treatment next week on _/_/_ but before your start your treatment, I would like you to attend a Chemotherapy Education Class. This involves having you sit down with one of our nurse educators. She will discuss with your one-on-one more details about your treatment as well as general information about resources here at the [facility] treatment will be given for three consecutive days every 3 weeks.  -We will check your labs once a week just to make sure that important components of your blood are in an acceptable range. We would do these three days in a row every 3 weeks for a total of 4 times.  -We will get a CT scan after you complete treatment and will continue to monitor you closely on subsequent imaging.   Medications:  -I have sent a few important medication prescriptions to your pharmacy.  -Compazine was sent to your pharmacy. This medication is for nausea. You may take this every 6 hours as needed if you feel nausous. .   Side Effects:  -The adverse effect of this treatment including but not limited to alopecia (losing your hair), myelosuppression (drops in the blood counts), nausea and vomiting, peripheral neuropathy (numbness and tingling in the hands and feet),  hearing deficit, liver or renal dysfunction.   Follow up:  -We will see you back for a follow up visit in __ weeks.

## 2019-10-23 ENCOUNTER — Other Ambulatory Visit: Payer: Self-pay | Admitting: *Deleted

## 2019-10-23 ENCOUNTER — Other Ambulatory Visit: Payer: Self-pay | Admitting: Thoracic Surgery (Cardiothoracic Vascular Surgery)

## 2019-10-23 ENCOUNTER — Ambulatory Visit (INDEPENDENT_AMBULATORY_CARE_PROVIDER_SITE_OTHER): Payer: PPO | Admitting: Thoracic Surgery (Cardiothoracic Vascular Surgery)

## 2019-10-23 ENCOUNTER — Ambulatory Visit
Admission: RE | Admit: 2019-10-23 | Discharge: 2019-10-23 | Disposition: A | Discharge status: Home / Self Care | Payer: PPO | Source: Ambulatory Visit | Attending: Thoracic Surgery (Cardiothoracic Vascular Surgery) | Admitting: Thoracic Surgery (Cardiothoracic Vascular Surgery)

## 2019-10-23 VITALS — BP 160/100 | HR 99 | Temp 97.9°F | Resp 20 | Ht 65.0 in | Wt 184.0 lb

## 2019-10-23 DIAGNOSIS — C3492 Malignant neoplasm of unspecified part of left bronchus or lung: Secondary | ICD-10-CM

## 2019-10-23 DIAGNOSIS — R918 Other nonspecific abnormal finding of lung field: Secondary | ICD-10-CM | POA: Diagnosis not present

## 2019-10-23 DIAGNOSIS — C3432 Malignant neoplasm of lower lobe, left bronchus or lung: Secondary | ICD-10-CM

## 2019-10-23 DIAGNOSIS — R911 Solitary pulmonary nodule: Secondary | ICD-10-CM

## 2019-10-23 DIAGNOSIS — Z09 Encounter for follow-up examination after completed treatment for conditions other than malignant neoplasm: Secondary | ICD-10-CM
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
## 2019-10-27 ENCOUNTER — Encounter: Payer: Self-pay | Admitting: *Deleted

## 2019-10-27 ENCOUNTER — Telehealth: Payer: Self-pay | Admitting: Hematology & Oncology

## 2019-10-27 ENCOUNTER — Telehealth: Payer: Self-pay | Admitting: *Deleted

## 2019-10-27 NOTE — Progress Notes (Signed)
I called and spoke with Andrea Kline.  I explained why she was having treatment at [facility] next week.  She verbalized understanding but needs transportation on 12/29 and 12/30.  I emailed transportation services on needs.

## 2019-10-27 NOTE — Progress Notes (Signed)
Oncology Nurse Navigator Documentation  Oncology Nurse Navigator Flowsheets 10/27/2019  Abnormal Finding Date 07/07/2019  Confirmed Diagnosis Date 08/21/2019  Diagnosis Status -  Expected Surgery Date -  Surgery Actual Start Date: -  Navigator Follow Up Date: 11/03/2019  Navigator Follow Up Reason: Adjuvant Chemo  Navigator Location [facility]  Navigator Encounter Type Other:  Telephone -  Treatment Initiated Date -  Treatment Phase Pre-Tx/Tx Discussion  Barriers/Navigation Needs Coordination of Care/I followed up on Andrea Kline's schedule and she is not set up for her first treatment yet. I contacted scheduling to schedule.   Education -  Interventions Coordination of Care  Acuity Level 2-Minimal Needs (1-2 Barriers Identified)  Coordination of Care Other  Education Method -  Time Spent with Patient 30

## 2019-10-27 NOTE — Telephone Encounter (Signed)
Oncology Nurse Navigator Documentation  Oncology Nurse Navigator Flowsheets 10/27/2019  Abnormal Finding Date -  Confirmed Diagnosis Date -  Diagnosis Status -  Expected Surgery Date -  Surgery Actual Start Date: -  Navigator Follow Up Date: -  Navigator Follow Up Reason: -  Navigator Location [facility]  Navigator Encounter Type Telephone/I called Andrea Kline to update on her schedule.  She has treatment in [facility] next week and I wanted to make sure she was aware.  I was unable to reach or leave vm message.   Telephone Outgoing Call  Treatment Initiated Date -  Treatment Phase Pre-Tx/Tx Discussion  Barriers/Navigation Needs Coordination of Care;Education  Education Other  Interventions Coordination of Care;Education  Acuity Level 2-Minimal Needs (1-2 Barriers Identified)  Coordination of Care Other  Education Method -  Time Spent with Patient 15

## 2019-10-27 NOTE — Telephone Encounter (Signed)
lmom to inform patient of lab/infusion appt 12/28 at 0800 per 12/21 staff msg

## 2019-10-28 ENCOUNTER — Inpatient Hospital Stay: Payer: PPO

## 2019-10-28 ENCOUNTER — Other Ambulatory Visit: Payer: Self-pay

## 2019-10-30 ENCOUNTER — Encounter: Payer: Self-pay | Admitting: *Deleted

## 2019-11-03 ENCOUNTER — Inpatient Hospital Stay: Payer: PPO

## 2019-11-03 ENCOUNTER — Other Ambulatory Visit: Payer: Self-pay

## 2019-11-03 ENCOUNTER — Other Ambulatory Visit: Payer: Self-pay | Admitting: Family

## 2019-11-03 VITALS — BP 145/91 | HR 88 | Temp 97.7°F | Resp 17

## 2019-11-03 DIAGNOSIS — T8090XA Unspecified complication following infusion and therapeutic injection, initial encounter: Secondary | ICD-10-CM

## 2019-11-03 DIAGNOSIS — Z5111 Encounter for antineoplastic chemotherapy: Secondary | ICD-10-CM | POA: Diagnosis not present

## 2019-11-03 DIAGNOSIS — C3492 Malignant neoplasm of unspecified part of left bronchus or lung: Secondary | ICD-10-CM

## 2019-11-03 LAB — CMP (CANCER CENTER ONLY)
ALT: 11 U/L (ref 0–44)
AST: 13 U/L — ABNORMAL LOW (ref 15–41)
Albumin: 4.4 g/dL (ref 3.5–5.0)
Alkaline Phosphatase: 138 U/L — ABNORMAL HIGH (ref 38–126)
Anion gap: 8 (ref 5–15)
BUN: 14 mg/dL (ref 8–23)
CO2: 28 mmol/L (ref 22–32)
Calcium: 9.9 mg/dL (ref 8.9–10.3)
Chloride: 107 mmol/L (ref 98–111)
Creatinine: 0.93 mg/dL (ref 0.44–1.00)
GFR, Est AFR Am: >60 mL/min (ref >60)
GFR, Estimated: >60 mL/min (ref >60)
Glucose, Bld: 114 mg/dL — ABNORMAL HIGH (ref 70–99)
Potassium: 4.3 mmol/L (ref 3.5–5.1)
Sodium: 143 mmol/L (ref 135–145)
Total Bilirubin: 0.4 mg/dL (ref 0.3–1.2)
Total Protein: 7.1 g/dL (ref 6.5–8.1)

## 2019-11-03 LAB — MAGNESIUM: Magnesium: 2.1 mg/dL (ref 1.7–2.4)

## 2019-11-03 LAB — CBC WITH DIFFERENTIAL (CANCER CENTER ONLY)
Abs Immature Granulocytes: 0.02 10*3/uL (ref 0.00–0.07)
Basophils Absolute: 0 10*3/uL (ref 0.0–0.1)
Basophils Relative: 1 %
Eosinophils Absolute: 0 10*3/uL (ref 0.0–0.5)
Eosinophils Relative: 0 %
HCT: 42.4 % (ref 36.0–46.0)
Hemoglobin: 14 g/dL (ref 12.0–15.0)
Immature Granulocytes: 0 %
Lymphocytes Relative: 34 %
Lymphs Abs: 2.6 10*3/uL (ref 0.7–4.0)
MCH: 31.7 pg (ref 26.0–34.0)
MCHC: 33 g/dL (ref 30.0–36.0)
MCV: 96.1 fL (ref 80.0–100.0)
Monocytes Absolute: 0.5 10*3/uL (ref 0.1–1.0)
Monocytes Relative: 7 %
Neutro Abs: 4.5 10*3/uL (ref 1.7–7.7)
Neutrophils Relative %: 58 %
Platelet Count: 281 10*3/uL (ref 150–400)
RBC: 4.41 MIL/uL (ref 3.87–5.11)
RDW: 12.7 % (ref 11.5–15.5)
WBC Count: 7.6 10*3/uL (ref 4.0–10.5)
nRBC: 0 % (ref 0.0–0.2)

## 2019-11-03 MED ORDER — DEXAMETHASONE SODIUM PHOSPHATE 10 MG/ML IJ SOLN
INTRAMUSCULAR | Status: AC
  Filled 2019-11-03: qty 1

## 2019-11-03 MED ORDER — SODIUM CHLORIDE 0.9 % IV SOLN
150.0000 mg | Freq: Once | INTRAVENOUS | Status: AC
  Administered 2019-11-03: 150 mg via INTRAVENOUS
  Filled 2019-11-03: qty 5

## 2019-11-03 MED ORDER — PALONOSETRON HCL INJECTION 0.25 MG/5ML
0.2500 mg | Freq: Once | INTRAVENOUS | Status: AC
  Administered 2019-11-03: 0.25 mg via INTRAVENOUS

## 2019-11-03 MED ORDER — FAMOTIDINE IN NACL 20-0.9 MG/50ML-% IV SOLN: 20.0000 mg | Freq: Once | INTRAVENOUS | Status: DC

## 2019-11-03 MED ORDER — DIPHENHYDRAMINE HCL 50 MG/ML IJ SOLN
50.0000 mg | Freq: Once | INTRAMUSCULAR | Status: AC
  Administered 2019-11-03: 50 mg via INTRAVENOUS

## 2019-11-03 MED ORDER — DIPHENHYDRAMINE HCL 50 MG/ML IJ SOLN: 50.0000 mg | Freq: Once | INTRAMUSCULAR | Status: DC

## 2019-11-03 MED ORDER — METHYLPREDNISOLONE SODIUM SUCC 125 MG IJ SOLR: 125.0000 mg | Freq: Once | INTRAMUSCULAR | Status: DC

## 2019-11-03 MED ORDER — FAMOTIDINE IN NACL 20-0.9 MG/50ML-% IV SOLN
20.0000 mg | Freq: Once | INTRAVENOUS | Status: AC
  Administered 2019-11-03: 20 mg via INTRAVENOUS

## 2019-11-03 MED ORDER — METHYLPREDNISOLONE SODIUM SUCC 125 MG IJ SOLR
125.0000 mg | Freq: Once | INTRAMUSCULAR | Status: AC
  Administered 2019-11-03: 125 mg via INTRAVENOUS

## 2019-11-03 MED ORDER — SODIUM CHLORIDE 0.9 % IV SOLN
100.0000 mg/m2 | Freq: Once | INTRAVENOUS | Status: AC
  Administered 2019-11-03: 200 mg via INTRAVENOUS
  Filled 2019-11-03: qty 10

## 2019-11-03 MED ORDER — SODIUM CHLORIDE 0.9 % IV SOLN
76.5000 mg/m2 | Freq: Once | INTRAVENOUS | Status: AC
  Administered 2019-11-03: 150 mg via INTRAVENOUS
  Filled 2019-11-03: qty 50

## 2019-11-03 MED ORDER — DEXAMETHASONE SODIUM PHOSPHATE 10 MG/ML IJ SOLN
10.0000 mg | Freq: Once | INTRAMUSCULAR | Status: AC
  Administered 2019-11-03: 10 mg via INTRAVENOUS

## 2019-11-03 MED ORDER — SODIUM CHLORIDE 0.9 % IV SOLN
Freq: Once | INTRAVENOUS | Status: AC
  Filled 2019-11-03: qty 250

## 2019-11-03 MED ORDER — POTASSIUM CHLORIDE 2 MEQ/ML IV SOLN
Freq: Once | INTRAVENOUS | Status: AC
  Filled 2019-11-03: qty 10

## 2019-11-03 NOTE — Patient Instructions (Signed)
[facility] Discharge Instructions for Patients Receiving Chemotherapy  Today you received the following chemotherapy agents Etoposide and Cisplatin.  To help prevent nausea and vomiting after your treatment, we encourage you to take your nausea medication as prescribed by MD. **NO ZOFRAN FOR 3 DAYS AFTER TREATMENT**   If you develop nausea and vomiting that is not controlled by your nausea medication, call the clinic.   BELOW ARE SYMPTOMS THAT SHOULD BE REPORTED IMMEDIATELY:  *FEVER GREATER THAN 100.5 F  *CHILLS WITH OR WITHOUT FEVER  NAUSEA AND VOMITING THAT IS NOT CONTROLLED WITH YOUR NAUSEA MEDICATION  *UNUSUAL SHORTNESS OF BREATH  *UNUSUAL BRUISING OR BLEEDING  TENDERNESS IN MOUTH AND THROAT WITH OR WITHOUT PRESENCE OF ULCERS  *URINARY PROBLEMS  *BOWEL PROBLEMS  UNUSUAL RASH Items with * indicate a potential emergency and should be followed up as soon as possible.  Feel free to call the clinic should you have any questions or concerns. The clinic phone number is [phone].  Please show the [facility] at check-in to the Emergency Department and triage nurse.  Cisplatin injection What is this medicine? CISPLATIN (SIS pla tin) is a chemotherapy drug. It targets fast dividing cells, like cancer cells, and causes these cells to die. This medicine is used to treat many types of cancer like bladder, ovarian, and testicular cancers. This medicine may be used for other purposes; ask your health care provider or pharmacist if you have questions. COMMON BRAND NAME(S): Platinol, Platinol -AQ What should I tell my health care provider before I take this medicine? They need to know if you have any of these conditions:  blood disorders  hearing problems  kidney disease  recent or ongoing radiation therapy  an unusual or allergic reaction to cisplatin, carboplatin, other chemotherapy, other medicines, foods, dyes, or preservatives  pregnant or trying  to get pregnant  breast-feeding How should I use this medicine? This drug is given as an infusion into a vein. It is administered in a hospital or clinic by a specially trained health care professional. Talk to your pediatrician regarding the use of this medicine in children. Special care may be needed. Overdosage: If you think you have taken too much of this medicine contact a poison control center or emergency room at once. NOTE: This medicine is only for you. Do not share this medicine with others. What if I miss a dose? It is important not to miss a dose. Call your doctor or health care professional if you are unable to keep an appointment. What may interact with this medicine?  dofetilide  foscarnet  medicines for seizures  medicines to increase blood counts like filgrastim, pegfilgrastim, sargramostim  probenecid  pyridoxine used with altretamine  rituximab  some antibiotics like amikacin, gentamicin, neomycin, polymyxin B, streptomycin, tobramycin  sulfinpyrazone  vaccines  zalcitabine Talk to your doctor or health care professional before taking any of these medicines:  acetaminophen  aspirin  ibuprofen  ketoprofen  naproxen This list may not describe all possible interactions. Give your health care provider a list of all the medicines, herbs, non-prescription drugs, or dietary supplements you use. Also tell them if you smoke, drink alcohol, or use illegal drugs. Some items may interact with your medicine. What should I watch for while using this medicine? Your condition will be monitored carefully while you are receiving this medicine. You will need important blood work done while you are taking this medicine. This drug may make you feel generally unwell. This is  not uncommon, as chemotherapy can affect healthy cells as well as cancer cells. Report any side effects. Continue your course of treatment even though you feel ill unless your doctor tells you to  stop. In some cases, you may be given additional medicines to help with side effects. Follow all directions for their use. Call your doctor or health care professional for advice if you get a fever, chills or sore throat, or other symptoms of a cold or flu. Do not treat yourself. This drug decreases your body's ability to fight infections. Try to avoid being around people who are sick. This medicine may increase your risk to bruise or bleed. Call your doctor or health care professional if you notice any unusual bleeding. Be careful brushing and flossing your teeth or using a toothpick because you may get an infection or bleed more easily. If you have any dental work done, tell your dentist you are receiving this medicine. Avoid taking products that contain aspirin, acetaminophen, ibuprofen, naproxen, or ketoprofen unless instructed by your doctor. These medicines may hide a fever. Do not become pregnant while taking this medicine. Women should inform their doctor if they wish to become pregnant or think they might be pregnant. There is a potential for serious side effects to an unborn child. Talk to your health care professional or pharmacist for more information. Do not breast-feed an infant while taking this medicine. Drink fluids as directed while you are taking this medicine. This will help protect your kidneys. Call your doctor or health care professional if you get diarrhea. Do not treat yourself. What side effects may I notice from receiving this medicine? Side effects that you should report to your doctor or health care professional as soon as possible:  allergic reactions like skin rash, itching or hives, swelling of the face, lips, or tongue  signs of infection - fever or chills, cough, sore throat, pain or difficulty passing urine  signs of decreased platelets or bleeding - bruising, pinpoint red spots on the skin, black, tarry stools, nosebleeds  signs of decreased red blood cells -  unusually weak or tired, fainting spells, lightheadedness  breathing problems  changes in hearing  gout pain  low blood counts - This drug may decrease the number of white blood cells, red blood cells and platelets. You may be at increased risk for infections and bleeding.  nausea and vomiting  pain, swelling, redness or irritation at the injection site  pain, tingling, numbness in the hands or feet  problems with balance, movement  trouble passing urine or change in the amount of urine Side effects that usually do not require medical attention (report to your doctor or health care professional if they continue or are bothersome):  changes in vision  loss of appetite  metallic taste in the mouth or changes in taste This list may not describe all possible side effects. Call your doctor for medical advice about side effects. You may report side effects to FDA at 1-800-FDA-1088. Where should I keep my medicine? This drug is given in a hospital or clinic and will not be stored at home. NOTE: This sheet is a summary. It may not cover all possible information. If you have questions about this medicine, talk to your doctor, pharmacist, or health care provider.  2020 Elsevier/Gold Standard (2008-01-28 14:40:54)   Etoposide, VP-16 capsules What is this medicine? ETOPOSIDE, VP-16 (e toe POE side) is a chemotherapy drug. It is used to treat small cell lung cancer and other  cancers. This medicine may be used for other purposes; ask your health care provider or pharmacist if you have questions. COMMON BRAND NAME(S): VePesid What should I tell my health care provider before I take this medicine? They need to know if you have any of these conditions:  infection  kidney disease  liver disease  low blood counts, like low white cell, platelet, or red cell counts  an unusual or allergic reaction to etoposide, other medicines, foods, dyes, or preservatives  pregnant or trying to get  pregnant  breast-feeding How should I use this medicine? Take this medicine by mouth with a glass of water. Follow the directions on the prescription label. Do not open, crush, or chew the capsules. It is advisable to wear gloves when handling this medicine. Take your medicine at regular intervals. Do not take it more often than directed. Do not stop taking except on your doctor's advice. Talk to your pediatrician regarding the use of this medicine in children. Special care may be needed. Overdosage: If you think you have taken too much of this medicine contact a poison control center or emergency room at once. NOTE: This medicine is only for you. Do not share this medicine with others. What if I miss a dose? If you miss a dose, take it as soon as you can. If it is almost time for your next dose, take only that dose. Do not take double or extra doses. What may interact with this medicine?  aspirin  certain medications for seizures like carbamazepine, phenobarbital, phenytoin, valproic acid  cyclosporine  levamisole  valproic acid  warfarin This list may not describe all possible interactions. Give your health care provider a list of all the medicines, herbs, non-prescription drugs, or dietary supplements you use. Also tell them if you smoke, drink alcohol, or use illegal drugs. Some items may interact with your medicine. What should I watch for while using this medicine? Visit your doctor for checks on your progress. This drug may make you feel generally unwell. This is not uncommon, as chemotherapy can affect healthy cells as well as cancer cells. Report any side effects. Continue your course of treatment even though you feel ill unless your doctor tells you to stop. In some cases, you may be given additional medicines to help with side effects. Follow all directions for their use. Call your doctor or health care professional for advice if you get a fever, chills or sore throat, or other  symptoms of a cold or flu. Do not treat yourself. This drug decreases your body's ability to fight infections. Try to avoid being around people who are sick. This medicine may increase your risk to bruise or bleed. Call your doctor or health care professional if you notice any unusual bleeding. Talk to your doctor about your risk of cancer. You may be more at risk for certain types of cancers if you take this medicine. Do not become pregnant while taking this medicine or for at least 6 months after stopping it. Women should inform their doctor if they wish to become pregnant or think they might be pregnant. Women of child-bearing potential will need to have a negative pregnancy test before starting this medicine. There is a potential for serious side effects to an unborn child. Talk to your health care professional or pharmacist for more information. Do not breast-feed an infant while taking this medicine. Men must use a latex condom during sexual contact with a woman while taking this medicine and for  at least 4 months after stopping it. A latex condom is needed even if you have had a vasectomy. Contact your doctor right away if your partner becomes pregnant. Do not donate sperm while taking this medicine and for 4 months after you stop taking this medicine. Men should inform their doctors if they wish to father a child. This medicine may lower sperm counts. What side effects may I notice from receiving this medicine? Side effects that you should report to your doctor or health care professional as soon as possible:  allergic reactions like skin rash, itching or hives, swelling of the face, lips, or tongue  low blood counts - this medicine may decrease the number of white blood cells, red blood cells and platelets. You may be at increased risk for infections and bleeding.  signs of infection - fever or chills, cough, sore throat, pain or difficulty passing urine  signs of decreased platelets or  bleeding - bruising, pinpoint red spots on the skin, black, tarry stools, blood in the urine  signs of decreased red blood cells - unusually weak or tired, fainting spells, lightheadedness  breathing problems  changes in vision  mouth or throat sores or ulcers  pain, tingling, numbness in the hands or feet  redness, blistering, peeling or loosening of the skin, including inside the mouth  seizures  vomiting Side effects that usually do not require medical attention (report to your doctor or health care professional if they continue or are bothersome):  change in taste  diarrhea  hair loss  nausea  stomach pain This list may not describe all possible side effects. Call your doctor for medical advice about side effects. You may report side effects to FDA at 1-800-FDA-1088. Where should I keep my medicine? Keep out of the reach of children. Store in a refrigerator between 2 and 8 degrees C (36 and 46 degrees F). Do not freeze. Throw away any unused medicine after the expiration date. NOTE: This sheet is a summary. It may not cover all possible information. If you have questions about this medicine, talk to your doctor, pharmacist, or health care provider.  2020 Elsevier/Gold Standard (2015-10-15 11:49:52)

## 2019-11-03 NOTE — Progress Notes (Signed)
1455: Pt started to cough and have redness noted on her neck and chest. Pt stated she felt a heaviness in her chest and that she feels like "I am reacting to my advair" Etoposide infusion stopped and Judson Roch, NP at bedside. Pt medicated per orders; refer to [facility]. Vitals as charted.  Pt remained awake and alert. Skin warm and dry. 1500: Pt placed on oxygen via nasal cannula. Vitals stable; refer to flowsheets.   1510: Pt sitting up in chair in no apparent distress. Pt aware of plan of care. Dr. [name] aware of pt reaction. Pt respirations equal and unlabored. Skin warm and dry. Pt denies any symptoms at this time stating she feels fine.   1539: Restarted etoposide. Pt aware and agreeable to plan.

## 2019-11-03 NOTE — Progress Notes (Signed)
Ok to run hydration fluids with cisplatin per MD.

## 2019-11-04 ENCOUNTER — Inpatient Hospital Stay: Payer: PPO

## 2019-11-04 VITALS — BP 165/91 | HR 100 | Temp 98.6°F | Resp 19

## 2019-11-04 DIAGNOSIS — Z5111 Encounter for antineoplastic chemotherapy: Secondary | ICD-10-CM | POA: Diagnosis not present

## 2019-11-04 DIAGNOSIS — C3492 Malignant neoplasm of unspecified part of left bronchus or lung: Secondary | ICD-10-CM

## 2019-11-04 MED ORDER — DIPHENHYDRAMINE HCL 50 MG/ML IJ SOLN
50.0000 mg | Freq: Once | INTRAMUSCULAR | Status: AC
  Administered 2019-11-04: 50 mg via INTRAVENOUS

## 2019-11-04 MED ORDER — DEXAMETHASONE SODIUM PHOSPHATE 10 MG/ML IJ SOLN: 10.0000 mg | Freq: Once | INTRAMUSCULAR | Status: DC

## 2019-11-04 MED ORDER — METHYLPREDNISOLONE SODIUM SUCC 125 MG IJ SOLR
INTRAMUSCULAR | Status: AC
  Filled 2019-11-04: qty 2

## 2019-11-04 MED ORDER — SODIUM CHLORIDE 0.9 % IV SOLN
100.0000 mg/m2 | Freq: Once | INTRAVENOUS | Status: AC
  Administered 2019-11-04: 200 mg via INTRAVENOUS
  Filled 2019-11-04: qty 10

## 2019-11-04 MED ORDER — FAMOTIDINE IN NACL 20-0.9 MG/50ML-% IV SOLN
INTRAVENOUS | Status: AC
  Filled 2019-11-04: qty 50

## 2019-11-04 MED ORDER — METHYLPREDNISOLONE SODIUM SUCC 125 MG IJ SOLR
125.0000 mg | Freq: Once | INTRAMUSCULAR | Status: AC
  Administered 2019-11-04: 125 mg via INTRAVENOUS

## 2019-11-04 MED ORDER — SODIUM CHLORIDE 0.9 % IV SOLN
Freq: Once | INTRAVENOUS | Status: AC
  Filled 2019-11-04: qty 250

## 2019-11-04 MED ORDER — FAMOTIDINE IN NACL 20-0.9 MG/50ML-% IV SOLN
20.0000 mg | Freq: Once | INTRAVENOUS | Status: AC
  Administered 2019-11-04: 20 mg via INTRAVENOUS

## 2019-11-04 MED ORDER — DIPHENHYDRAMINE HCL 50 MG/ML IJ SOLN
INTRAMUSCULAR | Status: AC
  Filled 2019-11-04: qty 1

## 2019-11-04 NOTE — Patient Instructions (Signed)
[facility] Discharge Instructions for Patients Receiving Chemotherapy  Today you received the following chemotherapy agents Etoposide.  To help prevent nausea and vomiting after your treatment, we encourage you to take your nausea medication as prescribed by MD. **NO ZOFRAN FOR 3 DAYS AFTER TREATMENT**   If you develop nausea and vomiting that is not controlled by your nausea medication, call the clinic.   BELOW ARE SYMPTOMS THAT SHOULD BE REPORTED IMMEDIATELY:  *FEVER GREATER THAN 100.5 F  *CHILLS WITH OR WITHOUT FEVER  NAUSEA AND VOMITING THAT IS NOT CONTROLLED WITH YOUR NAUSEA MEDICATION  *UNUSUAL SHORTNESS OF BREATH  *UNUSUAL BRUISING OR BLEEDING  TENDERNESS IN MOUTH AND THROAT WITH OR WITHOUT PRESENCE OF ULCERS  *URINARY PROBLEMS  *BOWEL PROBLEMS  UNUSUAL RASH Items with * indicate a potential emergency and should be followed up as soon as possible.  Feel free to call the clinic should you have any questions or concerns. The clinic phone number is [phone].  Please show the [facility] at check-in to the Emergency Department and triage nurse.

## 2019-11-04 NOTE — Progress Notes (Signed)
Patient reacted with first dose of etoposide.  Deleting dexamethasone from premeds and adding methylprednisolone 125 mg, diphenhydramine 50 mg, and famotidine 20 mg per Dr. Worthy Flank instructions for today's and future doses.Marland Kitchen

## 2019-11-04 NOTE — Progress Notes (Signed)
Pt completed day 2 VP-16. Premeds given per MD.  No reaction. Transportation called for pt to get home.

## 2019-11-05 ENCOUNTER — Encounter: Payer: Self-pay | Admitting: *Deleted

## 2019-11-05 ENCOUNTER — Inpatient Hospital Stay: Payer: PPO

## 2019-11-05 ENCOUNTER — Telehealth: Payer: Self-pay | Admitting: Internal Medicine

## 2019-11-05 ENCOUNTER — Other Ambulatory Visit: Payer: Self-pay

## 2019-11-05 VITALS — BP 142/75 | HR 97 | Temp 97.3°F | Resp 18

## 2019-11-05 DIAGNOSIS — C3492 Malignant neoplasm of unspecified part of left bronchus or lung: Secondary | ICD-10-CM

## 2019-11-05 DIAGNOSIS — Z5111 Encounter for antineoplastic chemotherapy: Secondary | ICD-10-CM | POA: Diagnosis not present

## 2019-11-05 MED ORDER — DIPHENHYDRAMINE HCL 50 MG/ML IJ SOLN
INTRAMUSCULAR | Status: AC
  Filled 2019-11-05: qty 1

## 2019-11-05 MED ORDER — SODIUM CHLORIDE 0.9 % IV SOLN
100.0000 mg/m2 | Freq: Once | INTRAVENOUS | Status: AC
  Administered 2019-11-05: 200 mg via INTRAVENOUS
  Filled 2019-11-05: qty 10

## 2019-11-05 MED ORDER — FAMOTIDINE IN NACL 20-0.9 MG/50ML-% IV SOLN
INTRAVENOUS | Status: AC
  Filled 2019-11-05: qty 50

## 2019-11-05 MED ORDER — SODIUM CHLORIDE 0.9 % IV SOLN
Freq: Once | INTRAVENOUS | Status: AC
  Filled 2019-11-05: qty 250

## 2019-11-05 MED ORDER — DIPHENHYDRAMINE HCL 25 MG PO CAPS
ORAL_CAPSULE | ORAL | Status: AC
  Filled 2019-11-05: qty 1

## 2019-11-05 MED ORDER — DIPHENHYDRAMINE HCL 50 MG/ML IJ SOLN
25.0000 mg | Freq: Once | INTRAMUSCULAR | Status: AC
  Administered 2019-11-05: 25 mg via INTRAVENOUS

## 2019-11-05 MED ORDER — METHYLPREDNISOLONE SODIUM SUCC 125 MG IJ SOLR
125.0000 mg | Freq: Once | INTRAMUSCULAR | Status: AC
  Administered 2019-11-05: 09:00:00 125 mg via INTRAVENOUS

## 2019-11-05 MED ORDER — METHYLPREDNISOLONE SODIUM SUCC 125 MG IJ SOLR
INTRAMUSCULAR | Status: AC
  Filled 2019-11-05: qty 2

## 2019-11-05 MED ORDER — FAMOTIDINE IN NACL 20-0.9 MG/50ML-% IV SOLN
20.0000 mg | Freq: Once | INTRAVENOUS | Status: AC
  Administered 2019-11-05: 20 mg via INTRAVENOUS

## 2019-11-05 NOTE — Patient Instructions (Signed)
[facility] Discharge Instructions for Patients Receiving Chemotherapy  Today you received the following chemotherapy agents Etoposide.  To help prevent nausea and vomiting after your treatment, we encourage you to take your nausea medication as prescribed by MD. **NO ZOFRAN FOR 3 DAYS AFTER TREATMENT**   If you develop nausea and vomiting that is not controlled by your nausea medication, call the clinic.   BELOW ARE SYMPTOMS THAT SHOULD BE REPORTED IMMEDIATELY:  *FEVER GREATER THAN 100.5 F  *CHILLS WITH OR WITHOUT FEVER  NAUSEA AND VOMITING THAT IS NOT CONTROLLED WITH YOUR NAUSEA MEDICATION  *UNUSUAL SHORTNESS OF BREATH  *UNUSUAL BRUISING OR BLEEDING  TENDERNESS IN MOUTH AND THROAT WITH OR WITHOUT PRESENCE OF ULCERS  *URINARY PROBLEMS  *BOWEL PROBLEMS  UNUSUAL RASH Items with * indicate a potential emergency and should be followed up as soon as possible.  Feel free to call the clinic should you have any questions or concerns. The clinic phone number is [phone].  Please show the [facility] at check-in to the Emergency Department and triage nurse.

## 2019-11-05 NOTE — Progress Notes (Signed)
Oncology Nurse Navigator Documentation  Oncology Nurse Navigator Flowsheets 11/05/2019  Abnormal Finding Date -  Confirmed Diagnosis Date -  Diagnosis Status -  Planned Course of Treatment -  Phase of Treatment -  Chemotherapy Pending- Reason: -  Chemotherapy Actual Start Date: -  Chemotherapy Expected End Date: -  Expected Surgery Date -  Surgery Actual Start Date: -  Navigator Follow Up Date: 11/10/2019  Navigator Follow Up Reason: Follow-up Appointment  Navigator Location [facility]  Navigator Encounter Type Other/I followed up on Ms. Andrea Kline's schedule.  She is set up with her treatment plan and schedule.    Telephone -  Treatment Initiated Date -  Treatment Phase Treatment  Barriers/Navigation Needs Coordination of Care  Education -  Interventions Coordination of Care  Acuity Level 2-Minimal Needs (1-2 Barriers Identified)  Coordination of Care Other  Education Method -  Time Spent with Patient 15

## 2019-11-05 NOTE — Telephone Encounter (Signed)
Called pt per 12/30 sch message - pt wanted me to call sister.  Called sister, no answer. Left message with appt date and time

## 2019-11-05 NOTE — Progress Notes (Signed)
Pt advised she was very tired yesterday, went to sleep and did not wake up long enough to drink fluids or eat. Reviewed with MD, VO to decrease Benadryl from 50 IV to 25IV. Notified Pharmacy, treatment planned will be updated.

## 2019-11-06 ENCOUNTER — Telehealth: Payer: Self-pay

## 2019-11-06 NOTE — Telephone Encounter (Signed)
Ms Mainor sister, Jissell Trafton called and left voice message today at [phone] stating she called yesterday and her call was not returned. I called Ms Swango at [phone] today and left a vm that I had returned her call. Ms. Levora Dredge returned my call and was wanting more information regarding her sisters appointment this Saturday 11/08/2019.  I explained this injection was to stimulate WBC growth to prevent neutropenia.  She verbalized understanding.

## 2019-11-08 ENCOUNTER — Other Ambulatory Visit: Payer: Self-pay

## 2019-11-08 ENCOUNTER — Inpatient Hospital Stay: Payer: PPO | Attending: Internal Medicine

## 2019-11-08 VITALS — BP 146/80 | HR 99 | Temp 97.7°F | Resp 20

## 2019-11-08 DIAGNOSIS — R110 Nausea: Secondary | ICD-10-CM | POA: Insufficient documentation

## 2019-11-08 DIAGNOSIS — M791 Myalgia, unspecified site: Secondary | ICD-10-CM | POA: Diagnosis not present

## 2019-11-08 DIAGNOSIS — C3432 Malignant neoplasm of lower lobe, left bronchus or lung: Secondary | ICD-10-CM | POA: Insufficient documentation

## 2019-11-08 DIAGNOSIS — I10 Essential (primary) hypertension: Secondary | ICD-10-CM | POA: Diagnosis not present

## 2019-11-08 DIAGNOSIS — J449 Chronic obstructive pulmonary disease, unspecified: Secondary | ICD-10-CM | POA: Diagnosis not present

## 2019-11-08 DIAGNOSIS — Z5189 Encounter for other specified aftercare: Secondary | ICD-10-CM | POA: Diagnosis not present

## 2019-11-08 DIAGNOSIS — Z5111 Encounter for antineoplastic chemotherapy: Secondary | ICD-10-CM | POA: Insufficient documentation

## 2019-11-08 DIAGNOSIS — R519 Headache, unspecified: Secondary | ICD-10-CM | POA: Diagnosis not present

## 2019-11-08 DIAGNOSIS — M1990 Unspecified osteoarthritis, unspecified site: Secondary | ICD-10-CM | POA: Insufficient documentation

## 2019-11-08 DIAGNOSIS — E7800 Pure hypercholesterolemia, unspecified: Secondary | ICD-10-CM | POA: Insufficient documentation

## 2019-11-08 DIAGNOSIS — F329 Major depressive disorder, single episode, unspecified: Secondary | ICD-10-CM | POA: Diagnosis not present

## 2019-11-08 DIAGNOSIS — Z79899 Other long term (current) drug therapy: Secondary | ICD-10-CM | POA: Diagnosis not present

## 2019-11-08 DIAGNOSIS — R197 Diarrhea, unspecified: Secondary | ICD-10-CM | POA: Diagnosis not present

## 2019-11-08 DIAGNOSIS — C3492 Malignant neoplasm of unspecified part of left bronchus or lung: Secondary | ICD-10-CM

## 2019-11-08 DIAGNOSIS — F419 Anxiety disorder, unspecified: Secondary | ICD-10-CM | POA: Diagnosis not present

## 2019-11-08 DIAGNOSIS — E039 Hypothyroidism, unspecified: Secondary | ICD-10-CM | POA: Insufficient documentation

## 2019-11-08 DIAGNOSIS — K219 Gastro-esophageal reflux disease without esophagitis: Secondary | ICD-10-CM | POA: Insufficient documentation

## 2019-11-08 DIAGNOSIS — Z791 Long term (current) use of non-steroidal anti-inflammatories (NSAID): Secondary | ICD-10-CM | POA: Insufficient documentation

## 2019-11-08 MED ORDER — PEGFILGRASTIM-JMDB 6 MG/0.6ML ~~LOC~~ SOSY
6.0000 mg | PREFILLED_SYRINGE | Freq: Once | SUBCUTANEOUS | Status: AC
  Administered 2019-11-08: 11:00:00 6 mg via SUBCUTANEOUS

## 2019-11-08 NOTE — Patient Instructions (Signed)

## 2019-11-10 ENCOUNTER — Telehealth: Payer: Self-pay | Admitting: Internal Medicine

## 2019-11-10 ENCOUNTER — Encounter: Payer: Self-pay | Admitting: Internal Medicine

## 2019-11-10 ENCOUNTER — Inpatient Hospital Stay (HOSPITAL_BASED_OUTPATIENT_CLINIC_OR_DEPARTMENT_OTHER): Payer: PPO | Admitting: Internal Medicine

## 2019-11-10 ENCOUNTER — Other Ambulatory Visit: Payer: Self-pay

## 2019-11-10 ENCOUNTER — Inpatient Hospital Stay: Payer: PPO

## 2019-11-10 VITALS — BP 155/88 | HR 103 | Temp 97.2°F | Resp 20 | Ht 65.0 in | Wt 181.5 lb

## 2019-11-10 DIAGNOSIS — I10 Essential (primary) hypertension: Secondary | ICD-10-CM | POA: Insufficient documentation

## 2019-11-10 DIAGNOSIS — C3432 Malignant neoplasm of lower lobe, left bronchus or lung: Secondary | ICD-10-CM | POA: Diagnosis not present

## 2019-11-10 DIAGNOSIS — C3492 Malignant neoplasm of unspecified part of left bronchus or lung: Secondary | ICD-10-CM

## 2019-11-10 DIAGNOSIS — Z5111 Encounter for antineoplastic chemotherapy: Secondary | ICD-10-CM | POA: Diagnosis not present

## 2019-11-10 LAB — CBC WITH DIFFERENTIAL (CANCER CENTER ONLY)
Abs Immature Granulocytes: 0 10*3/uL (ref 0.00–0.07)
Band Neutrophils: 2 %
Basophils Absolute: 0 10*3/uL (ref 0.0–0.1)
Basophils Relative: 0 %
Eosinophils Absolute: 0 10*3/uL (ref 0.0–0.5)
Eosinophils Relative: 0 %
HCT: 38 % (ref 36.0–46.0)
Hemoglobin: 12.5 g/dL (ref 12.0–15.0)
Lymphocytes Relative: 27 %
Lymphs Abs: 1.8 10*3/uL (ref 0.7–4.0)
MCH: 31.8 pg (ref 26.0–34.0)
MCHC: 32.9 g/dL (ref 30.0–36.0)
MCV: 96.7 fL (ref 80.0–100.0)
Monocytes Absolute: 0 10*3/uL — ABNORMAL LOW (ref 0.1–1.0)
Monocytes Relative: 0 %
Neutro Abs: 5 10*3/uL (ref 1.7–7.7)
Neutrophils Relative %: 71 %
Platelet Count: 145 10*3/uL — ABNORMAL LOW (ref 150–400)
RBC: 3.93 MIL/uL (ref 3.87–5.11)
RDW: 12.5 % (ref 11.5–15.5)
WBC Count: 6.8 10*3/uL (ref 4.0–10.5)
nRBC: 0 % (ref 0.0–0.2)

## 2019-11-10 LAB — CMP (CANCER CENTER ONLY)
ALT: 22 U/L (ref 0–44)
AST: 15 U/L (ref 15–41)
Albumin: 3.8 g/dL (ref 3.5–5.0)
Alkaline Phosphatase: 124 U/L (ref 38–126)
Anion gap: 7 (ref 5–15)
BUN: 18 mg/dL (ref 8–23)
CO2: 28 mmol/L (ref 22–32)
Calcium: 9.1 mg/dL (ref 8.9–10.3)
Chloride: 105 mmol/L (ref 98–111)
Creatinine: 0.76 mg/dL (ref 0.44–1.00)
GFR, Est AFR Am: >60 mL/min (ref >60)
GFR, Estimated: >60 mL/min (ref >60)
Glucose, Bld: 91 mg/dL (ref 70–99)
Potassium: 3.8 mmol/L (ref 3.5–5.1)
Sodium: 140 mmol/L (ref 135–145)
Total Bilirubin: 0.2 mg/dL — ABNORMAL LOW (ref 0.3–1.2)
Total Protein: 6.5 g/dL (ref 6.5–8.1)

## 2019-11-10 LAB — MAGNESIUM: Magnesium: 1.9 mg/dL (ref 1.7–2.4)
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
## 2019-11-10 NOTE — Telephone Encounter (Signed)
Scheduled appt per 1/4 los - pt is aware of appt . Gave patient AVS and calender

## 2019-11-11 ENCOUNTER — Other Ambulatory Visit: Payer: Self-pay | Admitting: Internal Medicine

## 2019-11-14 ENCOUNTER — Telehealth: Payer: Self-pay | Admitting: Physician Assistant

## 2019-11-14 ENCOUNTER — Telehealth: Payer: Self-pay | Admitting: Emergency Medicine

## 2019-11-14 ENCOUNTER — Other Ambulatory Visit: Payer: Self-pay | Admitting: Physician Assistant

## 2019-11-14 DIAGNOSIS — C3492 Malignant neoplasm of unspecified part of left bronchus or lung: Secondary | ICD-10-CM

## 2019-11-14 MED ORDER — OXYCODONE-ACETAMINOPHEN 5-325 MG PO TABS: 1.0000 | ORAL_TABLET | Freq: Three times a day (TID) | ORAL | 0 refills | Status: DC | PRN

## 2019-11-14 NOTE — Telephone Encounter (Signed)
Spoke to the patient to ask about her listed codeine allergy. It is listed that she has dizziness and nausea when she took it as a teenager. She states she is unsure and has not had it in years but would be willing to try it and that she "needs something". She was tearful on the phone.

## 2019-11-14 NOTE — Telephone Encounter (Signed)
Pt called reporting joint/bone pain after fulphilia injection on 11/11/19.  States she is taking claritin and tylenol with little relief.  Spoke with MD [name] who states he will send in a prescription for a pain medication to pt's preferred pharmacy.  Pt verbalizes understanding and is aware to call back as needed.

## 2019-11-17 ENCOUNTER — Other Ambulatory Visit: Payer: Self-pay

## 2019-11-17 ENCOUNTER — Inpatient Hospital Stay: Payer: PPO

## 2019-11-17 DIAGNOSIS — C3432 Malignant neoplasm of lower lobe, left bronchus or lung: Secondary | ICD-10-CM | POA: Diagnosis not present

## 2019-11-17 DIAGNOSIS — C3492 Malignant neoplasm of unspecified part of left bronchus or lung: Secondary | ICD-10-CM

## 2019-11-17 LAB — CMP (CANCER CENTER ONLY)
ALT: 15 U/L (ref 0–44)
AST: 14 U/L — ABNORMAL LOW (ref 15–41)
Albumin: 3.7 g/dL (ref 3.5–5.0)
Alkaline Phosphatase: 195 U/L — ABNORMAL HIGH (ref 38–126)
Anion gap: 13 (ref 5–15)
BUN: 9 mg/dL (ref 8–23)
CO2: 22 mmol/L (ref 22–32)
Calcium: 8.7 mg/dL — ABNORMAL LOW (ref 8.9–10.3)
Chloride: 108 mmol/L (ref 98–111)
Creatinine: 0.81 mg/dL (ref 0.44–1.00)
GFR, Est AFR Am: >60 mL/min (ref >60)
GFR, Estimated: >60 mL/min (ref >60)
Glucose, Bld: 116 mg/dL — ABNORMAL HIGH (ref 70–99)
Potassium: 3.6 mmol/L (ref 3.5–5.1)
Sodium: 143 mmol/L (ref 135–145)
Total Bilirubin: <0.2 mg/dL — ABNORMAL LOW (ref 0.3–1.2)
Total Protein: 6.4 g/dL — ABNORMAL LOW (ref 6.5–8.1)

## 2019-11-17 LAB — CBC WITH DIFFERENTIAL (CANCER CENTER ONLY)
Abs Immature Granulocytes: 2.2 10*3/uL — ABNORMAL HIGH (ref 0.00–0.07)
Basophils Absolute: 0 10*3/uL (ref 0.0–0.1)
Basophils Relative: 0 %
Eosinophils Absolute: 0 10*3/uL (ref 0.0–0.5)
Eosinophils Relative: 0 %
HCT: 37.8 % (ref 36.0–46.0)
Hemoglobin: 12.5 g/dL (ref 12.0–15.0)
Immature Granulocytes: 12 %
Lymphocytes Relative: 13 %
Lymphs Abs: 2.4 10*3/uL (ref 0.7–4.0)
MCH: 32.4 pg (ref 26.0–34.0)
MCHC: 33.1 g/dL (ref 30.0–36.0)
MCV: 97.9 fL (ref 80.0–100.0)
Monocytes Absolute: 2.9 10*3/uL — ABNORMAL HIGH (ref 0.1–1.0)
Monocytes Relative: 15 %
Neutro Abs: 11.6 10*3/uL — ABNORMAL HIGH (ref 1.7–7.7)
Neutrophils Relative %: 60 %
Platelet Count: 116 10*3/uL — ABNORMAL LOW (ref 150–400)
RBC: 3.86 MIL/uL — ABNORMAL LOW (ref 3.87–5.11)
RDW: 13.1 % (ref 11.5–15.5)
WBC Count: 19.1 10*3/uL — ABNORMAL HIGH (ref 4.0–10.5)
nRBC: 0 % (ref 0.0–0.2)

## 2019-11-17 LAB — MAGNESIUM: Magnesium: 1.5 mg/dL — ABNORMAL LOW (ref 1.7–2.4)

## 2019-11-18 ENCOUNTER — Telehealth: Payer: Self-pay | Admitting: *Deleted

## 2019-11-18 ENCOUNTER — Other Ambulatory Visit: Payer: Self-pay | Admitting: Physician Assistant

## 2019-11-18 MED ORDER — MAGNESIUM OXIDE 400 (241.3 MG) MG PO TABS: 400.0000 mg | ORAL_TABLET | Freq: Every day | ORAL | 0 refills | Status: DC

## 2019-11-18 NOTE — Telephone Encounter (Signed)
Attempted to reach patient to review her most recent Magnesium level and advise of Rx that was sent to her pharmacy.  Pending call back to review.

## 2019-11-18 NOTE — Telephone Encounter (Signed)
-----   Message from [employer], PA-C sent at 11/18/2019 10:28 AM EST ----- I am going to call in magnesium p.o. can you call and just let her know it is a little low and that I will send in some for once a day.  ----- Message ----- From: Interface, Lab In [facility] Sent: 11/17/2019   2:00 PM EST To: Cassandra L Heilingoetter, PA-C

## 2019-11-18 NOTE — Telephone Encounter (Signed)
Spoke with patient about Magnesium prescription and she expressed understanding.  Will pick up dose tomorrow.

## 2019-11-19 ENCOUNTER — Telehealth: Payer: Self-pay | Admitting: *Deleted

## 2019-11-19 NOTE — Telephone Encounter (Signed)
Oncology Nurse Navigator Documentation  Oncology Nurse Navigator Flowsheets 11/19/2019  Abnormal Finding Date -  Confirmed Diagnosis Date -  Diagnosis Status -  Planned Course of Treatment -  Phase of Treatment -  Chemotherapy Pending- Reason: -  Chemotherapy Actual Start Date: -  Chemotherapy Expected End Date: -  Expected Surgery Date -  Surgery Actual Start Date: -  Navigator Follow Up Date: -  Navigator Follow Up Reason: -  Navigator Location [facility]  Navigator Encounter Type Telephone/I called patient to set her up for nav appt.  I will update transportation services on appt.   Telephone Outgoing Call  Treatment Initiated Date -  Treatment Phase Treatment  Barriers/Navigation Needs Coordination of Care;Education  Education Other  Interventions Coordination of Care;Education  Acuity Level 2-Minimal Needs (1-2 Barriers Identified)  Coordination of Care Appts  Education Method Verbal  Time Spent with Patient 29
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
## 2019-11-24 ENCOUNTER — Inpatient Hospital Stay: Payer: PPO

## 2019-11-24 ENCOUNTER — Other Ambulatory Visit: Payer: Self-pay | Admitting: Internal Medicine

## 2019-11-24 ENCOUNTER — Inpatient Hospital Stay: Payer: PPO | Admitting: Physician Assistant

## 2019-11-24 ENCOUNTER — Other Ambulatory Visit: Payer: Self-pay

## 2019-11-24 VITALS — BP 146/76 | HR 93 | Temp 98.2°F | Resp 18 | Ht 65.0 in | Wt 180.3 lb

## 2019-11-24 DIAGNOSIS — C3492 Malignant neoplasm of unspecified part of left bronchus or lung: Secondary | ICD-10-CM

## 2019-11-24 DIAGNOSIS — Z5111 Encounter for antineoplastic chemotherapy: Secondary | ICD-10-CM

## 2019-11-24 DIAGNOSIS — C3432 Malignant neoplasm of lower lobe, left bronchus or lung: Secondary | ICD-10-CM | POA: Diagnosis not present

## 2019-11-24 LAB — CMP (CANCER CENTER ONLY)
ALT: 13 U/L (ref 0–44)
AST: 12 U/L — ABNORMAL LOW (ref 15–41)
Albumin: 3.2 g/dL — ABNORMAL LOW (ref 3.5–5.0)
Alkaline Phosphatase: 155 U/L — ABNORMAL HIGH (ref 38–126)
Anion gap: 11 (ref 5–15)
BUN: 17 mg/dL (ref 8–23)
CO2: 21 mmol/L — ABNORMAL LOW (ref 22–32)
Calcium: 8.8 mg/dL — ABNORMAL LOW (ref 8.9–10.3)
Chloride: 108 mmol/L (ref 98–111)
Creatinine: 0.91 mg/dL (ref 0.44–1.00)
GFR, Est AFR Am: >60 mL/min (ref >60)
GFR, Estimated: >60 mL/min (ref >60)
Glucose, Bld: 171 mg/dL — ABNORMAL HIGH (ref 70–99)
Potassium: 3.7 mmol/L (ref 3.5–5.1)
Sodium: 140 mmol/L (ref 135–145)
Total Bilirubin: 0.2 mg/dL — ABNORMAL LOW (ref 0.3–1.2)
Total Protein: 6.7 g/dL (ref 6.5–8.1)

## 2019-11-24 LAB — CBC WITH DIFFERENTIAL (CANCER CENTER ONLY)
Abs Immature Granulocytes: 0.07 10*3/uL (ref 0.00–0.07)
Basophils Absolute: 0 10*3/uL (ref 0.0–0.1)
Basophils Relative: 0 %
Eosinophils Absolute: 0 10*3/uL (ref 0.0–0.5)
Eosinophils Relative: 0 %
HCT: 34.6 % — ABNORMAL LOW (ref 36.0–46.0)
Hemoglobin: 11.5 g/dL — ABNORMAL LOW (ref 12.0–15.0)
Immature Granulocytes: 1 %
Lymphocytes Relative: 18 %
Lymphs Abs: 1.7 10*3/uL (ref 0.7–4.0)
MCH: 32 pg (ref 26.0–34.0)
MCHC: 33.2 g/dL (ref 30.0–36.0)
MCV: 96.4 fL (ref 80.0–100.0)
Monocytes Absolute: 0.7 10*3/uL (ref 0.1–1.0)
Monocytes Relative: 7 %
Neutro Abs: 7 10*3/uL (ref 1.7–7.7)
Neutrophils Relative %: 74 %
Platelet Count: 324 10*3/uL (ref 150–400)
RBC: 3.59 MIL/uL — ABNORMAL LOW (ref 3.87–5.11)
RDW: 12.9 % (ref 11.5–15.5)
WBC Count: 9.6 10*3/uL (ref 4.0–10.5)
nRBC: 0 % (ref 0.0–0.2)

## 2019-11-24 LAB — MAGNESIUM: Magnesium: 1.9 mg/dL (ref 1.7–2.4)

## 2019-11-24 MED ORDER — METHYLPREDNISOLONE SODIUM SUCC 125 MG IJ SOLR
INTRAMUSCULAR | Status: AC
  Filled 2019-11-24: qty 2

## 2019-11-24 MED ORDER — POTASSIUM CHLORIDE 2 MEQ/ML IV SOLN
Freq: Once | INTRAVENOUS | Status: AC
  Filled 2019-11-24: qty 10

## 2019-11-24 MED ORDER — SODIUM CHLORIDE 0.9 % IV SOLN
Freq: Once | INTRAVENOUS | Status: AC
  Filled 2019-11-24: qty 250

## 2019-11-24 MED ORDER — FAMOTIDINE IN NACL 20-0.9 MG/50ML-% IV SOLN
20.0000 mg | Freq: Once | INTRAVENOUS | Status: AC
  Administered 2019-11-24: 20 mg via INTRAVENOUS

## 2019-11-24 MED ORDER — SODIUM CHLORIDE 0.9 % IV SOLN
100.0000 mg/m2 | Freq: Once | INTRAVENOUS | Status: AC
  Administered 2019-11-24: 200 mg via INTRAVENOUS
  Filled 2019-11-24: qty 10

## 2019-11-24 MED ORDER — METHYLPREDNISOLONE SODIUM SUCC 125 MG IJ SOLR
125.0000 mg | Freq: Once | INTRAMUSCULAR | Status: AC
  Administered 2019-11-24: 125 mg via INTRAVENOUS

## 2019-11-24 MED ORDER — PALONOSETRON HCL INJECTION 0.25 MG/5ML
INTRAVENOUS | Status: AC
  Filled 2019-11-24: qty 5

## 2019-11-24 MED ORDER — SODIUM CHLORIDE 0.9 % IV SOLN
76.5000 mg/m2 | Freq: Once | INTRAVENOUS | Status: AC
  Administered 2019-11-24: 150 mg via INTRAVENOUS
  Filled 2019-11-24: qty 150

## 2019-11-24 MED ORDER — DIPHENHYDRAMINE HCL 50 MG/ML IJ SOLN
INTRAMUSCULAR | Status: AC
  Filled 2019-11-24: qty 1

## 2019-11-24 MED ORDER — SODIUM CHLORIDE 0.9 % IV SOLN
150.0000 mg | Freq: Once | INTRAVENOUS | Status: AC
  Administered 2019-11-24: 13:00:00 150 mg via INTRAVENOUS
  Filled 2019-11-24: qty 5

## 2019-11-24 MED ORDER — FAMOTIDINE IN NACL 20-0.9 MG/50ML-% IV SOLN
INTRAVENOUS | Status: AC
  Filled 2019-11-24: qty 50

## 2019-11-24 MED ORDER — DIPHENHYDRAMINE HCL 50 MG/ML IJ SOLN
25.0000 mg | Freq: Once | INTRAMUSCULAR | Status: AC
  Administered 2019-11-24: 25 mg via INTRAVENOUS

## 2019-11-24 MED ORDER — PALONOSETRON HCL INJECTION 0.25 MG/5ML
0.2500 mg | Freq: Once | INTRAVENOUS | Status: AC
  Administered 2019-11-24: 0.25 mg via INTRAVENOUS

## 2019-11-24 NOTE — Patient Instructions (Signed)
[facility] Discharge Instructions for Patients Receiving Chemotherapy  Today you received the following chemotherapy agents etopiside; cisplatin  To help prevent nausea and vomiting after your treatment, we encourage you to take your nausea medication as directed   If you develop nausea and vomiting that is not controlled by your nausea medication, call the clinic.   BELOW ARE SYMPTOMS THAT SHOULD BE REPORTED IMMEDIATELY:  *FEVER GREATER THAN 100.5 F  *CHILLS WITH OR WITHOUT FEVER  NAUSEA AND VOMITING THAT IS NOT CONTROLLED WITH YOUR NAUSEA MEDICATION  *UNUSUAL SHORTNESS OF BREATH  *UNUSUAL BRUISING OR BLEEDING  TENDERNESS IN MOUTH AND THROAT WITH OR WITHOUT PRESENCE OF ULCERS  *URINARY PROBLEMS  *BOWEL PROBLEMS  UNUSUAL RASH Items with * indicate a potential emergency and should be followed up as soon as possible.  Feel free to call the clinic should you have any questions or concerns. The clinic phone number is [phone].  Please show the [facility] at check-in to the Emergency Department and triage nurse.

## 2019-11-25 ENCOUNTER — Other Ambulatory Visit: Payer: Self-pay

## 2019-11-25 ENCOUNTER — Inpatient Hospital Stay: Payer: PPO

## 2019-11-25 ENCOUNTER — Inpatient Hospital Stay (HOSPITAL_BASED_OUTPATIENT_CLINIC_OR_DEPARTMENT_OTHER): Payer: PPO | Admitting: *Deleted

## 2019-11-25 VITALS — BP 151/80 | HR 100 | Temp 98.0°F | Resp 16

## 2019-11-25 DIAGNOSIS — C3492 Malignant neoplasm of unspecified part of left bronchus or lung: Secondary | ICD-10-CM

## 2019-11-25 DIAGNOSIS — C3432 Malignant neoplasm of lower lobe, left bronchus or lung: Secondary | ICD-10-CM | POA: Diagnosis not present

## 2019-11-25 MED ORDER — SODIUM CHLORIDE 0.9% FLUSH
10.0000 mL | INTRAVENOUS | Status: DC | PRN
  Filled 2019-11-25: qty 10

## 2019-11-25 MED ORDER — HEPARIN SOD (PORK) LOCK FLUSH 100 UNIT/ML IV SOLN
500.0000 [IU] | Freq: Once | INTRAVENOUS | Status: DC | PRN
  Filled 2019-11-25: qty 5

## 2019-11-25 MED ORDER — DIPHENHYDRAMINE HCL 50 MG/ML IJ SOLN
25.0000 mg | Freq: Once | INTRAMUSCULAR | Status: AC
  Administered 2019-11-25: 25 mg via INTRAVENOUS

## 2019-11-25 MED ORDER — FAMOTIDINE IN NACL 20-0.9 MG/50ML-% IV SOLN
INTRAVENOUS | Status: AC
  Filled 2019-11-25: qty 50

## 2019-11-25 MED ORDER — SODIUM CHLORIDE 0.9 % IV SOLN
Freq: Once | INTRAVENOUS | Status: AC
  Filled 2019-11-25: qty 250

## 2019-11-25 MED ORDER — METHYLPREDNISOLONE SODIUM SUCC 125 MG IJ SOLR
INTRAMUSCULAR | Status: AC
  Filled 2019-11-25: qty 2

## 2019-11-25 MED ORDER — METHYLPREDNISOLONE SODIUM SUCC 125 MG IJ SOLR
125.0000 mg | Freq: Once | INTRAMUSCULAR | Status: AC
  Administered 2019-11-25: 125 mg via INTRAVENOUS

## 2019-11-25 MED ORDER — DIPHENHYDRAMINE HCL 50 MG/ML IJ SOLN
INTRAMUSCULAR | Status: AC
  Filled 2019-11-25: qty 1

## 2019-11-25 MED ORDER — FAMOTIDINE IN NACL 20-0.9 MG/50ML-% IV SOLN
20.0000 mg | Freq: Once | INTRAVENOUS | Status: AC
  Administered 2019-11-25: 20 mg via INTRAVENOUS

## 2019-11-25 MED ORDER — SODIUM CHLORIDE 0.9 % IV SOLN
100.0000 mg/m2 | Freq: Once | INTRAVENOUS | Status: AC
  Administered 2019-11-25: 200 mg via INTRAVENOUS
  Filled 2019-11-25: qty 10

## 2019-11-25 NOTE — Patient Instructions (Signed)
[facility] Discharge Instructions for Patients Receiving Chemotherapy  Today you received the following chemotherapy agents: etoposide  To help prevent nausea and vomiting after your treatment, we encourage you to take your nausea medication as directed.   If you develop nausea and vomiting that is not controlled by your nausea medication, call the clinic.   BELOW ARE SYMPTOMS THAT SHOULD BE REPORTED IMMEDIATELY:  *FEVER GREATER THAN 100.5 F  *CHILLS WITH OR WITHOUT FEVER  NAUSEA AND VOMITING THAT IS NOT CONTROLLED WITH YOUR NAUSEA MEDICATION  *UNUSUAL SHORTNESS OF BREATH  *UNUSUAL BRUISING OR BLEEDING  TENDERNESS IN MOUTH AND THROAT WITH OR WITHOUT PRESENCE OF ULCERS  *URINARY PROBLEMS  *BOWEL PROBLEMS  UNUSUAL RASH Items with * indicate a potential emergency and should be followed up as soon as possible.  Feel free to call the clinic should you have any questions or concerns. The clinic phone number is [phone].  Please show the CHEMO ALERT CARD at check-in to the Emergency Department and triage nurse.   

## 2019-11-26 ENCOUNTER — Inpatient Hospital Stay: Payer: PPO

## 2019-11-26 ENCOUNTER — Other Ambulatory Visit: Payer: Self-pay

## 2019-11-26 VITALS — BP 158/88 | HR 93 | Temp 98.2°F | Resp 18

## 2019-11-26 DIAGNOSIS — C3432 Malignant neoplasm of lower lobe, left bronchus or lung: Secondary | ICD-10-CM | POA: Diagnosis not present

## 2019-11-26 DIAGNOSIS — C3492 Malignant neoplasm of unspecified part of left bronchus or lung: Secondary | ICD-10-CM

## 2019-11-26 MED ORDER — DIPHENHYDRAMINE HCL 50 MG/ML IJ SOLN
INTRAMUSCULAR | Status: AC
  Filled 2019-11-26: qty 1

## 2019-11-26 MED ORDER — SODIUM CHLORIDE 0.9 % IV SOLN
100.0000 mg/m2 | Freq: Once | INTRAVENOUS | Status: AC
  Administered 2019-11-26: 16:00:00 200 mg via INTRAVENOUS
  Filled 2019-11-26: qty 10

## 2019-11-26 MED ORDER — DIPHENHYDRAMINE HCL 50 MG/ML IJ SOLN
25.0000 mg | Freq: Once | INTRAMUSCULAR | Status: AC
  Administered 2019-11-26: 15:00:00 25 mg via INTRAVENOUS

## 2019-11-26 MED ORDER — FAMOTIDINE IN NACL 20-0.9 MG/50ML-% IV SOLN
INTRAVENOUS | Status: AC
  Filled 2019-11-26: qty 50

## 2019-11-26 MED ORDER — METHYLPREDNISOLONE SODIUM SUCC 125 MG IJ SOLR
125.0000 mg | Freq: Once | INTRAMUSCULAR | Status: AC
  Administered 2019-11-26: 15:00:00 125 mg via INTRAVENOUS

## 2019-11-26 MED ORDER — FAMOTIDINE IN NACL 20-0.9 MG/50ML-% IV SOLN
20.0000 mg | Freq: Once | INTRAVENOUS | Status: AC
  Administered 2019-11-26: 15:00:00 20 mg via INTRAVENOUS

## 2019-11-26 MED ORDER — SODIUM CHLORIDE 0.9 % IV SOLN
Freq: Once | INTRAVENOUS | Status: AC
  Filled 2019-11-26: qty 250

## 2019-11-26 MED ORDER — METHYLPREDNISOLONE SODIUM SUCC 125 MG IJ SOLR
INTRAMUSCULAR | Status: AC
  Filled 2019-11-26: qty 2

## 2019-11-26 NOTE — Patient Instructions (Signed)
[facility] Discharge Instructions for Patients Receiving Chemotherapy  Today you received the following chemotherapy agents: etoposide  To help prevent nausea and vomiting after your treatment, we encourage you to take your nausea medication as directed.   If you develop nausea and vomiting that is not controlled by your nausea medication, call the clinic.   BELOW ARE SYMPTOMS THAT SHOULD BE REPORTED IMMEDIATELY:  *FEVER GREATER THAN 100.5 F  *CHILLS WITH OR WITHOUT FEVER  NAUSEA AND VOMITING THAT IS NOT CONTROLLED WITH YOUR NAUSEA MEDICATION  *UNUSUAL SHORTNESS OF BREATH  *UNUSUAL BRUISING OR BLEEDING  TENDERNESS IN MOUTH AND THROAT WITH OR WITHOUT PRESENCE OF ULCERS  *URINARY PROBLEMS  *BOWEL PROBLEMS  UNUSUAL RASH Items with * indicate a potential emergency and should be followed up as soon as possible.  Feel free to call the clinic should you have any questions or concerns. The clinic phone number is [phone].  Please show the CHEMO ALERT CARD at check-in to the Emergency Department and triage nurse.   

## 2019-11-28 ENCOUNTER — Other Ambulatory Visit: Payer: Self-pay

## 2019-11-28 ENCOUNTER — Inpatient Hospital Stay: Payer: PPO

## 2019-11-28 VITALS — BP 139/92 | HR 89 | Temp 97.9°F | Resp 18

## 2019-11-28 DIAGNOSIS — C3492 Malignant neoplasm of unspecified part of left bronchus or lung: Secondary | ICD-10-CM

## 2019-11-28 DIAGNOSIS — C3432 Malignant neoplasm of lower lobe, left bronchus or lung: Secondary | ICD-10-CM | POA: Diagnosis not present

## 2019-11-28 MED ORDER — PEGFILGRASTIM-JMDB 6 MG/0.6ML ~~LOC~~ SOSY
6.0000 mg | PREFILLED_SYRINGE | Freq: Once | SUBCUTANEOUS | Status: AC
  Administered 2019-11-28: 6 mg via SUBCUTANEOUS

## 2019-11-28 MED ORDER — PEGFILGRASTIM-JMDB 6 MG/0.6ML ~~LOC~~ SOSY
PREFILLED_SYRINGE | SUBCUTANEOUS | Status: AC
  Filled 2019-11-28: qty 0.6

## 2019-12-01 ENCOUNTER — Inpatient Hospital Stay: Payer: PPO

## 2019-12-01 ENCOUNTER — Other Ambulatory Visit: Payer: Self-pay

## 2019-12-01 DIAGNOSIS — C3492 Malignant neoplasm of unspecified part of left bronchus or lung: Secondary | ICD-10-CM

## 2019-12-01 DIAGNOSIS — C3432 Malignant neoplasm of lower lobe, left bronchus or lung: Secondary | ICD-10-CM | POA: Diagnosis not present

## 2019-12-01 LAB — CBC WITH DIFFERENTIAL (CANCER CENTER ONLY)
Abs Immature Granulocytes: 0 10*3/uL (ref 0.00–0.07)
Band Neutrophils: 9 %
Basophils Absolute: 0 10*3/uL (ref 0.0–0.1)
Basophils Relative: 0 %
Eosinophils Absolute: 0 10*3/uL (ref 0.0–0.5)
Eosinophils Relative: 0 %
HCT: 35.4 % — ABNORMAL LOW (ref 36.0–46.0)
Hemoglobin: 11.6 g/dL — ABNORMAL LOW (ref 12.0–15.0)
Lymphocytes Relative: 16 %
Lymphs Abs: 1.8 10*3/uL (ref 0.7–4.0)
MCH: 31.6 pg (ref 26.0–34.0)
MCHC: 32.8 g/dL (ref 30.0–36.0)
MCV: 96.5 fL (ref 80.0–100.0)
Monocytes Absolute: 0 10*3/uL — ABNORMAL LOW (ref 0.1–1.0)
Monocytes Relative: 0 %
Neutro Abs: 9.2 10*3/uL — ABNORMAL HIGH (ref 1.7–7.7)
Neutrophils Relative %: 75 %
Platelet Count: 173 10*3/uL (ref 150–400)
RBC: 3.67 MIL/uL — ABNORMAL LOW (ref 3.87–5.11)
RDW: 13 % (ref 11.5–15.5)
WBC Count: 11 10*3/uL — ABNORMAL HIGH (ref 4.0–10.5)
nRBC: 0 % (ref 0.0–0.2)

## 2019-12-01 LAB — CMP (CANCER CENTER ONLY)
ALT: 14 U/L (ref 0–44)
AST: 10 U/L — ABNORMAL LOW (ref 15–41)
Albumin: 3.6 g/dL (ref 3.5–5.0)
Alkaline Phosphatase: 174 U/L — ABNORMAL HIGH (ref 38–126)
Anion gap: 11 (ref 5–15)
BUN: 21 mg/dL (ref 8–23)
CO2: 28 mmol/L (ref 22–32)
Calcium: 8.8 mg/dL — ABNORMAL LOW (ref 8.9–10.3)
Chloride: 102 mmol/L (ref 98–111)
Creatinine: 0.99 mg/dL (ref 0.44–1.00)
GFR, Est AFR Am: >60 mL/min (ref >60)
GFR, Estimated: >60 mL/min (ref >60)
Glucose, Bld: 93 mg/dL (ref 70–99)
Potassium: 4.1 mmol/L (ref 3.5–5.1)
Sodium: 141 mmol/L (ref 135–145)
Total Bilirubin: 0.5 mg/dL (ref 0.3–1.2)
Total Protein: 6.6 g/dL (ref 6.5–8.1)

## 2019-12-01 LAB — MAGNESIUM: Magnesium: 1.9 mg/dL (ref 1.7–2.4)

## 2019-12-08 ENCOUNTER — Inpatient Hospital Stay: Payer: PPO | Attending: Internal Medicine

## 2019-12-08 ENCOUNTER — Other Ambulatory Visit: Payer: Self-pay

## 2019-12-08 DIAGNOSIS — E860 Dehydration: Secondary | ICD-10-CM | POA: Insufficient documentation

## 2019-12-08 DIAGNOSIS — Z5189 Encounter for other specified aftercare: Secondary | ICD-10-CM | POA: Insufficient documentation

## 2019-12-08 DIAGNOSIS — R5383 Other fatigue: Secondary | ICD-10-CM | POA: Diagnosis not present

## 2019-12-08 DIAGNOSIS — R630 Anorexia: Secondary | ICD-10-CM | POA: Insufficient documentation

## 2019-12-08 DIAGNOSIS — K219 Gastro-esophageal reflux disease without esophagitis: Secondary | ICD-10-CM | POA: Insufficient documentation

## 2019-12-08 DIAGNOSIS — R110 Nausea: Secondary | ICD-10-CM | POA: Diagnosis not present

## 2019-12-08 DIAGNOSIS — E039 Hypothyroidism, unspecified: Secondary | ICD-10-CM | POA: Diagnosis not present

## 2019-12-08 DIAGNOSIS — Z79899 Other long term (current) drug therapy: Secondary | ICD-10-CM | POA: Diagnosis not present

## 2019-12-08 DIAGNOSIS — C3432 Malignant neoplasm of lower lobe, left bronchus or lung: Secondary | ICD-10-CM | POA: Diagnosis not present

## 2019-12-08 DIAGNOSIS — Z5111 Encounter for antineoplastic chemotherapy: Secondary | ICD-10-CM | POA: Diagnosis not present

## 2019-12-08 DIAGNOSIS — F329 Major depressive disorder, single episode, unspecified: Secondary | ICD-10-CM | POA: Insufficient documentation

## 2019-12-08 DIAGNOSIS — F17210 Nicotine dependence, cigarettes, uncomplicated: Secondary | ICD-10-CM | POA: Diagnosis not present

## 2019-12-08 DIAGNOSIS — J449 Chronic obstructive pulmonary disease, unspecified: Secondary | ICD-10-CM | POA: Insufficient documentation

## 2019-12-08 DIAGNOSIS — R6883 Chills (without fever): Secondary | ICD-10-CM | POA: Diagnosis not present

## 2019-12-08 DIAGNOSIS — Z791 Long term (current) use of non-steroidal anti-inflammatories (NSAID): Secondary | ICD-10-CM | POA: Insufficient documentation

## 2019-12-08 DIAGNOSIS — R140 Abdominal distension (gaseous): Secondary | ICD-10-CM | POA: Insufficient documentation

## 2019-12-08 DIAGNOSIS — E785 Hyperlipidemia, unspecified: Secondary | ICD-10-CM | POA: Insufficient documentation

## 2019-12-08 DIAGNOSIS — E7800 Pure hypercholesterolemia, unspecified: Secondary | ICD-10-CM | POA: Diagnosis not present

## 2019-12-08 DIAGNOSIS — I10 Essential (primary) hypertension: Secondary | ICD-10-CM | POA: Diagnosis not present

## 2019-12-08 DIAGNOSIS — R61 Generalized hyperhidrosis: Secondary | ICD-10-CM | POA: Insufficient documentation

## 2019-12-08 DIAGNOSIS — F419 Anxiety disorder, unspecified: Secondary | ICD-10-CM | POA: Diagnosis not present

## 2019-12-08 DIAGNOSIS — C3492 Malignant neoplasm of unspecified part of left bronchus or lung: Secondary | ICD-10-CM

## 2019-12-08 LAB — CBC WITH DIFFERENTIAL (CANCER CENTER ONLY)
Abs Immature Granulocytes: 2.9 10*3/uL — ABNORMAL HIGH (ref 0.00–0.07)
Basophils Absolute: 0.1 10*3/uL (ref 0.0–0.1)
Basophils Relative: 1 %
Eosinophils Absolute: 0 10*3/uL (ref 0.0–0.5)
Eosinophils Relative: 0 %
HCT: 33.7 % — ABNORMAL LOW (ref 36.0–46.0)
Hemoglobin: 10.8 g/dL — ABNORMAL LOW (ref 12.0–15.0)
Immature Granulocytes: 19 %
Lymphocytes Relative: 18 %
Lymphs Abs: 2.6 10*3/uL (ref 0.7–4.0)
MCH: 31 pg (ref 26.0–34.0)
MCHC: 32 g/dL (ref 30.0–36.0)
MCV: 96.8 fL (ref 80.0–100.0)
Monocytes Absolute: 1.6 10*3/uL — ABNORMAL HIGH (ref 0.1–1.0)
Monocytes Relative: 11 %
Neutro Abs: 7.7 10*3/uL (ref 1.7–7.7)
Neutrophils Relative %: 51 %
Platelet Count: 96 10*3/uL — ABNORMAL LOW (ref 150–400)
RBC: 3.48 MIL/uL — ABNORMAL LOW (ref 3.87–5.11)
RDW: 13.3 % (ref 11.5–15.5)
WBC Count: 14.9 10*3/uL — ABNORMAL HIGH (ref 4.0–10.5)
nRBC: 0 % (ref 0.0–0.2)

## 2019-12-08 LAB — CMP (CANCER CENTER ONLY)
ALT: 12 U/L (ref 0–44)
AST: 11 U/L — ABNORMAL LOW (ref 15–41)
Albumin: 3.6 g/dL (ref 3.5–5.0)
Alkaline Phosphatase: 170 U/L — ABNORMAL HIGH (ref 38–126)
Anion gap: 8 (ref 5–15)
BUN: 12 mg/dL (ref 8–23)
CO2: 28 mmol/L (ref 22–32)
Calcium: 8.8 mg/dL — ABNORMAL LOW (ref 8.9–10.3)
Chloride: 106 mmol/L (ref 98–111)
Creatinine: 1.01 mg/dL — ABNORMAL HIGH (ref 0.44–1.00)
GFR, Est AFR Am: >60 mL/min (ref >60)
GFR, Estimated: 60 mL/min — ABNORMAL LOW (ref >60)
Glucose, Bld: 100 mg/dL — ABNORMAL HIGH (ref 70–99)
Potassium: 4.1 mmol/L (ref 3.5–5.1)
Sodium: 142 mmol/L (ref 135–145)
Total Bilirubin: <0.2 mg/dL — ABNORMAL LOW (ref 0.3–1.2)
Total Protein: 6.5 g/dL (ref 6.5–8.1)

## 2019-12-08 LAB — MAGNESIUM: Magnesium: 1.6 mg/dL — ABNORMAL LOW (ref 1.7–2.4)

## 2019-12-10 ENCOUNTER — Encounter: Payer: Self-pay | Admitting: *Deleted

## 2019-12-10 ENCOUNTER — Telehealth: Payer: Self-pay | Admitting: *Deleted

## 2019-12-10 NOTE — Telephone Encounter (Signed)
Oncology Nurse Navigator Documentation  Oncology Nurse Navigator Flowsheets 12/10/2019  Abnormal Finding Date -  Confirmed Diagnosis Date -  Diagnosis Status -  Planned Course of Treatment -  Phase of Treatment -  Chemotherapy Pending- Reason: -  Chemotherapy Actual Start Date: -  Chemotherapy Expected End Date: -  Expected Surgery Date -  Surgery Actual Start Date: -  Navigator Follow Up Date: 12/10/2019  Navigator Follow Up Reason: Education  Navigation Complete Date: 12/10/2019  Post Navigation: Continue to Follow Patient? No  Reason Not Navigating Patient: Patient On Maintenance Chemotherapy  Navigator Location [facility]  Navigator Encounter Type Telephone  Telephone Outgoing Call/I followed up patients scheduled and noticed she was seeing CSW next week.  I called to check on patient. She is doing well without complaints and was thankful for me updated CSW about her wishes to get involved with art group.  I listened as she explained how excited she was to do this.  I asked that she call me if needed.   Treatment Initiated Date -  Patient Visit Type -  Treatment Phase Treatment  Barriers/Navigation Needs Education  Education Other  Interventions Education;Psycho-Social Support  Acuity Level 2-Minimal Needs (1-2 Barriers Identified)  Coordination of Care -  Education Method Verbal  Time Spent with Patient 30

## 2019-12-15 ENCOUNTER — Inpatient Hospital Stay: Payer: PPO

## 2019-12-15 ENCOUNTER — Inpatient Hospital Stay: Payer: PPO | Admitting: *Deleted

## 2019-12-15 ENCOUNTER — Inpatient Hospital Stay: Payer: PPO | Admitting: Internal Medicine

## 2019-12-15 ENCOUNTER — Encounter: Payer: Self-pay | Admitting: Internal Medicine

## 2019-12-15 ENCOUNTER — Other Ambulatory Visit: Payer: Self-pay

## 2019-12-15 VITALS — BP 144/83 | HR 99 | Temp 98.9°F | Resp 20 | Ht 65.0 in | Wt 181.8 lb

## 2019-12-15 VITALS — HR 92

## 2019-12-15 DIAGNOSIS — C3492 Malignant neoplasm of unspecified part of left bronchus or lung: Secondary | ICD-10-CM

## 2019-12-15 DIAGNOSIS — Z5111 Encounter for antineoplastic chemotherapy: Secondary | ICD-10-CM | POA: Diagnosis not present

## 2019-12-15 DIAGNOSIS — I10 Essential (primary) hypertension: Secondary | ICD-10-CM | POA: Diagnosis not present

## 2019-12-15 DIAGNOSIS — C3432 Malignant neoplasm of lower lobe, left bronchus or lung: Secondary | ICD-10-CM | POA: Diagnosis not present

## 2019-12-15 LAB — CMP (CANCER CENTER ONLY)
ALT: 10 U/L (ref 0–44)
AST: 7 U/L — ABNORMAL LOW (ref 15–41)
Albumin: 3.3 g/dL — ABNORMAL LOW (ref 3.5–5.0)
Alkaline Phosphatase: 169 U/L — ABNORMAL HIGH (ref 38–126)
Anion gap: 8 (ref 5–15)
BUN: 14 mg/dL (ref 8–23)
CO2: 25 mmol/L (ref 22–32)
Calcium: 8.9 mg/dL (ref 8.9–10.3)
Chloride: 107 mmol/L (ref 98–111)
Creatinine: 0.8 mg/dL (ref 0.44–1.00)
GFR, Est AFR Am: >60 mL/min (ref >60)
GFR, Estimated: >60 mL/min (ref >60)
Glucose, Bld: 103 mg/dL — ABNORMAL HIGH (ref 70–99)
Potassium: 4.3 mmol/L (ref 3.5–5.1)
Sodium: 140 mmol/L (ref 135–145)
Total Bilirubin: 0.2 mg/dL — ABNORMAL LOW (ref 0.3–1.2)
Total Protein: 6.6 g/dL (ref 6.5–8.1)

## 2019-12-15 LAB — CBC WITH DIFFERENTIAL (CANCER CENTER ONLY)
Abs Immature Granulocytes: 0.1 10*3/uL — ABNORMAL HIGH (ref 0.00–0.07)
Basophils Absolute: 0 10*3/uL (ref 0.0–0.1)
Basophils Relative: 0 %
Eosinophils Absolute: 0 10*3/uL (ref 0.0–0.5)
Eosinophils Relative: 0 %
HCT: 31.3 % — ABNORMAL LOW (ref 36.0–46.0)
Hemoglobin: 10.1 g/dL — ABNORMAL LOW (ref 12.0–15.0)
Immature Granulocytes: 1 %
Lymphocytes Relative: 17 %
Lymphs Abs: 1.9 10*3/uL (ref 0.7–4.0)
MCH: 31.3 pg (ref 26.0–34.0)
MCHC: 32.3 g/dL (ref 30.0–36.0)
MCV: 96.9 fL (ref 80.0–100.0)
Monocytes Absolute: 1.4 10*3/uL — ABNORMAL HIGH (ref 0.1–1.0)
Monocytes Relative: 13 %
Neutro Abs: 7.3 10*3/uL (ref 1.7–7.7)
Neutrophils Relative %: 69 %
Platelet Count: 351 10*3/uL (ref 150–400)
RBC: 3.23 MIL/uL — ABNORMAL LOW (ref 3.87–5.11)
RDW: 13.9 % (ref 11.5–15.5)
WBC Count: 10.7 10*3/uL — ABNORMAL HIGH (ref 4.0–10.5)
nRBC: 0 % (ref 0.0–0.2)

## 2019-12-15 LAB — MAGNESIUM: Magnesium: 1.9 mg/dL (ref 1.7–2.4)

## 2019-12-15 MED ORDER — LORAZEPAM 2 MG/ML IJ SOLN: 0.5000 mg | Freq: Once | INTRAMUSCULAR | Status: DC

## 2019-12-15 MED ORDER — METHYLPREDNISOLONE SODIUM SUCC 125 MG IJ SOLR
125.0000 mg | Freq: Once | INTRAMUSCULAR | Status: AC
  Administered 2019-12-15: 125 mg via INTRAVENOUS

## 2019-12-15 MED ORDER — SODIUM CHLORIDE 0.9 % IV SOLN
76.5000 mg/m2 | Freq: Once | INTRAVENOUS | Status: AC
  Administered 2019-12-15: 150 mg via INTRAVENOUS
  Filled 2019-12-15: qty 150

## 2019-12-15 MED ORDER — SODIUM CHLORIDE 0.9 % IV SOLN
100.0000 mg/m2 | Freq: Once | INTRAVENOUS | Status: AC
  Administered 2019-12-15: 200 mg via INTRAVENOUS
  Filled 2019-12-15: qty 10

## 2019-12-15 MED ORDER — FAMOTIDINE IN NACL 20-0.9 MG/50ML-% IV SOLN
20.0000 mg | Freq: Once | INTRAVENOUS | Status: AC
  Administered 2019-12-15: 20 mg via INTRAVENOUS

## 2019-12-15 MED ORDER — METHYLPREDNISOLONE SODIUM SUCC 125 MG IJ SOLR
INTRAMUSCULAR | Status: AC
  Filled 2019-12-15: qty 2

## 2019-12-15 MED ORDER — PALONOSETRON HCL INJECTION 0.25 MG/5ML
0.2500 mg | Freq: Once | INTRAVENOUS | Status: AC
  Administered 2019-12-15: 0.25 mg via INTRAVENOUS

## 2019-12-15 MED ORDER — SODIUM CHLORIDE 0.9 % IV SOLN
150.0000 mg | Freq: Once | INTRAVENOUS | Status: AC
  Administered 2019-12-15: 150 mg via INTRAVENOUS
  Filled 2019-12-15: qty 5

## 2019-12-15 MED ORDER — POTASSIUM CHLORIDE 2 MEQ/ML IV SOLN
Freq: Once | INTRAVENOUS | Status: AC
  Filled 2019-12-15: qty 10

## 2019-12-15 MED ORDER — SODIUM CHLORIDE 0.9 % IV SOLN
Freq: Once | INTRAVENOUS | Status: AC
  Filled 2019-12-15: qty 250

## 2019-12-15 MED ORDER — DIPHENHYDRAMINE HCL 50 MG/ML IJ SOLN
25.0000 mg | Freq: Once | INTRAMUSCULAR | Status: AC
  Administered 2019-12-15: 25 mg via INTRAVENOUS

## 2019-12-15 MED ORDER — DIPHENHYDRAMINE HCL 50 MG/ML IJ SOLN
INTRAMUSCULAR | Status: AC
  Filled 2019-12-15: qty 1

## 2019-12-15 MED ORDER — FAMOTIDINE IN NACL 20-0.9 MG/50ML-% IV SOLN
INTRAVENOUS | Status: AC
  Filled 2019-12-15: qty 50

## 2019-12-15 MED ORDER — PALONOSETRON HCL INJECTION 0.25 MG/5ML
INTRAVENOUS | Status: AC
  Filled 2019-12-15: qty 5

## 2019-12-15 NOTE — Progress Notes (Signed)
Patient finished chemo. 5 minutes into post hydration patient reports she does not feel good. She is feeling very nauseated. She does not feel like she is going to vomit yet but it could happen. Emesis bag provided with a cool cloth. Lucianne Lei, PA called and VO given for 0.5mg  Ativan IV once.   Patient wanted to hold on additional medication for a few minutes to see if this would pass.

## 2019-12-15 NOTE — Patient Instructions (Signed)
[facility] Discharge Instructions for Patients Receiving Chemotherapy  Today you received the following chemotherapy agents etopiside; cisplatin  To help prevent nausea and vomiting after your treatment, we encourage you to take your nausea medication as directed   If you develop nausea and vomiting that is not controlled by your nausea medication, call the clinic.   BELOW ARE SYMPTOMS THAT SHOULD BE REPORTED IMMEDIATELY:  *FEVER GREATER THAN 100.5 F  *CHILLS WITH OR WITHOUT FEVER  NAUSEA AND VOMITING THAT IS NOT CONTROLLED WITH YOUR NAUSEA MEDICATION  *UNUSUAL SHORTNESS OF BREATH  *UNUSUAL BRUISING OR BLEEDING  TENDERNESS IN MOUTH AND THROAT WITH OR WITHOUT PRESENCE OF ULCERS  *URINARY PROBLEMS  *BOWEL PROBLEMS  UNUSUAL RASH Items with * indicate a potential emergency and should be followed up as soon as possible.  Feel free to call the clinic should you have any questions or concerns. The clinic phone number is [phone].  Please show the [place] at check-in to the Emergency Department and triage nurse.
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
## 2019-12-16 ENCOUNTER — Other Ambulatory Visit: Payer: Self-pay

## 2019-12-16 ENCOUNTER — Inpatient Hospital Stay: Payer: PPO

## 2019-12-16 VITALS — BP 120/79 | HR 99 | Temp 98.2°F | Resp 18

## 2019-12-16 DIAGNOSIS — C3492 Malignant neoplasm of unspecified part of left bronchus or lung: Secondary | ICD-10-CM

## 2019-12-16 DIAGNOSIS — C3432 Malignant neoplasm of lower lobe, left bronchus or lung: Secondary | ICD-10-CM | POA: Diagnosis not present

## 2019-12-16 MED ORDER — SODIUM CHLORIDE 0.9 % IV SOLN
Freq: Once | INTRAVENOUS | Status: AC
  Filled 2019-12-16: qty 250

## 2019-12-16 MED ORDER — FAMOTIDINE IN NACL 20-0.9 MG/50ML-% IV SOLN
INTRAVENOUS | Status: AC
  Filled 2019-12-16: qty 50

## 2019-12-16 MED ORDER — FAMOTIDINE IN NACL 20-0.9 MG/50ML-% IV SOLN
20.0000 mg | Freq: Once | INTRAVENOUS | Status: AC
  Administered 2019-12-16: 15:00:00 20 mg via INTRAVENOUS

## 2019-12-16 MED ORDER — METHYLPREDNISOLONE SODIUM SUCC 125 MG IJ SOLR
INTRAMUSCULAR | Status: AC
  Filled 2019-12-16: qty 2

## 2019-12-16 MED ORDER — DIPHENHYDRAMINE HCL 50 MG/ML IJ SOLN
INTRAMUSCULAR | Status: AC
  Filled 2019-12-16: qty 1

## 2019-12-16 MED ORDER — DIPHENHYDRAMINE HCL 50 MG/ML IJ SOLN
25.0000 mg | Freq: Once | INTRAMUSCULAR | Status: AC
  Administered 2019-12-16: 25 mg via INTRAVENOUS

## 2019-12-16 MED ORDER — SODIUM CHLORIDE 0.9 % IV SOLN
100.0000 mg/m2 | Freq: Once | INTRAVENOUS | Status: AC
  Administered 2019-12-16: 200 mg via INTRAVENOUS
  Filled 2019-12-16: qty 10

## 2019-12-16 MED ORDER — METHYLPREDNISOLONE SODIUM SUCC 125 MG IJ SOLR
125.0000 mg | Freq: Once | INTRAMUSCULAR | Status: AC
  Administered 2019-12-16: 125 mg via INTRAVENOUS

## 2019-12-17 ENCOUNTER — Inpatient Hospital Stay: Payer: PPO

## 2019-12-17 ENCOUNTER — Other Ambulatory Visit: Payer: Self-pay | Admitting: Physician Assistant

## 2019-12-17 ENCOUNTER — Other Ambulatory Visit: Payer: Self-pay

## 2019-12-17 VITALS — BP 120/86 | HR 96 | Temp 97.8°F | Resp 18

## 2019-12-17 DIAGNOSIS — C3492 Malignant neoplasm of unspecified part of left bronchus or lung: Secondary | ICD-10-CM

## 2019-12-17 DIAGNOSIS — C3432 Malignant neoplasm of lower lobe, left bronchus or lung: Secondary | ICD-10-CM | POA: Diagnosis not present

## 2019-12-17 MED ORDER — MAGNESIUM OXIDE 400 (241.3 MG) MG PO TABS: 400.0000 mg | ORAL_TABLET | Freq: Every day | ORAL | 1 refills | Status: DC

## 2019-12-17 MED ORDER — DIPHENHYDRAMINE HCL 50 MG/ML IJ SOLN
25.0000 mg | Freq: Once | INTRAMUSCULAR | Status: AC
  Administered 2019-12-17: 15:00:00 25 mg via INTRAVENOUS

## 2019-12-17 MED ORDER — FAMOTIDINE IN NACL 20-0.9 MG/50ML-% IV SOLN
INTRAVENOUS | Status: AC
  Filled 2019-12-17: qty 50

## 2019-12-17 MED ORDER — SODIUM CHLORIDE 0.9 % IV SOLN
Freq: Once | INTRAVENOUS | Status: AC
  Filled 2019-12-17: qty 250

## 2019-12-17 MED ORDER — SODIUM CHLORIDE 0.9 % IV SOLN
100.0000 mg/m2 | Freq: Once | INTRAVENOUS | Status: AC
  Administered 2019-12-17: 200 mg via INTRAVENOUS
  Filled 2019-12-17: qty 10

## 2019-12-17 MED ORDER — METHYLPREDNISOLONE SODIUM SUCC 125 MG IJ SOLR
125.0000 mg | Freq: Once | INTRAMUSCULAR | Status: AC
  Administered 2019-12-17: 125 mg via INTRAVENOUS

## 2019-12-17 MED ORDER — FAMOTIDINE IN NACL 20-0.9 MG/50ML-% IV SOLN
20.0000 mg | Freq: Once | INTRAVENOUS | Status: AC
  Administered 2019-12-17: 20 mg via INTRAVENOUS

## 2019-12-17 MED ORDER — METHYLPREDNISOLONE SODIUM SUCC 125 MG IJ SOLR
INTRAMUSCULAR | Status: AC
  Filled 2019-12-17: qty 2

## 2019-12-17 MED ORDER — DIPHENHYDRAMINE HCL 50 MG/ML IJ SOLN
INTRAMUSCULAR | Status: AC
  Filled 2019-12-17: qty 1

## 2019-12-17 NOTE — Patient Instructions (Signed)
[facility] Discharge Instructions for Patients Receiving Chemotherapy  Today you received the following chemotherapy agents: etoposide  To help prevent nausea and vomiting after your treatment, we encourage you to take your nausea medication as directed.   If you develop nausea and vomiting that is not controlled by your nausea medication, call the clinic.   BELOW ARE SYMPTOMS THAT SHOULD BE REPORTED IMMEDIATELY:  *FEVER GREATER THAN 100.5 F  *CHILLS WITH OR WITHOUT FEVER  NAUSEA AND VOMITING THAT IS NOT CONTROLLED WITH YOUR NAUSEA MEDICATION  *UNUSUAL SHORTNESS OF BREATH  *UNUSUAL BRUISING OR BLEEDING  TENDERNESS IN MOUTH AND THROAT WITH OR WITHOUT PRESENCE OF ULCERS  *URINARY PROBLEMS  *BOWEL PROBLEMS  UNUSUAL RASH Items with * indicate a potential emergency and should be followed up as soon as possible.  Feel free to call the clinic should you have any questions or concerns. The clinic phone number is [phone].  Please show the CHEMO ALERT CARD at check-in to the Emergency Department and triage nurse.   

## 2019-12-18 ENCOUNTER — Encounter: Payer: Self-pay | Admitting: Internal Medicine

## 2019-12-18 NOTE — Progress Notes (Signed)
Patient called requesting financial assistance.  Advised she may apply for one-time $700 [place] to assist with personal expenses while going through treatment. Based on verbal income provided, she will qualify. Advised what is needed as she states she would like to use funds for her rent.  She has my contact information to schedule an appointment to complete process.

## 2019-12-19 ENCOUNTER — Other Ambulatory Visit: Payer: Self-pay

## 2019-12-19 ENCOUNTER — Inpatient Hospital Stay: Payer: PPO

## 2019-12-19 VITALS — BP 144/81 | HR 106 | Temp 98.0°F | Resp 20

## 2019-12-19 DIAGNOSIS — C3492 Malignant neoplasm of unspecified part of left bronchus or lung: Secondary | ICD-10-CM

## 2019-12-19 DIAGNOSIS — C3432 Malignant neoplasm of lower lobe, left bronchus or lung: Secondary | ICD-10-CM | POA: Diagnosis not present

## 2019-12-19 MED ORDER — PEGFILGRASTIM-JMDB 6 MG/0.6ML ~~LOC~~ SOSY
6.0000 mg | PREFILLED_SYRINGE | Freq: Once | SUBCUTANEOUS | Status: AC
  Administered 2019-12-19: 6 mg via SUBCUTANEOUS

## 2019-12-19 MED ORDER — PEGFILGRASTIM-JMDB 6 MG/0.6ML ~~LOC~~ SOSY
PREFILLED_SYRINGE | SUBCUTANEOUS | Status: AC
  Filled 2019-12-19: qty 0.6

## 2019-12-19 NOTE — Patient Instructions (Signed)

## 2019-12-22 ENCOUNTER — Other Ambulatory Visit: Payer: Self-pay

## 2019-12-22 ENCOUNTER — Telehealth: Payer: Self-pay | Admitting: Medical Oncology

## 2019-12-22 ENCOUNTER — Inpatient Hospital Stay: Payer: PPO

## 2019-12-22 ENCOUNTER — Inpatient Hospital Stay (HOSPITAL_BASED_OUTPATIENT_CLINIC_OR_DEPARTMENT_OTHER): Payer: PPO | Admitting: Medical

## 2019-12-22 ENCOUNTER — Other Ambulatory Visit: Payer: Self-pay | Admitting: Emergency Medicine

## 2019-12-22 VITALS — BP 141/84 | HR 83 | Temp 98.9°F | Resp 18 | Ht 65.0 in | Wt 175.6 lb

## 2019-12-22 DIAGNOSIS — E860 Dehydration: Secondary | ICD-10-CM

## 2019-12-22 DIAGNOSIS — C3432 Malignant neoplasm of lower lobe, left bronchus or lung: Secondary | ICD-10-CM | POA: Diagnosis not present

## 2019-12-22 DIAGNOSIS — R110 Nausea: Secondary | ICD-10-CM | POA: Diagnosis not present

## 2019-12-22 LAB — CMP (CANCER CENTER ONLY)
ALT: 13 U/L (ref 0–44)
AST: 10 U/L — ABNORMAL LOW (ref 15–41)
Albumin: 3.6 g/dL (ref 3.5–5.0)
Alkaline Phosphatase: 180 U/L — ABNORMAL HIGH (ref 38–126)
Anion gap: 10 (ref 5–15)
BUN: 21 mg/dL (ref 8–23)
CO2: 25 mmol/L (ref 22–32)
Calcium: 8.8 mg/dL — ABNORMAL LOW (ref 8.9–10.3)
Chloride: 102 mmol/L (ref 98–111)
Creatinine: 1 mg/dL (ref 0.44–1.00)
GFR, Est AFR Am: >60 mL/min (ref >60)
GFR, Estimated: >60 mL/min (ref >60)
Glucose, Bld: 134 mg/dL — ABNORMAL HIGH (ref 70–99)
Potassium: 4 mmol/L (ref 3.5–5.1)
Sodium: 137 mmol/L (ref 135–145)
Total Bilirubin: 0.4 mg/dL (ref 0.3–1.2)
Total Protein: 6.7 g/dL (ref 6.5–8.1)

## 2019-12-22 LAB — CBC WITH DIFFERENTIAL (CANCER CENTER ONLY)
Abs Immature Granulocytes: 0.2 10*3/uL — ABNORMAL HIGH (ref 0.00–0.07)
Band Neutrophils: 3 %
Basophils Absolute: 0.1 10*3/uL (ref 0.0–0.1)
Basophils Relative: 1 %
Eosinophils Absolute: 0 10*3/uL (ref 0.0–0.5)
Eosinophils Relative: 0 %
HCT: 30.4 % — ABNORMAL LOW (ref 36.0–46.0)
Hemoglobin: 9.9 g/dL — ABNORMAL LOW (ref 12.0–15.0)
Lymphocytes Relative: 15 %
Lymphs Abs: 1.8 10*3/uL (ref 0.7–4.0)
MCH: 31.3 pg (ref 26.0–34.0)
MCHC: 32.6 g/dL (ref 30.0–36.0)
MCV: 96.2 fL (ref 80.0–100.0)
Metamyelocytes Relative: 2 %
Monocytes Absolute: 0 10*3/uL — ABNORMAL LOW (ref 0.1–1.0)
Monocytes Relative: 0 %
Neutro Abs: 9.6 10*3/uL — ABNORMAL HIGH (ref 1.7–7.7)
Neutrophils Relative %: 79 %
Platelet Count: 153 10*3/uL (ref 150–400)
RBC: 3.16 MIL/uL — ABNORMAL LOW (ref 3.87–5.11)
RDW: 13.6 % (ref 11.5–15.5)
WBC Count: 11.7 10*3/uL — ABNORMAL HIGH (ref 4.0–10.5)
nRBC: 0 % (ref 0.0–0.2)

## 2019-12-22 LAB — SAMPLE TO BLOOD BANK

## 2019-12-22 LAB — MAGNESIUM: Magnesium: 1.7 mg/dL (ref 1.7–2.4)

## 2019-12-22 MED ORDER — SODIUM CHLORIDE 0.9 % IV SOLN
Freq: Once | INTRAVENOUS | Status: AC
  Filled 2019-12-22: qty 250

## 2019-12-22 MED ORDER — ONDANSETRON HCL 4 MG/2ML IJ SOLN
INTRAMUSCULAR | Status: AC
  Filled 2019-12-22: qty 4

## 2019-12-22 MED ORDER — ONDANSETRON HCL 4 MG/2ML IJ SOLN
8.0000 mg | Freq: Once | INTRAMUSCULAR | Status: AC
  Administered 2019-12-22: 8 mg via INTRAVENOUS

## 2019-12-22 NOTE — Progress Notes (Signed)
Pt received 1L IVF NS today, tolerated well.  VSS.  Able to ambulate to restroom w/out any issues.  Pt declined any food/drink in CC, states she is going home to eat.  Ambulatory to exit with belongings, sister here to take pt home.
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
## 2019-12-22 NOTE — Telephone Encounter (Signed)
Returned call . She has profuse sweating since sat am . Weak ,lightheaded.   Food and fluid intake -down  Denies fever. Appt for [facility] today . Sister notified.

## 2019-12-22 NOTE — Patient Instructions (Signed)

## 2019-12-29 ENCOUNTER — Other Ambulatory Visit: Payer: Self-pay

## 2019-12-29 ENCOUNTER — Inpatient Hospital Stay: Payer: PPO

## 2019-12-29 ENCOUNTER — Telehealth: Payer: Self-pay | Admitting: *Deleted

## 2019-12-29 ENCOUNTER — Encounter: Payer: Self-pay | Admitting: Internal Medicine

## 2019-12-29 DIAGNOSIS — C3432 Malignant neoplasm of lower lobe, left bronchus or lung: Secondary | ICD-10-CM | POA: Diagnosis not present

## 2019-12-29 DIAGNOSIS — C3492 Malignant neoplasm of unspecified part of left bronchus or lung: Secondary | ICD-10-CM

## 2019-12-29 LAB — CBC WITH DIFFERENTIAL (CANCER CENTER ONLY)
Abs Immature Granulocytes: 1.4 10*3/uL — ABNORMAL HIGH (ref 0.00–0.07)
Band Neutrophils: 6 %
Basophils Absolute: 0 10*3/uL (ref 0.0–0.1)
Basophils Relative: 0 %
Eosinophils Absolute: 0 10*3/uL (ref 0.0–0.5)
Eosinophils Relative: 0 %
HCT: 29.1 % — ABNORMAL LOW (ref 36.0–46.0)
Hemoglobin: 9.3 g/dL — ABNORMAL LOW (ref 12.0–15.0)
Lymphocytes Relative: 18 %
Lymphs Abs: 3.2 10*3/uL (ref 0.7–4.0)
MCH: 31.4 pg (ref 26.0–34.0)
MCHC: 32 g/dL (ref 30.0–36.0)
MCV: 98.3 fL (ref 80.0–100.0)
Metamyelocytes Relative: 8 %
Monocytes Absolute: 2.3 10*3/uL — ABNORMAL HIGH (ref 0.1–1.0)
Monocytes Relative: 13 %
Neutro Abs: 10.8 10*3/uL — ABNORMAL HIGH (ref 1.7–7.7)
Neutrophils Relative %: 55 %
Platelet Count: 72 10*3/uL — ABNORMAL LOW (ref 150–400)
RBC: 2.96 MIL/uL — ABNORMAL LOW (ref 3.87–5.11)
RDW: 13.9 % (ref 11.5–15.5)
WBC Count: 17.7 10*3/uL — ABNORMAL HIGH (ref 4.0–10.5)
nRBC: 0 % (ref 0.0–0.2)

## 2019-12-29 LAB — CMP (CANCER CENTER ONLY)
ALT: 11 U/L (ref 0–44)
AST: 11 U/L — ABNORMAL LOW (ref 15–41)
Albumin: 3.5 g/dL (ref 3.5–5.0)
Alkaline Phosphatase: 200 U/L — ABNORMAL HIGH (ref 38–126)
Anion gap: 10 (ref 5–15)
BUN: 12 mg/dL (ref 8–23)
CO2: 27 mmol/L (ref 22–32)
Calcium: 9 mg/dL (ref 8.9–10.3)
Chloride: 105 mmol/L (ref 98–111)
Creatinine: 0.97 mg/dL (ref 0.44–1.00)
GFR, Est AFR Am: >60 mL/min (ref >60)
GFR, Estimated: >60 mL/min (ref >60)
Glucose, Bld: 136 mg/dL — ABNORMAL HIGH (ref 70–99)
Potassium: 3.7 mmol/L (ref 3.5–5.1)
Sodium: 142 mmol/L (ref 135–145)
Total Bilirubin: <0.2 mg/dL — ABNORMAL LOW (ref 0.3–1.2)
Total Protein: 6.4 g/dL — ABNORMAL LOW (ref 6.5–8.1)

## 2019-12-29 LAB — MAGNESIUM: Magnesium: 1.5 mg/dL — ABNORMAL LOW (ref 1.7–2.4)

## 2019-12-29 NOTE — Progress Notes (Signed)
Met with patient at registration to obtain documents and signature for grant.  Patient approved for one-time $1000 [employer] grant to assist with personal expenses while going through treatment. She was given a copy of the approval letter as well as the expense sheet along with the Outpatient pharmacy information. Patient brought expenses to be submitted.  She has my card for any additional financial questions or concerns.

## 2019-12-29 NOTE — Telephone Encounter (Signed)
Reviewed lab results directly with patient over the phone.  Patient did not have any further questions at this point.

## 2019-12-31 ENCOUNTER — Telehealth: Payer: Self-pay | Admitting: *Deleted

## 2019-12-31 NOTE — Telephone Encounter (Signed)
Patient called to request her sister to be at the ringing of the bell.  I followed up with Dr. [name] and Renee Pain.  We were able to work this out and I called patient to update her. She was thankful for this to be able to happen.

## 2020-01-02 ENCOUNTER — Other Ambulatory Visit: Payer: PPO

## 2020-01-02 ENCOUNTER — Other Ambulatory Visit (HOSPITAL_COMMUNITY): Payer: PPO

## 2020-01-05 ENCOUNTER — Inpatient Hospital Stay: Payer: PPO | Admitting: Internal Medicine

## 2020-01-05 ENCOUNTER — Other Ambulatory Visit: Payer: Self-pay

## 2020-01-05 ENCOUNTER — Inpatient Hospital Stay: Payer: PPO

## 2020-01-05 ENCOUNTER — Inpatient Hospital Stay: Payer: PPO | Attending: Internal Medicine

## 2020-01-05 ENCOUNTER — Inpatient Hospital Stay: Payer: PPO | Admitting: Nutrition

## 2020-01-05 ENCOUNTER — Encounter: Payer: Self-pay | Admitting: Internal Medicine

## 2020-01-05 ENCOUNTER — Ambulatory Visit: Payer: PPO | Admitting: Internal Medicine

## 2020-01-05 VITALS — BP 135/80 | HR 106 | Temp 98.0°F | Resp 18 | Ht 65.0 in | Wt 176.1 lb

## 2020-01-05 VITALS — HR 95 | Resp 17

## 2020-01-05 DIAGNOSIS — E039 Hypothyroidism, unspecified: Secondary | ICD-10-CM | POA: Insufficient documentation

## 2020-01-05 DIAGNOSIS — J449 Chronic obstructive pulmonary disease, unspecified: Secondary | ICD-10-CM | POA: Insufficient documentation

## 2020-01-05 DIAGNOSIS — Z5111 Encounter for antineoplastic chemotherapy: Secondary | ICD-10-CM | POA: Diagnosis not present

## 2020-01-05 DIAGNOSIS — C3432 Malignant neoplasm of lower lobe, left bronchus or lung: Secondary | ICD-10-CM | POA: Insufficient documentation

## 2020-01-05 DIAGNOSIS — G2581 Restless legs syndrome: Secondary | ICD-10-CM | POA: Insufficient documentation

## 2020-01-05 DIAGNOSIS — K219 Gastro-esophageal reflux disease without esophagitis: Secondary | ICD-10-CM | POA: Insufficient documentation

## 2020-01-05 DIAGNOSIS — I10 Essential (primary) hypertension: Secondary | ICD-10-CM

## 2020-01-05 DIAGNOSIS — M1990 Unspecified osteoarthritis, unspecified site: Secondary | ICD-10-CM | POA: Diagnosis not present

## 2020-01-05 DIAGNOSIS — R110 Nausea: Secondary | ICD-10-CM | POA: Insufficient documentation

## 2020-01-05 DIAGNOSIS — E7800 Pure hypercholesterolemia, unspecified: Secondary | ICD-10-CM | POA: Insufficient documentation

## 2020-01-05 DIAGNOSIS — Z5189 Encounter for other specified aftercare: Secondary | ICD-10-CM | POA: Diagnosis not present

## 2020-01-05 DIAGNOSIS — R5383 Other fatigue: Secondary | ICD-10-CM | POA: Diagnosis not present

## 2020-01-05 DIAGNOSIS — Z79899 Other long term (current) drug therapy: Secondary | ICD-10-CM | POA: Insufficient documentation

## 2020-01-05 DIAGNOSIS — R0602 Shortness of breath: Secondary | ICD-10-CM | POA: Diagnosis not present

## 2020-01-05 DIAGNOSIS — C3492 Malignant neoplasm of unspecified part of left bronchus or lung: Secondary | ICD-10-CM

## 2020-01-05 DIAGNOSIS — F419 Anxiety disorder, unspecified: Secondary | ICD-10-CM | POA: Insufficient documentation

## 2020-01-05 DIAGNOSIS — F329 Major depressive disorder, single episode, unspecified: Secondary | ICD-10-CM | POA: Insufficient documentation

## 2020-01-05 DIAGNOSIS — Z791 Long term (current) use of non-steroidal anti-inflammatories (NSAID): Secondary | ICD-10-CM | POA: Diagnosis not present

## 2020-01-05 LAB — CMP (CANCER CENTER ONLY)
ALT: 6 U/L (ref 0–44)
AST: 7 U/L — ABNORMAL LOW (ref 15–41)
Albumin: 3.3 g/dL — ABNORMAL LOW (ref 3.5–5.0)
Alkaline Phosphatase: 179 U/L — ABNORMAL HIGH (ref 38–126)
Anion gap: 9 (ref 5–15)
BUN: 16 mg/dL (ref 8–23)
CO2: 24 mmol/L (ref 22–32)
Calcium: 9 mg/dL (ref 8.9–10.3)
Chloride: 108 mmol/L (ref 98–111)
Creatinine: 0.9 mg/dL (ref 0.44–1.00)
GFR, Est AFR Am: >60 mL/min (ref >60)
GFR, Estimated: >60 mL/min (ref >60)
Glucose, Bld: 103 mg/dL — ABNORMAL HIGH (ref 70–99)
Potassium: 4.3 mmol/L (ref 3.5–5.1)
Sodium: 141 mmol/L (ref 135–145)
Total Bilirubin: 0.3 mg/dL (ref 0.3–1.2)
Total Protein: 6.6 g/dL (ref 6.5–8.1)

## 2020-01-05 LAB — CBC WITH DIFFERENTIAL (CANCER CENTER ONLY)
Abs Immature Granulocytes: 0.05 10*3/uL (ref 0.00–0.07)
Basophils Absolute: 0 10*3/uL (ref 0.0–0.1)
Basophils Relative: 0 %
Eosinophils Absolute: 0 10*3/uL (ref 0.0–0.5)
Eosinophils Relative: 0 %
HCT: 27.7 % — ABNORMAL LOW (ref 36.0–46.0)
Hemoglobin: 9.1 g/dL — ABNORMAL LOW (ref 12.0–15.0)
Immature Granulocytes: 1 %
Lymphocytes Relative: 24 %
Lymphs Abs: 2.2 10*3/uL (ref 0.7–4.0)
MCH: 31.3 pg (ref 26.0–34.0)
MCHC: 32.9 g/dL (ref 30.0–36.0)
MCV: 95.2 fL (ref 80.0–100.0)
Monocytes Absolute: 0.9 10*3/uL (ref 0.1–1.0)
Monocytes Relative: 10 %
Neutro Abs: 5.7 10*3/uL (ref 1.7–7.7)
Neutrophils Relative %: 65 %
Platelet Count: 273 10*3/uL (ref 150–400)
RBC: 2.91 MIL/uL — ABNORMAL LOW (ref 3.87–5.11)
RDW: 14.6 % (ref 11.5–15.5)
WBC Count: 8.9 10*3/uL (ref 4.0–10.5)
nRBC: 0 % (ref 0.0–0.2)

## 2020-01-05 LAB — MAGNESIUM: Magnesium: 1.8 mg/dL (ref 1.7–2.4)

## 2020-01-05 MED ORDER — LORAZEPAM 2 MG/ML IJ SOLN
INTRAMUSCULAR | Status: AC
  Filled 2020-01-05: qty 1

## 2020-01-05 MED ORDER — SODIUM CHLORIDE 0.9 % IV SOLN
76.5000 mg/m2 | Freq: Once | INTRAVENOUS | Status: AC
  Administered 2020-01-05: 150 mg via INTRAVENOUS
  Filled 2020-01-05: qty 150

## 2020-01-05 MED ORDER — PALONOSETRON HCL INJECTION 0.25 MG/5ML
INTRAVENOUS | Status: AC
  Filled 2020-01-05: qty 5

## 2020-01-05 MED ORDER — SODIUM CHLORIDE 0.9 % IV SOLN
150.0000 mg | Freq: Once | INTRAVENOUS | Status: AC
  Administered 2020-01-05: 150 mg via INTRAVENOUS
  Filled 2020-01-05: qty 150

## 2020-01-05 MED ORDER — FAMOTIDINE IN NACL 20-0.9 MG/50ML-% IV SOLN
INTRAVENOUS | Status: AC
  Filled 2020-01-05: qty 50

## 2020-01-05 MED ORDER — SODIUM CHLORIDE 0.9 % IV SOLN
Freq: Once | INTRAVENOUS | Status: AC
  Filled 2020-01-05: qty 250

## 2020-01-05 MED ORDER — LORAZEPAM 2 MG/ML IJ SOLN
0.5000 mg | Freq: Once | INTRAMUSCULAR | Status: AC
  Administered 2020-01-05: 0.5 mg via INTRAVENOUS

## 2020-01-05 MED ORDER — METHYLPREDNISOLONE SODIUM SUCC 125 MG IJ SOLR
INTRAMUSCULAR | Status: AC
  Filled 2020-01-05: qty 2

## 2020-01-05 MED ORDER — DIPHENHYDRAMINE HCL 50 MG/ML IJ SOLN
INTRAMUSCULAR | Status: AC
  Filled 2020-01-05: qty 1

## 2020-01-05 MED ORDER — FAMOTIDINE IN NACL 20-0.9 MG/50ML-% IV SOLN
20.0000 mg | Freq: Once | INTRAVENOUS | Status: AC
  Administered 2020-01-05: 20 mg via INTRAVENOUS

## 2020-01-05 MED ORDER — DIPHENHYDRAMINE HCL 50 MG/ML IJ SOLN
25.0000 mg | Freq: Once | INTRAMUSCULAR | Status: AC
  Administered 2020-01-05: 25 mg via INTRAVENOUS

## 2020-01-05 MED ORDER — POTASSIUM CHLORIDE 2 MEQ/ML IV SOLN
Freq: Once | INTRAVENOUS | Status: AC
  Filled 2020-01-05: qty 10

## 2020-01-05 MED ORDER — METHYLPREDNISOLONE SODIUM SUCC 125 MG IJ SOLR
125.0000 mg | Freq: Once | INTRAMUSCULAR | Status: AC
  Administered 2020-01-05: 125 mg via INTRAVENOUS

## 2020-01-05 MED ORDER — SODIUM CHLORIDE 0.9 % IV SOLN
100.0000 mg/m2 | Freq: Once | INTRAVENOUS | Status: AC
  Administered 2020-01-05: 200 mg via INTRAVENOUS
  Filled 2020-01-05: qty 10

## 2020-01-05 MED ORDER — PALONOSETRON HCL INJECTION 0.25 MG/5ML
0.2500 mg | Freq: Once | INTRAVENOUS | Status: AC
  Administered 2020-01-05: 0.25 mg via INTRAVENOUS

## 2020-01-05 NOTE — Progress Notes (Signed)
Patient was identified to be at risk for malnutrition on the MST secondary to poor appetite and weight loss.  64 year old female diagnosed with non-small cell lung cancer.  She is a patient of Dr. [name] receiving cisplatin and etoposide.  Past medical history includes anxiety, COPD, depression, diverticulitis, GERD, and gout.  Medications include Lexapro, magnesium oxide, multivitamin, Percocet, Prilosec, and Compazine.  Labs include glucose 103 and albumin 3.3.  Height: 65 inches. Weight: 176.1 pounds. Usual body weight: 180-185 pounds. BMI: 29.3.  Patient reports she has had some fatigue and nausea.  Patient has nausea medication available. Reports she generally eats okay. Endorses a few pounds weight loss.  Nutrition diagnosis:  Unintended weight loss related to cancer and associated treatments as evidenced by 4 pound weight loss from usual body weight.  Intervention: Educated patient to consume smaller more frequent meals and snacks with adequate calories and protein. Reviewed strategies for improving nausea. Provided contact information for questions or concerns.  Monitoring, evaluation, goals: Patient will tolerate adequate calories and protein to minimize weight loss.  Next visit: Patient will contact RD with questions or concerns.  **Disclaimer: This note was dictated with voice recognition software. Similar sounding words can inadvertently be transcribed and this note may contain transcription errors which may not have been corrected upon publication of note.**

## 2020-01-05 NOTE — Progress Notes (Signed)
Around 16:00 patient began c/o breakthrough nausea and "just not feeling good". Called Lucianne Lei Tanner-PA with update and received verbal orders for 0.5mg  Ativan IV. Orders placed, post-hydration fluids paused, line flushed with normal saline, and medication administered. Will continue to monitor.

## 2020-01-05 NOTE — Patient Instructions (Signed)
COVID-19 Vaccine Information can be found at: [email or web address] For questions related to vaccine distribution or appointments, please email [email or web address] or call [phone].   [facility] Discharge Instructions for Patients Receiving Chemotherapy  Today you received the following chemotherapy agents: Cisplatin (Platinol) and Etoposide (Vepesid, VP-16)  To help prevent nausea and vomiting after your treatment, we encourage you to take your nausea medication as directed by your provider.   If you develop nausea and vomiting that is not controlled by your nausea medication, call the clinic.   BELOW ARE SYMPTOMS THAT SHOULD BE REPORTED IMMEDIATELY:  *FEVER GREATER THAN 100.5 F  *CHILLS WITH OR WITHOUT FEVER  NAUSEA AND VOMITING THAT IS NOT CONTROLLED WITH YOUR NAUSEA MEDICATION  *UNUSUAL SHORTNESS OF BREATH  *UNUSUAL BRUISING OR BLEEDING  TENDERNESS IN MOUTH AND THROAT WITH OR WITHOUT PRESENCE OF ULCERS  *URINARY PROBLEMS  *BOWEL PROBLEMS  UNUSUAL RASH Items with * indicate a potential emergency and should be followed up as soon as possible.  Feel free to call the clinic should you have any questions or concerns. The clinic phone number is [phone].  Please show the [facility] at check-in to the Emergency Department and triage nurse.  Coronavirus (COVID-19) Are you at risk?  Are you at risk for the Coronavirus (COVID-19)?  To be considered HIGH RISK for Coronavirus (COVID-19), you have to meet the following criteria:  . Traveled to [place], [place], [place], [place] or [place]; or in the [place] to [place], [place], [place], or [place]; and have fever, cough, and shortness of breath within the last 2 weeks of travel OR . Been in close contact with a person diagnosed with COVID-19 within the last 2 weeks and have fever, cough, and shortness of breath . IF YOU DO NOT MEET  THESE CRITERIA, YOU ARE CONSIDERED LOW RISK FOR COVID-19.  What to do if you are HIGH RISK for COVID-19?  Marland Kitchen If you are having a medical emergency, call 911. . Seek medical care right away. Before you go to a doctor's office, urgent care or emergency department, call ahead and tell them about your recent travel, contact with someone diagnosed with COVID-19, and your symptoms. You should receive instructions from your physician's office regarding next steps of care.  . When you arrive at healthcare provider, tell the healthcare staff immediately you have returned from visiting [place], [place], [place], [place] or [place]; or traveled in the [place] to [place], [place], [place], or [place]; in the last two weeks or you have been in close contact with a person diagnosed with COVID-19 in the last 2 weeks.   . Tell the health care staff about your symptoms: fever, cough and shortness of breath. . After you have been seen by a medical provider, you will be either: o Tested for (COVID-19) and discharged home on quarantine except to seek medical care if symptoms worsen, and asked to  - Stay home and avoid contact with others until you get your results (4-5 days)  - Avoid travel on public transportation if possible (such as bus, train, or airplane) or o Sent to the Emergency Department by EMS for evaluation, COVID-19 testing, and possible admission depending on your condition and test results.  What to do if you are LOW RISK for COVID-19?  Reduce your risk of any infection by using the same precautions used for avoiding the common cold or flu:  Marland Kitchen Wash your hands often with soap and  warm water for at least 20 seconds.  If soap and water are not readily available, use an alcohol-based hand sanitizer with at least 60% alcohol.  . If coughing or sneezing, cover your mouth and nose by coughing or sneezing into the elbow areas of your shirt or coat, into a tissue or into your sleeve (not your  hands). . Avoid shaking hands with others and consider head nods or verbal greetings only. . Avoid touching your eyes, nose, or mouth with unwashed hands.  . Avoid close contact with people who are sick. . Avoid places or events with large numbers of people in one location, like concerts or sporting events. . Carefully consider travel plans you have or are making. . If you are planning any travel outside or inside the [place], visit the CDC's Travelers' Health webpage for the latest health notices. . If you have some symptoms but not all symptoms, continue to monitor at home and seek medical attention if your symptoms worsen. . If you are having a medical emergency, call 911.   [place] / e-Visit: [email or web address]         [facility]: [facility]: [phone]                   [facility]: [phone]
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
## 2020-01-06 ENCOUNTER — Inpatient Hospital Stay: Payer: PPO

## 2020-01-06 ENCOUNTER — Telehealth: Payer: Self-pay | Admitting: Internal Medicine

## 2020-01-06 ENCOUNTER — Other Ambulatory Visit: Payer: Self-pay

## 2020-01-06 VITALS — BP 136/72 | HR 97 | Temp 98.5°F | Resp 18

## 2020-01-06 DIAGNOSIS — C3492 Malignant neoplasm of unspecified part of left bronchus or lung: Secondary | ICD-10-CM

## 2020-01-06 DIAGNOSIS — C3432 Malignant neoplasm of lower lobe, left bronchus or lung: Secondary | ICD-10-CM | POA: Diagnosis not present

## 2020-01-06 MED ORDER — SODIUM CHLORIDE 0.9 % IV SOLN
100.0000 mg/m2 | Freq: Once | INTRAVENOUS | Status: AC
  Administered 2020-01-06: 200 mg via INTRAVENOUS
  Filled 2020-01-06: qty 10

## 2020-01-06 MED ORDER — METHYLPREDNISOLONE SODIUM SUCC 125 MG IJ SOLR
125.0000 mg | Freq: Once | INTRAMUSCULAR | Status: AC
  Administered 2020-01-06: 125 mg via INTRAVENOUS

## 2020-01-06 MED ORDER — SODIUM CHLORIDE 0.9 % IV SOLN
Freq: Once | INTRAVENOUS | Status: AC
  Filled 2020-01-06: qty 250

## 2020-01-06 MED ORDER — DIPHENHYDRAMINE HCL 50 MG/ML IJ SOLN
25.0000 mg | Freq: Once | INTRAMUSCULAR | Status: AC
  Administered 2020-01-06: 25 mg via INTRAVENOUS

## 2020-01-06 MED ORDER — FAMOTIDINE IN NACL 20-0.9 MG/50ML-% IV SOLN
20.0000 mg | Freq: Once | INTRAVENOUS | Status: AC
  Administered 2020-01-06: 20 mg via INTRAVENOUS

## 2020-01-06 MED ORDER — DIPHENHYDRAMINE HCL 50 MG/ML IJ SOLN
INTRAMUSCULAR | Status: AC
  Filled 2020-01-06: qty 1

## 2020-01-06 MED ORDER — FAMOTIDINE IN NACL 20-0.9 MG/50ML-% IV SOLN
INTRAVENOUS | Status: AC
  Filled 2020-01-06: qty 50

## 2020-01-06 MED ORDER — METHYLPREDNISOLONE SODIUM SUCC 125 MG IJ SOLR
INTRAMUSCULAR | Status: AC
  Filled 2020-01-06: qty 2

## 2020-01-06 NOTE — Telephone Encounter (Signed)
Scheduled per 3/1 los. Called and spoke with patient. Confirmed appt

## 2020-01-06 NOTE — Patient Instructions (Signed)
[facility] Discharge Instructions for Patients Receiving Chemotherapy  Today you received the following chemotherapy agents Etoposide  To help prevent nausea and vomiting after your treatment, we encourage you to take your nausea medication as directed.  If you develop nausea and vomiting that is not controlled by your nausea medication, call the clinic.   BELOW ARE SYMPTOMS THAT SHOULD BE REPORTED IMMEDIATELY:  *FEVER GREATER THAN 100.5 F  *CHILLS WITH OR WITHOUT FEVER  NAUSEA AND VOMITING THAT IS NOT CONTROLLED WITH YOUR NAUSEA MEDICATION  *UNUSUAL SHORTNESS OF BREATH  *UNUSUAL BRUISING OR BLEEDING  TENDERNESS IN MOUTH AND THROAT WITH OR WITHOUT PRESENCE OF ULCERS  *URINARY PROBLEMS  *BOWEL PROBLEMS  UNUSUAL RASH Items with * indicate a potential emergency and should be followed up as soon as possible.  Feel free to call the clinic should you have any questions or concerns. The clinic phone number is [phone].  Please show the [facility] at check-in to the Emergency Department and triage nurse.

## 2020-01-07 ENCOUNTER — Inpatient Hospital Stay: Payer: PPO

## 2020-01-07 ENCOUNTER — Telehealth: Payer: Self-pay | Admitting: Adult Health

## 2020-01-07 VITALS — BP 116/88 | HR 100 | Temp 98.7°F | Resp 20

## 2020-01-07 DIAGNOSIS — C3492 Malignant neoplasm of unspecified part of left bronchus or lung: Secondary | ICD-10-CM

## 2020-01-07 DIAGNOSIS — C3432 Malignant neoplasm of lower lobe, left bronchus or lung: Secondary | ICD-10-CM | POA: Diagnosis not present

## 2020-01-07 MED ORDER — SODIUM CHLORIDE 0.9 % IV SOLN
Freq: Once | INTRAVENOUS | Status: AC
  Filled 2020-01-07: qty 250

## 2020-01-07 MED ORDER — DIPHENHYDRAMINE HCL 50 MG/ML IJ SOLN
25.0000 mg | Freq: Once | INTRAMUSCULAR | Status: AC
  Administered 2020-01-07: 25 mg via INTRAVENOUS

## 2020-01-07 MED ORDER — METHYLPREDNISOLONE SODIUM SUCC 125 MG IJ SOLR
125.0000 mg | Freq: Once | INTRAMUSCULAR | Status: AC
  Administered 2020-01-07: 125 mg via INTRAVENOUS

## 2020-01-07 MED ORDER — FAMOTIDINE IN NACL 20-0.9 MG/50ML-% IV SOLN
20.0000 mg | Freq: Once | INTRAVENOUS | Status: AC
  Administered 2020-01-07: 20 mg via INTRAVENOUS

## 2020-01-07 MED ORDER — DIPHENHYDRAMINE HCL 50 MG/ML IJ SOLN
INTRAMUSCULAR | Status: AC
  Filled 2020-01-07: qty 1

## 2020-01-07 MED ORDER — METHYLPREDNISOLONE SODIUM SUCC 125 MG IJ SOLR
INTRAMUSCULAR | Status: AC
  Filled 2020-01-07: qty 2

## 2020-01-07 MED ORDER — FAMOTIDINE IN NACL 20-0.9 MG/50ML-% IV SOLN
INTRAVENOUS | Status: AC
  Filled 2020-01-07: qty 50

## 2020-01-07 MED ORDER — SODIUM CHLORIDE 0.9 % IV SOLN
100.0000 mg/m2 | Freq: Once | INTRAVENOUS | Status: AC
  Administered 2020-01-07: 200 mg via INTRAVENOUS
  Filled 2020-01-07: qty 10

## 2020-01-07 NOTE — Telephone Encounter (Signed)
Routing to [employer] for pt.

## 2020-01-09 ENCOUNTER — Inpatient Hospital Stay: Payer: PPO

## 2020-01-09 ENCOUNTER — Other Ambulatory Visit: Payer: Self-pay

## 2020-01-09 VITALS — BP 125/87 | HR 98 | Temp 98.7°F | Resp 20

## 2020-01-09 DIAGNOSIS — C3432 Malignant neoplasm of lower lobe, left bronchus or lung: Secondary | ICD-10-CM | POA: Diagnosis not present

## 2020-01-09 DIAGNOSIS — C3492 Malignant neoplasm of unspecified part of left bronchus or lung: Secondary | ICD-10-CM

## 2020-01-09 MED ORDER — PEGFILGRASTIM-JMDB 6 MG/0.6ML ~~LOC~~ SOSY
6.0000 mg | PREFILLED_SYRINGE | Freq: Once | SUBCUTANEOUS | Status: AC
  Administered 2020-01-09: 14:00:00 6 mg via SUBCUTANEOUS

## 2020-01-09 MED ORDER — PEGFILGRASTIM-JMDB 6 MG/0.6ML ~~LOC~~ SOSY
PREFILLED_SYRINGE | SUBCUTANEOUS | Status: AC
  Filled 2020-01-09: qty 0.6

## 2020-01-09 NOTE — Patient Instructions (Signed)

## 2020-01-10 ENCOUNTER — Other Ambulatory Visit: Payer: Self-pay | Admitting: Physician Assistant

## 2020-01-12 ENCOUNTER — Other Ambulatory Visit: Payer: Self-pay

## 2020-01-12 ENCOUNTER — Inpatient Hospital Stay: Payer: PPO

## 2020-01-12 DIAGNOSIS — C3432 Malignant neoplasm of lower lobe, left bronchus or lung: Secondary | ICD-10-CM | POA: Diagnosis not present

## 2020-01-12 DIAGNOSIS — C3492 Malignant neoplasm of unspecified part of left bronchus or lung: Secondary | ICD-10-CM

## 2020-01-12 LAB — CMP (CANCER CENTER ONLY)
ALT: 11 U/L (ref 0–44)
AST: 8 U/L — ABNORMAL LOW (ref 15–41)
Albumin: 3.5 g/dL (ref 3.5–5.0)
Alkaline Phosphatase: 164 U/L — ABNORMAL HIGH (ref 38–126)
Anion gap: 7 (ref 5–15)
BUN: 27 mg/dL — ABNORMAL HIGH (ref 8–23)
CO2: 26 mmol/L (ref 22–32)
Calcium: 8.9 mg/dL (ref 8.9–10.3)
Chloride: 106 mmol/L (ref 98–111)
Creatinine: 0.98 mg/dL (ref 0.44–1.00)
GFR, Est AFR Am: >60 mL/min (ref >60)
GFR, Estimated: >60 mL/min (ref >60)
Glucose, Bld: 111 mg/dL — ABNORMAL HIGH (ref 70–99)
Potassium: 4.3 mmol/L (ref 3.5–5.1)
Sodium: 139 mmol/L (ref 135–145)
Total Bilirubin: 0.5 mg/dL (ref 0.3–1.2)
Total Protein: 6.1 g/dL — ABNORMAL LOW (ref 6.5–8.1)

## 2020-01-12 LAB — CBC WITH DIFFERENTIAL (CANCER CENTER ONLY)
Abs Immature Granulocytes: 0 10*3/uL (ref 0.00–0.07)
Band Neutrophils: 2 %
Basophils Absolute: 0 10*3/uL (ref 0.0–0.1)
Basophils Relative: 0 %
Eosinophils Absolute: 0 10*3/uL (ref 0.0–0.5)
Eosinophils Relative: 0 %
HCT: 24.2 % — ABNORMAL LOW (ref 36.0–46.0)
Hemoglobin: 7.9 g/dL — ABNORMAL LOW (ref 12.0–15.0)
Lymphocytes Relative: 13 %
Lymphs Abs: 1.1 10*3/uL (ref 0.7–4.0)
MCH: 32 pg (ref 26.0–34.0)
MCHC: 32.6 g/dL (ref 30.0–36.0)
MCV: 98 fL (ref 80.0–100.0)
Monocytes Absolute: 0.1 10*3/uL (ref 0.1–1.0)
Monocytes Relative: 1 %
Neutro Abs: 7.1 10*3/uL (ref 1.7–7.7)
Neutrophils Relative %: 84 %
Platelet Count: 91 10*3/uL — ABNORMAL LOW (ref 150–400)
RBC: 2.47 MIL/uL — ABNORMAL LOW (ref 3.87–5.11)
RDW: 15.1 % (ref 11.5–15.5)
WBC Count: 8.3 10*3/uL (ref 4.0–10.5)
nRBC: 0 % (ref 0.0–0.2)

## 2020-01-12 LAB — MAGNESIUM: Magnesium: 1.5 mg/dL — ABNORMAL LOW (ref 1.7–2.4)

## 2020-01-16 ENCOUNTER — Telehealth: Payer: Self-pay

## 2020-01-16 ENCOUNTER — Telehealth: Payer: Self-pay | Admitting: Physician Assistant

## 2020-01-16 ENCOUNTER — Other Ambulatory Visit: Payer: Self-pay | Admitting: Physician Assistant

## 2020-01-16 DIAGNOSIS — C3492 Malignant neoplasm of unspecified part of left bronchus or lung: Secondary | ICD-10-CM

## 2020-01-16 NOTE — Telephone Encounter (Signed)
Called the patient and reviewed her labs with her. Her hemoglobin 7.9  This week. We will recheck her labs on Monday 3/15. Added a extra sample to blood bank in case anemia worsens. Discussed that now that she is off chemotherapy we expect her number to trend upwards. I will call her to review her results with her. She expressed understanding.

## 2020-01-16 NOTE — Telephone Encounter (Signed)
Received call from pt complaining of not feeling good light headed and dizzy. Advised that if she feels really bad can go to the ED. Patient insisted on speaking with Dr. [name] or Cassie to give her a call.  Cassie, PA informed.

## 2020-01-19 ENCOUNTER — Other Ambulatory Visit: Payer: Self-pay | Admitting: Physician Assistant

## 2020-01-19 ENCOUNTER — Inpatient Hospital Stay: Payer: PPO

## 2020-01-19 ENCOUNTER — Other Ambulatory Visit: Payer: Self-pay

## 2020-01-19 ENCOUNTER — Telehealth: Payer: Self-pay

## 2020-01-19 DIAGNOSIS — D649 Anemia, unspecified: Secondary | ICD-10-CM

## 2020-01-19 DIAGNOSIS — C3432 Malignant neoplasm of lower lobe, left bronchus or lung: Secondary | ICD-10-CM | POA: Diagnosis not present

## 2020-01-19 DIAGNOSIS — C3492 Malignant neoplasm of unspecified part of left bronchus or lung: Secondary | ICD-10-CM

## 2020-01-19 LAB — SAMPLE TO BLOOD BANK

## 2020-01-19 LAB — CBC WITH DIFFERENTIAL (CANCER CENTER ONLY)
Abs Immature Granulocytes: 0.41 10*3/uL — ABNORMAL HIGH (ref 0.00–0.07)
Basophils Absolute: 0 10*3/uL (ref 0.0–0.1)
Basophils Relative: 0 %
Eosinophils Absolute: 0 10*3/uL (ref 0.0–0.5)
Eosinophils Relative: 0 %
HCT: 21.6 % — ABNORMAL LOW (ref 36.0–46.0)
Hemoglobin: 7 g/dL — ABNORMAL LOW (ref 12.0–15.0)
Immature Granulocytes: 5 %
Lymphocytes Relative: 26 %
Lymphs Abs: 2 10*3/uL (ref 0.7–4.0)
MCH: 31.7 pg (ref 26.0–34.0)
MCHC: 32.4 g/dL (ref 30.0–36.0)
MCV: 97.7 fL (ref 80.0–100.0)
Monocytes Absolute: 0.9 10*3/uL (ref 0.1–1.0)
Monocytes Relative: 12 %
Neutro Abs: 4.2 10*3/uL (ref 1.7–7.7)
Neutrophils Relative %: 57 %
Platelet Count: 20 10*3/uL — ABNORMAL LOW (ref 150–400)
RBC: 2.21 MIL/uL — ABNORMAL LOW (ref 3.87–5.11)
RDW: 14.9 % (ref 11.5–15.5)
WBC Count: 7.5 10*3/uL (ref 4.0–10.5)
nRBC: 0 % (ref 0.0–0.2)

## 2020-01-19 LAB — ABO/RH: ABO/RH(D): O POS

## 2020-01-19 LAB — CMP (CANCER CENTER ONLY)
ALT: 6 U/L (ref 0–44)
AST: 9 U/L — ABNORMAL LOW (ref 15–41)
Albumin: 3.6 g/dL (ref 3.5–5.0)
Alkaline Phosphatase: 193 U/L — ABNORMAL HIGH (ref 38–126)
Anion gap: 15 (ref 5–15)
BUN: 17 mg/dL (ref 8–23)
CO2: 24 mmol/L (ref 22–32)
Calcium: 8.9 mg/dL (ref 8.9–10.3)
Chloride: 105 mmol/L (ref 98–111)
Creatinine: 1.2 mg/dL — ABNORMAL HIGH (ref 0.44–1.00)
GFR, Est AFR Am: 56 mL/min — ABNORMAL LOW (ref >60)
GFR, Estimated: 48 mL/min — ABNORMAL LOW (ref >60)
Glucose, Bld: 134 mg/dL — ABNORMAL HIGH (ref 70–99)
Potassium: 3.8 mmol/L (ref 3.5–5.1)
Sodium: 144 mmol/L (ref 135–145)
Total Bilirubin: 0.2 mg/dL — ABNORMAL LOW (ref 0.3–1.2)
Total Protein: 6.2 g/dL — ABNORMAL LOW (ref 6.5–8.1)

## 2020-01-19 LAB — MAGNESIUM: Magnesium: 1.4 mg/dL — CL (ref 1.7–2.4)

## 2020-01-19 LAB — PREPARE RBC (CROSSMATCH)

## 2020-01-19 NOTE — Telephone Encounter (Signed)
Critical Mag result received of 1.4. Cassie H PA aware and to remind patient to take Mag 1 tab TID. Contacted patient and left message with above instructions.

## 2020-01-20 ENCOUNTER — Other Ambulatory Visit: Payer: Self-pay

## 2020-01-20 ENCOUNTER — Inpatient Hospital Stay: Payer: PPO

## 2020-01-20 ENCOUNTER — Encounter: Payer: PPO | Admitting: Thoracic Surgery (Cardiothoracic Vascular Surgery)

## 2020-01-20 ENCOUNTER — Telehealth: Payer: Self-pay

## 2020-01-20 DIAGNOSIS — C3432 Malignant neoplasm of lower lobe, left bronchus or lung: Secondary | ICD-10-CM | POA: Diagnosis not present

## 2020-01-20 DIAGNOSIS — D649 Anemia, unspecified: Secondary | ICD-10-CM

## 2020-01-20 MED ORDER — SODIUM CHLORIDE 0.9% IV SOLUTION
250.0000 mL | Freq: Once | INTRAVENOUS | Status: AC
  Administered 2020-01-20: 250 mL via INTRAVENOUS
  Filled 2020-01-20: qty 250

## 2020-01-20 MED ORDER — DIPHENHYDRAMINE HCL 25 MG PO CAPS
25.0000 mg | ORAL_CAPSULE | Freq: Once | ORAL | Status: AC
  Administered 2020-01-20: 25 mg via ORAL

## 2020-01-20 MED ORDER — DIPHENHYDRAMINE HCL 25 MG PO CAPS
ORAL_CAPSULE | ORAL | Status: AC
  Filled 2020-01-20: qty 1

## 2020-01-20 MED ORDER — SODIUM CHLORIDE 0.9% IV SOLUTION
250.0000 mL | Freq: Once | INTRAVENOUS | Status: DC
  Filled 2020-01-20: qty 250

## 2020-01-20 MED ORDER — ACETAMINOPHEN 325 MG PO TABS
650.0000 mg | ORAL_TABLET | Freq: Once | ORAL | Status: AC
  Administered 2020-01-20: 650 mg via ORAL

## 2020-01-20 MED ORDER — ACETAMINOPHEN 325 MG PO TABS
ORAL_TABLET | ORAL | Status: AC
  Filled 2020-01-20: qty 2

## 2020-01-20 NOTE — Telephone Encounter (Signed)
Called Dr. Leonarda Salon office at [facility] to reschedule pt's appt for today due to pt being in infusion all day today. Appt rescheduled to 02/03/20 at 2 pm. Infusion nurse to inform pt of new appt date and time.

## 2020-01-20 NOTE — Patient Instructions (Signed)
**Please take magnesium oxide three times a day until your level is back to normal and Dr. [name] tells you to resume once a day regimen**  Platelet Transfusion A platelet transfusion is a procedure in which you receive donated platelets through an IV. Platelets are tiny pieces of blood cells. When you get an injury, platelets clump together in the area to form a blood clot. This helps stop bleeding and is the beginning of the healing process. If you have too few platelets, your blood may have trouble clotting. This may cause you to bleed and bruise very easily. You may need a platelet transfusion if you have a condition that causes a low number of platelets (thrombocytopenia). A platelet transfusion may be used to stop or prevent excessive bleeding. Tell a health care provider about:  Any reactions you have had during previous transfusions.  Any allergies you have.  All medicines you are taking, including vitamins, herbs, eye drops, creams, and over-the-counter medicines.  Any blood disorders you have.  Any surgeries you have had.  Any medical conditions you have.  Whether you are pregnant or may be pregnant. What are the risks? Generally, this is a safe procedure. However, problems may occur, including:  Fever.  Infection.  Allergic reaction to the donor platelets.  Your body's disease-fighting system (immune system) attacking the donor platelets (hemolytic reaction). This is rare.  A rare reaction that causes lung damage (transfusion-related acute lung injury). What happens before the procedure? Medicines  Ask your health care provider about: ? Changing or stopping your regular medicines. This is especially important if you are taking diabetes medicines or blood thinners. ? Taking medicines such as aspirin and ibuprofen. These medicines can thin your blood. Do not take these medicines unless your health care provider tells you to take them. ? Taking over-the-counter  medicines, vitamins, herbs, and supplements. General instructions  You will have a blood test to determine your blood type. Your blood type determines what kind of platelets you will be given.  Follow instructions from your health care provider about eating or drinking restrictions.  If you have had an allergic reaction to a transfusion in the past, you may be given medicine to help prevent a reaction.  Your temperature, blood pressure, pulse, and breathing will be monitored. What happens during the procedure?   An IV will be inserted into one of your veins.  For your safety, two health care providers will verify your identity along with the donor platelets about to be infused.  A bag of donor platelets will be connected to your IV. The platelets will flow into your bloodstream. This usually takes 30-60 minutes.  Your temperature, blood pressure, pulse, and breathing will be monitored during the transfusion. This helps detect early signs of any reaction.  You will also be monitored for other symptoms that may indicate a reaction, including chills, hives, or itching.  If you have signs of a reaction at any time, your transfusion will be stopped, and you may be given medicine to help manage the reaction.  When your transfusion is complete, your IV will be removed.  Pressure may be applied to the IV site for a few minutes to stop any bleeding.  The IV site will be covered with a bandage (dressing). The procedure may vary among health care providers and hospitals. What happens after the procedure?  Your blood pressure, temperature, pulse, and breathing will be monitored until you leave the hospital or clinic.  You may have some  bruising and soreness at your IV site. Follow these instructions at home: Medicines  Take over-the-counter and prescription medicines only as told by your health care provider.  Talk with your health care provider before you take any medicines that contain  aspirin or NSAIDs. These medicines increase your risk for dangerous bleeding. General instructions  Change or remove your dressing as told by your health care provider.  Return to your normal activities as told by your health care provider. Ask your health care provider what activities are safe for you.  Do not take baths, swim, or use a hot tub until your health care provider approves. Ask your health care provider if you may take showers.  Check your IV site every day for signs of infection. Check for: ? Redness, swelling, or pain. ? Fluid or blood. If fluid or blood drains from your IV site, use your hands to press down firmly on a bandage covering the area for a minute or two. Doing this should stop the bleeding. ? Warmth. ? Pus or a bad smell.  Keep all follow-up visits as told by your health care provider. This is important. Contact a health care provider if you have:  A headache that does not go away with medicine.  Hives, rash, or itchy skin.  Nausea or vomiting.  Unusual tiredness or weakness.  Signs of infection at your IV site. Get help right away if:  You have a fever or chills.  You urinate less often than usual.  Your urine is darker colored than normal.  You have any of the following: ? Trouble breathing. ? Pain in your back, abdomen, or chest. ? Cool, clammy skin. ? A fast heartbeat. Summary  Platelets are tiny pieces of blood cells that clump together to form a blood clot when you have an injury. If you have too few platelets, your blood may have trouble clotting.  A platelet transfusion is a procedure in which you receive donated platelets through an IV.  A platelet transfusion may be used to stop or prevent excessive bleeding.  After the procedure, check your IV site every day for signs of infection, including redness, swelling, pain, or warmth. This information is not intended to replace advice given to you by your health care provider. Make sure  you discuss any questions you have with your health care provider. Document Revised: 11/28/2017 Document Reviewed: 11/28/2017 Elsevier Patient Education  2020 [place].  Blood Transfusion, Adult, Care After This sheet gives you information about how to care for yourself after your procedure. Your doctor may also give you more specific instructions. If you have problems or questions, contact your doctor. What can I expect after the procedure? After the procedure, it is common to have:  Bruising and soreness at the IV site.  A fever or chills on the day of the procedure. This may be your body's response to the new blood cells received.  A headache. Follow these instructions at home: Insertion site care      Follow instructions from your doctor about how to take care of your insertion site. This is where an IV tube was put into your vein. Make sure you: ? Wash your hands with soap and water before and after you change your bandage (dressing). If you cannot use soap and water, use hand sanitizer. ? Change your bandage as told by your doctor.  Check your insertion site every day for signs of infection. Check for: ? Redness, swelling, or pain. ? Bleeding  from the site. ? Warmth. ? Pus or a bad smell. General instructions  Take over-the-counter and prescription medicines only as told by your doctor.  Rest as told by your doctor.  Go back to your normal activities as told by your doctor.  Keep all follow-up visits as told by your doctor. This is important. Contact a doctor if:  You have itching or red, swollen areas of skin (hives).  You feel worried or nervous (anxious).  You feel weak after doing your normal activities.  You have redness, swelling, warmth, or pain around the insertion site.  You have blood coming from the insertion site, and the blood does not stop with pressure.  You have pus or a bad smell coming from the insertion site. Get help right away  if:  You have signs of a serious reaction. This may be coming from an allergy or the body's defense system (immune system). Signs include: ? Trouble breathing or shortness of breath. ? Swelling of the face or feeling warm (flushed). ? Fever or chills. ? Head, chest, or back pain. ? Dark pee (urine) or blood in the pee. ? Widespread rash. ? Fast heartbeat. ? Feeling dizzy or light-headed. You may receive your blood transfusion in an outpatient setting. If so, you will be told whom to contact to report any reactions. These symptoms may be an emergency. Do not wait to see if the symptoms will go away. Get medical help right away. Call your local emergency services (911 in the [place] Do not drive yourself to the hospital. Summary  Bruising and soreness at the IV site are common.  Check your insertion site every day for signs of infection.  Rest as told by your doctor. Go back to your normal activities as told by your doctor.  Get help right away if you have signs of a serious reaction. This information is not intended to replace advice given to you by your health care provider. Make sure you discuss any questions you have with your health care provider. Document Revised: 04/17/2019 Document Reviewed: 04/17/2019 Elsevier Patient Education  [place].

## 2020-01-21 LAB — PREPARE PLATELET PHERESIS: Unit division: 0

## 2020-01-21 LAB — BPAM RBC
Blood Product Expiration Date: 202104152359
Blood Product Expiration Date: 202104152359
ISSUE DATE / TIME: 202103160944
ISSUE DATE / TIME: 202103160944
Unit Type and Rh: 5100
Unit Type and Rh: 5100

## 2020-01-21 LAB — BPAM PLATELET PHERESIS
Blood Product Expiration Date: 202103172359
ISSUE DATE / TIME: 202103160947
Unit Type and Rh: 7300

## 2020-01-21 LAB — TYPE AND SCREEN
ABO/RH(D): O POS
Antibody Screen: NEGATIVE
Unit division: 0
Unit division: 0

## 2020-01-23 ENCOUNTER — Other Ambulatory Visit: Payer: Self-pay | Admitting: Physician Assistant

## 2020-01-23 ENCOUNTER — Encounter: Payer: Self-pay | Admitting: Internal Medicine

## 2020-01-23 ENCOUNTER — Telehealth: Payer: Self-pay | Admitting: Medical Oncology

## 2020-01-23 DIAGNOSIS — C3432 Malignant neoplasm of lower lobe, left bronchus or lung: Secondary | ICD-10-CM | POA: Diagnosis not present

## 2020-01-23 DIAGNOSIS — C3492 Malignant neoplasm of unspecified part of left bronchus or lung: Secondary | ICD-10-CM

## 2020-01-23 NOTE — Telephone Encounter (Signed)
LVM to continue collecting stool samples ad keep appt on monday

## 2020-01-24 DIAGNOSIS — C3432 Malignant neoplasm of lower lobe, left bronchus or lung: Secondary | ICD-10-CM | POA: Diagnosis not present

## 2020-01-26 ENCOUNTER — Other Ambulatory Visit: Payer: Self-pay

## 2020-01-26 ENCOUNTER — Inpatient Hospital Stay: Payer: PPO

## 2020-01-26 DIAGNOSIS — C3432 Malignant neoplasm of lower lobe, left bronchus or lung: Secondary | ICD-10-CM | POA: Diagnosis not present

## 2020-01-26 DIAGNOSIS — C3492 Malignant neoplasm of unspecified part of left bronchus or lung: Secondary | ICD-10-CM

## 2020-01-26 LAB — CBC WITH DIFFERENTIAL (CANCER CENTER ONLY)
Abs Immature Granulocytes: 0.07 10*3/uL (ref 0.00–0.07)
Basophils Absolute: 0 10*3/uL (ref 0.0–0.1)
Basophils Relative: 0 %
Eosinophils Absolute: 0 10*3/uL (ref 0.0–0.5)
Eosinophils Relative: 0 %
HCT: 28.7 % — ABNORMAL LOW (ref 36.0–46.0)
Hemoglobin: 9.4 g/dL — ABNORMAL LOW (ref 12.0–15.0)
Immature Granulocytes: 1 %
Lymphocytes Relative: 12 %
Lymphs Abs: 1.3 10*3/uL (ref 0.7–4.0)
MCH: 31.3 pg (ref 26.0–34.0)
MCHC: 32.8 g/dL (ref 30.0–36.0)
MCV: 95.7 fL (ref 80.0–100.0)
Monocytes Absolute: 1.5 10*3/uL — ABNORMAL HIGH (ref 0.1–1.0)
Monocytes Relative: 14 %
Neutro Abs: 7.8 10*3/uL — ABNORMAL HIGH (ref 1.7–7.7)
Neutrophils Relative %: 73 %
Platelet Count: 145 10*3/uL — ABNORMAL LOW (ref 150–400)
RBC: 3 MIL/uL — ABNORMAL LOW (ref 3.87–5.11)
RDW: 15.6 % — ABNORMAL HIGH (ref 11.5–15.5)
WBC Count: 10.6 10*3/uL — ABNORMAL HIGH (ref 4.0–10.5)
nRBC: 0 % (ref 0.0–0.2)

## 2020-01-26 LAB — CMP (CANCER CENTER ONLY)
ALT: <6 U/L (ref 0–44)
AST: 8 U/L — ABNORMAL LOW (ref 15–41)
Albumin: 3.4 g/dL — ABNORMAL LOW (ref 3.5–5.0)
Alkaline Phosphatase: 168 U/L — ABNORMAL HIGH (ref 38–126)
Anion gap: 12 (ref 5–15)
BUN: 11 mg/dL (ref 8–23)
CO2: 25 mmol/L (ref 22–32)
Calcium: 9.2 mg/dL (ref 8.9–10.3)
Chloride: 104 mmol/L (ref 98–111)
Creatinine: 0.93 mg/dL (ref 0.44–1.00)
GFR, Est AFR Am: >60 mL/min (ref >60)
GFR, Estimated: >60 mL/min (ref >60)
Glucose, Bld: 101 mg/dL — ABNORMAL HIGH (ref 70–99)
Potassium: 4.1 mmol/L (ref 3.5–5.1)
Sodium: 141 mmol/L (ref 135–145)
Total Bilirubin: 0.4 mg/dL (ref 0.3–1.2)
Total Protein: 6.5 g/dL (ref 6.5–8.1)

## 2020-01-26 LAB — OCCULT BLOOD X 1 CARD TO LAB, STOOL
Fecal Occult Bld: NEGATIVE
Fecal Occult Bld: NEGATIVE
Fecal Occult Bld: NEGATIVE

## 2020-01-26 LAB — MAGNESIUM: Magnesium: 1.6 mg/dL — ABNORMAL LOW (ref 1.7–2.4)

## 2020-01-26 LAB — SAMPLE TO BLOOD BANK

## 2020-01-27 ENCOUNTER — Telehealth: Payer: Self-pay | Admitting: Physician Assistant

## 2020-01-27 ENCOUNTER — Telehealth: Payer: Self-pay | Admitting: *Deleted

## 2020-01-27 NOTE — Telephone Encounter (Signed)
-  Called the patient to discuss the results of her stool cards. Left a voicemail for her to return our call to discuss the results.

## 2020-01-27 NOTE — Telephone Encounter (Signed)
Spoke with patient.  Relayed information about her stool card being negative and to follow up with PCP if bleeding reoccurs.  Denies any further questions.

## 2020-02-02 ENCOUNTER — Other Ambulatory Visit: Payer: Self-pay | Admitting: Physician Assistant

## 2020-02-02 DIAGNOSIS — D649 Anemia, unspecified: Secondary | ICD-10-CM

## 2020-02-02 DIAGNOSIS — C3492 Malignant neoplasm of unspecified part of left bronchus or lung: Secondary | ICD-10-CM

## 2020-02-03 ENCOUNTER — Encounter: Payer: PPO | Admitting: Thoracic Surgery (Cardiothoracic Vascular Surgery)

## 2020-02-04 ENCOUNTER — Encounter: Payer: Self-pay | Admitting: Medical Oncology

## 2020-02-04 ENCOUNTER — Encounter: Payer: Self-pay | Admitting: Internal Medicine

## 2020-02-06 ENCOUNTER — Other Ambulatory Visit: Payer: Self-pay | Admitting: Physician Assistant

## 2020-02-06 ENCOUNTER — Other Ambulatory Visit: Payer: Self-pay

## 2020-02-06 ENCOUNTER — Telehealth: Payer: Self-pay | Admitting: Physician Assistant

## 2020-02-06 ENCOUNTER — Inpatient Hospital Stay: Payer: PPO | Attending: Internal Medicine

## 2020-02-06 ENCOUNTER — Ambulatory Visit (HOSPITAL_COMMUNITY)
Admission: RE | Admit: 2020-02-06 | Discharge: 2020-02-06 | Disposition: A | Discharge status: Home / Self Care | Payer: PPO | Source: Ambulatory Visit | Attending: Internal Medicine | Admitting: Internal Medicine

## 2020-02-06 DIAGNOSIS — C3492 Malignant neoplasm of unspecified part of left bronchus or lung: Secondary | ICD-10-CM

## 2020-02-06 DIAGNOSIS — Z79899 Other long term (current) drug therapy: Secondary | ICD-10-CM | POA: Insufficient documentation

## 2020-02-06 DIAGNOSIS — E039 Hypothyroidism, unspecified: Secondary | ICD-10-CM | POA: Insufficient documentation

## 2020-02-06 DIAGNOSIS — Z791 Long term (current) use of non-steroidal anti-inflammatories (NSAID): Secondary | ICD-10-CM | POA: Insufficient documentation

## 2020-02-06 DIAGNOSIS — E7800 Pure hypercholesterolemia, unspecified: Secondary | ICD-10-CM | POA: Insufficient documentation

## 2020-02-06 DIAGNOSIS — F419 Anxiety disorder, unspecified: Secondary | ICD-10-CM | POA: Diagnosis not present

## 2020-02-06 DIAGNOSIS — F329 Major depressive disorder, single episode, unspecified: Secondary | ICD-10-CM | POA: Insufficient documentation

## 2020-02-06 DIAGNOSIS — J449 Chronic obstructive pulmonary disease, unspecified: Secondary | ICD-10-CM | POA: Insufficient documentation

## 2020-02-06 DIAGNOSIS — C3432 Malignant neoplasm of lower lobe, left bronchus or lung: Secondary | ICD-10-CM | POA: Insufficient documentation

## 2020-02-06 DIAGNOSIS — G2581 Restless legs syndrome: Secondary | ICD-10-CM | POA: Insufficient documentation

## 2020-02-06 DIAGNOSIS — Z5111 Encounter for antineoplastic chemotherapy: Secondary | ICD-10-CM | POA: Diagnosis not present

## 2020-02-06 DIAGNOSIS — J45909 Unspecified asthma, uncomplicated: Secondary | ICD-10-CM | POA: Insufficient documentation

## 2020-02-06 DIAGNOSIS — I2510 Atherosclerotic heart disease of native coronary artery without angina pectoris: Secondary | ICD-10-CM | POA: Diagnosis not present

## 2020-02-06 LAB — CBC WITH DIFFERENTIAL (CANCER CENTER ONLY)
Abs Immature Granulocytes: 0.02 10*3/uL (ref 0.00–0.07)
Basophils Absolute: 0 10*3/uL (ref 0.0–0.1)
Basophils Relative: 1 %
Eosinophils Absolute: 0 10*3/uL (ref 0.0–0.5)
Eosinophils Relative: 0 %
HCT: 30.4 % — ABNORMAL LOW (ref 36.0–46.0)
Hemoglobin: 9.6 g/dL — ABNORMAL LOW (ref 12.0–15.0)
Immature Granulocytes: 0 %
Lymphocytes Relative: 25 %
Lymphs Abs: 1.5 10*3/uL (ref 0.7–4.0)
MCH: 31.1 pg (ref 26.0–34.0)
MCHC: 31.6 g/dL (ref 30.0–36.0)
MCV: 98.4 fL (ref 80.0–100.0)
Monocytes Absolute: 0.5 10*3/uL (ref 0.1–1.0)
Monocytes Relative: 8 %
Neutro Abs: 3.9 10*3/uL (ref 1.7–7.7)
Neutrophils Relative %: 66 %
Platelet Count: 249 10*3/uL (ref 150–400)
RBC: 3.09 MIL/uL — ABNORMAL LOW (ref 3.87–5.11)
RDW: 16.5 % — ABNORMAL HIGH (ref 11.5–15.5)
WBC Count: 5.9 10*3/uL (ref 4.0–10.5)
nRBC: 0 % (ref 0.0–0.2)

## 2020-02-06 LAB — CMP (CANCER CENTER ONLY)
ALT: 7 U/L (ref 0–44)
AST: 11 U/L — ABNORMAL LOW (ref 15–41)
Albumin: 3.5 g/dL (ref 3.5–5.0)
Alkaline Phosphatase: 188 U/L — ABNORMAL HIGH (ref 38–126)
Anion gap: 12 (ref 5–15)
BUN: 16 mg/dL (ref 8–23)
CO2: 26 mmol/L (ref 22–32)
Calcium: 9.5 mg/dL (ref 8.9–10.3)
Chloride: 105 mmol/L (ref 98–111)
Creatinine: 0.99 mg/dL (ref 0.44–1.00)
GFR, Est AFR Am: >60 mL/min (ref >60)
GFR, Estimated: >60 mL/min (ref >60)
Glucose, Bld: 102 mg/dL — ABNORMAL HIGH (ref 70–99)
Potassium: 4.5 mmol/L (ref 3.5–5.1)
Sodium: 143 mmol/L (ref 135–145)
Total Bilirubin: 0.3 mg/dL (ref 0.3–1.2)
Total Protein: 7 g/dL (ref 6.5–8.1)

## 2020-02-06 MED ORDER — SODIUM CHLORIDE (PF) 0.9 % IJ SOLN
INTRAMUSCULAR | Status: AC
  Filled 2020-02-06: qty 50

## 2020-02-06 MED ORDER — IOHEXOL 300 MG/ML  SOLN
75.0000 mL | Freq: Once | INTRAMUSCULAR | Status: AC | PRN
  Administered 2020-02-06: 75 mL via INTRAVENOUS

## 2020-02-06 NOTE — Telephone Encounter (Signed)
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 She is asymptomatic. Also discussed with radiologist. I will reach out to her cardiothoracic surgeon to see if this is something that he can manage or refer urgently to vascular surgery. Discussed signs and symptoms that warrant emergency room evaluation such as chest pain, shortness of breath, back pain, syncope, lightheadeness, etc.

## 2020-02-09 ENCOUNTER — Encounter: Payer: Self-pay | Admitting: Thoracic Surgery (Cardiothoracic Vascular Surgery)

## 2020-02-09 ENCOUNTER — Encounter: Payer: Self-pay | Admitting: Internal Medicine

## 2020-02-10 ENCOUNTER — Inpatient Hospital Stay: Payer: PPO | Admitting: Internal Medicine

## 2020-02-10 ENCOUNTER — Other Ambulatory Visit: Payer: Self-pay

## 2020-02-10 ENCOUNTER — Encounter: Payer: Self-pay | Admitting: Internal Medicine

## 2020-02-10 ENCOUNTER — Telehealth: Payer: Self-pay | Admitting: Internal Medicine

## 2020-02-10 VITALS — BP 134/88 | HR 116 | Temp 98.0°F | Resp 17 | Ht 65.0 in | Wt 176.0 lb

## 2020-02-10 DIAGNOSIS — C3432 Malignant neoplasm of lower lobe, left bronchus or lung: Secondary | ICD-10-CM | POA: Diagnosis not present

## 2020-02-10 DIAGNOSIS — C3492 Malignant neoplasm of unspecified part of left bronchus or lung: Secondary | ICD-10-CM

## 2020-02-10 NOTE — Patient Instructions (Signed)
Steps to Quit Smoking Smoking tobacco is the leading cause of preventable death. It can affect almost every organ in the body. Smoking puts you and people around you at risk for many serious, long-lasting (chronic) diseases. Quitting smoking can be hard, but it is one of the best things that you can do for your health. It is never too late to quit. How do I get ready to quit? When you decide to quit smoking, make a plan to help you succeed. Before you quit:  Pick a date to quit. Set a date within the next 2 weeks to give you time to prepare.  Write down the reasons why you are quitting. Keep this list in places where you will see it often.  Tell your family, friends, and co-workers that you are quitting. Their support is important.  Talk with your doctor about the choices that may help you quit.  Find out if your health insurance will pay for these treatments.  Know the people, places, things, and activities that make you want to smoke (triggers). Avoid them. What first steps can I take to quit smoking?  Throw away all cigarettes at home, at work, and in your car.  Throw away the things that you use when you smoke, such as ashtrays and lighters.  Clean your car. Make sure to empty the ashtray.  Clean your home, including curtains and carpets. What can I do to help me quit smoking? Talk with your doctor about taking medicines and seeing a counselor at the same time. You are more likely to succeed when you do both.  If you are pregnant or breastfeeding, talk with your doctor about counseling or other ways to quit smoking. Do not take medicine to help you quit smoking unless your doctor tells you to do so. To quit smoking: Quit right away  Quit smoking totally, instead of slowly cutting back on how much you smoke over a period of time.  Go to counseling. You are more likely to quit if you go to counseling sessions regularly. Take medicine You may take medicines to help you quit. Some  medicines need a prescription, and some you can buy over-the-counter. Some medicines may contain a drug called nicotine to replace the nicotine in cigarettes. Medicines may:  Help you to stop having the desire to smoke (cravings).  Help to stop the problems that come when you stop smoking (withdrawal symptoms). Your doctor may ask you to use:  Nicotine patches, gum, or lozenges.  Nicotine inhalers or sprays.  Non-nicotine medicine that is taken by mouth. Find resources Find resources and other ways to help you quit smoking and remain smoke-free after you quit. These resources are most helpful when you use them often. They include:  Online chats with a counselor.  Phone quitlines.  Printed self-help materials.  Support groups or group counseling.  Text messaging programs.  Mobile phone apps. Use apps on your mobile phone or tablet that can help you stick to your quit plan. There are many free apps for mobile phones and tablets as well as websites. Examples include Quit Guide from the CDC and smokefree.gov  What things can I do to make it easier to quit?   Talk to your family and friends. Ask them to support and encourage you.  Call a phone quitline (1-800-QUIT-NOW), reach out to support groups, or work with a counselor.  Ask people who smoke to not smoke around you.  Avoid places that make you want to smoke,   such as: ? Bars. ? Parties. ? Smoke-break areas at work.  Spend time with people who do not smoke.  Lower the stress in your life. Stress can make you want to smoke. Try these things to help your stress: ? Getting regular exercise. ? Doing deep-breathing exercises. ? Doing yoga. ? Meditating. ? Doing a body scan. To do this, close your eyes, focus on one area of your body at a time from head to toe. Notice which parts of your body are tense. Try to relax the muscles in those areas. How will I feel when I quit smoking? Day 1 to 3 weeks Within the first 24 hours,  you may start to have some problems that come from quitting tobacco. These problems are very bad 2-3 days after you quit, but they do not often last for more than 2-3 weeks. You may get these symptoms:  Mood swings.  Feeling restless, nervous, angry, or annoyed.  Trouble concentrating.  Dizziness.  Strong desire for high-sugar foods and nicotine.  Weight gain.  Trouble pooping (constipation).  Feeling like you may vomit (nausea).  Coughing or a sore throat.  Changes in how the medicines that you take for other issues work in your body.  Depression.  Trouble sleeping (insomnia). Week 3 and afterward After the first 2-3 weeks of quitting, you may start to notice more positive results, such as:  Better sense of smell and taste.  Less coughing and sore throat.  Slower heart rate.  Lower blood pressure.  Clearer skin.  Better breathing.  Fewer sick days. Quitting smoking can be hard. Do not give up if you fail the first time. Some people need to try a few times before they succeed. Do your best to stick to your quit plan, and talk with your doctor if you have any questions or concerns. Summary  Smoking tobacco is the leading cause of preventable death. Quitting smoking can be hard, but it is one of the best things that you can do for your health.  When you decide to quit smoking, make a plan to help you succeed.  Quit smoking right away, not slowly over a period of time.  When you start quitting, seek help from your doctor, family, or friends. This information is not intended to replace advice given to you by your health care provider. Make sure you discuss any questions you have with your health care provider. Document Revised: 07/18/2019 Document Reviewed: 01/11/2019 Elsevier Patient Education  2020 [employer]  
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
## 2020-02-10 NOTE — Telephone Encounter (Signed)
Scheduled appts per 4/6 los. Left voicemail with new appt details.

## 2020-02-13 ENCOUNTER — Ambulatory Visit: Payer: PPO | Attending: Internal Medicine

## 2020-02-13 DIAGNOSIS — Z23 Encounter for immunization: Secondary | ICD-10-CM

## 2020-02-13 NOTE — Progress Notes (Signed)
   Covid-19 Vaccination Clinic  Name:  RIHANA KIDDY    MRN: [id number] DOB: 1956/01/22  02/13/2020  Ms. Fitzsimmons was observed post Covid-19 immunization for 15 minutes without incident. She was provided with Vaccine Information Sheet and instruction to access the V-Safe system.   Ms. Damron was instructed to call 911 with any severe reactions post vaccine: Marland Kitchen Difficulty breathing  . Swelling of face and throat  . A fast heartbeat  . A bad rash all over body  . Dizziness and weakness   Immunizations Administered    Name Date Dose VIS Date Route   Pfizer COVID-19 Vaccine 02/13/2020  1:56 PM 0.3 mL 10/17/2019 Intramuscular   Manufacturer: [place]   Lot: [id number]   [place]: [id number]

## 2020-02-17 ENCOUNTER — Other Ambulatory Visit: Payer: Self-pay

## 2020-02-17 ENCOUNTER — Ambulatory Visit: Payer: PPO | Admitting: Thoracic Surgery (Cardiothoracic Vascular Surgery)

## 2020-02-17 ENCOUNTER — Encounter: Payer: Self-pay | Admitting: Thoracic Surgery (Cardiothoracic Vascular Surgery)

## 2020-02-17 VITALS — BP 128/86 | HR 108 | Temp 97.3°F | Resp 20 | Ht 65.0 in | Wt 176.0 lb

## 2020-02-17 DIAGNOSIS — C3492 Malignant neoplasm of unspecified part of left bronchus or lung: Secondary | ICD-10-CM | POA: Diagnosis not present

## 2020-02-17 DIAGNOSIS — I700 Atherosclerosis of aorta: Secondary | ICD-10-CM | POA: Diagnosis not present
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
## 2020-02-18 ENCOUNTER — Encounter: Payer: Self-pay | Admitting: Internal Medicine

## 2020-02-27 ENCOUNTER — Encounter: Payer: Self-pay | Admitting: Internal Medicine

## 2020-03-08 ENCOUNTER — Ambulatory Visit: Payer: PPO | Attending: Internal Medicine

## 2020-03-08 DIAGNOSIS — Z23 Encounter for immunization: Secondary | ICD-10-CM

## 2020-03-08 NOTE — Progress Notes (Signed)
   Covid-19 Vaccination Clinic  Name:  Andrea Kline    MRN: [id number] DOB: Aug 06, 1956  03/08/2020  Andrea Kline was observed post Covid-19 immunization for 15 minutes without incident. She was provided with Vaccine Information Sheet and instruction to access the V-Safe system.   Andrea Kline was instructed to call 911 with any severe reactions post vaccine: Marland Kitchen Difficulty breathing  . Swelling of face and throat  . A fast heartbeat  . A bad rash all over body  . Dizziness and weakness   Immunizations Administered    Name Date Dose VIS Date Route   Pfizer COVID-19 Vaccine 03/08/2020  2:41 PM 0.3 mL 12/31/2018 Intramuscular   Manufacturer: [place]   Lot: [id number]   [place]: [id number]

## 2020-03-12 DIAGNOSIS — F17210 Nicotine dependence, cigarettes, uncomplicated: Secondary | ICD-10-CM | POA: Diagnosis not present

## 2020-03-12 DIAGNOSIS — F4310 Post-traumatic stress disorder, unspecified: Secondary | ICD-10-CM | POA: Diagnosis not present

## 2020-03-12 DIAGNOSIS — K9089 Other intestinal malabsorption: Secondary | ICD-10-CM | POA: Diagnosis not present

## 2020-03-12 DIAGNOSIS — I700 Atherosclerosis of aorta: Secondary | ICD-10-CM | POA: Diagnosis not present

## 2020-03-12 DIAGNOSIS — C3490 Malignant neoplasm of unspecified part of unspecified bronchus or lung: Secondary | ICD-10-CM | POA: Diagnosis not present

## 2020-03-12 DIAGNOSIS — H8113 Benign paroxysmal vertigo, bilateral: Secondary | ICD-10-CM | POA: Diagnosis not present

## 2020-03-12 DIAGNOSIS — E7800 Pure hypercholesterolemia, unspecified: Secondary | ICD-10-CM | POA: Diagnosis not present

## 2020-03-12 DIAGNOSIS — G2581 Restless legs syndrome: Secondary | ICD-10-CM | POA: Diagnosis not present

## 2020-03-12 DIAGNOSIS — Z79899 Other long term (current) drug therapy: Secondary | ICD-10-CM | POA: Diagnosis not present

## 2020-03-12 DIAGNOSIS — E039 Hypothyroidism, unspecified: Secondary | ICD-10-CM | POA: Diagnosis not present

## 2020-03-12 DIAGNOSIS — L821 Other seborrheic keratosis: Secondary | ICD-10-CM | POA: Diagnosis not present

## 2020-03-12 DIAGNOSIS — Z0000 Encounter for general adult medical examination without abnormal findings: Secondary | ICD-10-CM | POA: Diagnosis not present

## 2020-03-23 ENCOUNTER — Encounter: Payer: Self-pay | Admitting: Internal Medicine

## 2020-03-26 ENCOUNTER — Telehealth: Payer: Self-pay | Admitting: *Deleted

## 2020-03-26 ENCOUNTER — Other Ambulatory Visit: Payer: Self-pay | Admitting: *Deleted

## 2020-03-26 MED ORDER — MAGNESIUM OXIDE 400 MG PO TABS: 1.0000 | ORAL_TABLET | Freq: Every day | ORAL | 1 refills | Status: DC

## 2020-03-26 NOTE — Telephone Encounter (Signed)
Received call from patient to clarify whether she needs to take magnesium supplement or not. Spoke with [place]. Pt does not need to take Magnesium at this time.  Pt voiced understanding. Call made to [facility] to cancel prescription.

## 2020-04-19 ENCOUNTER — Encounter: Payer: Self-pay | Admitting: Internal Medicine

## 2020-04-22 DIAGNOSIS — H2513 Age-related nuclear cataract, bilateral: Secondary | ICD-10-CM | POA: Diagnosis not present

## 2020-04-28 DIAGNOSIS — E039 Hypothyroidism, unspecified: Secondary | ICD-10-CM | POA: Diagnosis not present

## 2020-04-28 DIAGNOSIS — E7800 Pure hypercholesterolemia, unspecified: Secondary | ICD-10-CM | POA: Diagnosis not present

## 2020-04-28 DIAGNOSIS — C3490 Malignant neoplasm of unspecified part of unspecified bronchus or lung: Secondary | ICD-10-CM | POA: Diagnosis not present

## 2020-04-28 DIAGNOSIS — F325 Major depressive disorder, single episode, in full remission: Secondary | ICD-10-CM | POA: Diagnosis not present

## 2020-05-11 ENCOUNTER — Encounter (HOSPITAL_COMMUNITY): Payer: Self-pay

## 2020-05-11 ENCOUNTER — Inpatient Hospital Stay: Payer: PPO | Attending: Internal Medicine

## 2020-05-11 ENCOUNTER — Other Ambulatory Visit: Payer: Self-pay

## 2020-05-11 ENCOUNTER — Ambulatory Visit (HOSPITAL_COMMUNITY)
Admission: RE | Admit: 2020-05-11 | Discharge: 2020-05-11 | Disposition: A | Discharge status: Home / Self Care | Payer: PPO | Source: Ambulatory Visit | Attending: Physician Assistant | Admitting: Physician Assistant

## 2020-05-11 DIAGNOSIS — Z87442 Personal history of urinary calculi: Secondary | ICD-10-CM | POA: Diagnosis not present

## 2020-05-11 DIAGNOSIS — R0609 Other forms of dyspnea: Secondary | ICD-10-CM | POA: Insufficient documentation

## 2020-05-11 DIAGNOSIS — C3432 Malignant neoplasm of lower lobe, left bronchus or lung: Secondary | ICD-10-CM | POA: Insufficient documentation

## 2020-05-11 DIAGNOSIS — F419 Anxiety disorder, unspecified: Secondary | ICD-10-CM | POA: Diagnosis not present

## 2020-05-11 DIAGNOSIS — J439 Emphysema, unspecified: Secondary | ICD-10-CM | POA: Insufficient documentation

## 2020-05-11 DIAGNOSIS — I700 Atherosclerosis of aorta: Secondary | ICD-10-CM | POA: Diagnosis not present

## 2020-05-11 DIAGNOSIS — I10 Essential (primary) hypertension: Secondary | ICD-10-CM | POA: Insufficient documentation

## 2020-05-11 DIAGNOSIS — C3490 Malignant neoplasm of unspecified part of unspecified bronchus or lung: Secondary | ICD-10-CM | POA: Diagnosis not present

## 2020-05-11 DIAGNOSIS — R112 Nausea with vomiting, unspecified: Secondary | ICD-10-CM | POA: Diagnosis not present

## 2020-05-11 DIAGNOSIS — Z885 Allergy status to narcotic agent status: Secondary | ICD-10-CM | POA: Insufficient documentation

## 2020-05-11 DIAGNOSIS — C3492 Malignant neoplasm of unspecified part of left bronchus or lung: Secondary | ICD-10-CM

## 2020-05-11 DIAGNOSIS — Z79899 Other long term (current) drug therapy: Secondary | ICD-10-CM | POA: Insufficient documentation

## 2020-05-11 DIAGNOSIS — I2510 Atherosclerotic heart disease of native coronary artery without angina pectoris: Secondary | ICD-10-CM | POA: Insufficient documentation

## 2020-05-11 DIAGNOSIS — Z8719 Personal history of other diseases of the digestive system: Secondary | ICD-10-CM | POA: Insufficient documentation

## 2020-05-11 DIAGNOSIS — J432 Centrilobular emphysema: Secondary | ICD-10-CM | POA: Diagnosis not present

## 2020-05-11 DIAGNOSIS — R42 Dizziness and giddiness: Secondary | ICD-10-CM | POA: Insufficient documentation

## 2020-05-11 DIAGNOSIS — M1990 Unspecified osteoarthritis, unspecified site: Secondary | ICD-10-CM | POA: Diagnosis not present

## 2020-05-11 LAB — CMP (CANCER CENTER ONLY)
ALT: 15 U/L (ref 0–44)
AST: 14 U/L — ABNORMAL LOW (ref 15–41)
Albumin: 4.1 g/dL (ref 3.5–5.0)
Alkaline Phosphatase: 154 U/L — ABNORMAL HIGH (ref 38–126)
Anion gap: 12 (ref 5–15)
BUN: 21 mg/dL (ref 8–23)
CO2: 23 mmol/L (ref 22–32)
Calcium: 9.8 mg/dL (ref 8.9–10.3)
Chloride: 108 mmol/L (ref 98–111)
Creatinine: 1.22 mg/dL — ABNORMAL HIGH (ref 0.44–1.00)
GFR, Est AFR Am: 55 mL/min — ABNORMAL LOW (ref >60)
GFR, Estimated: 47 mL/min — ABNORMAL LOW (ref >60)
Glucose, Bld: 99 mg/dL (ref 70–99)
Potassium: 4.2 mmol/L (ref 3.5–5.1)
Sodium: 143 mmol/L (ref 135–145)
Total Bilirubin: 0.4 mg/dL (ref 0.3–1.2)
Total Protein: 7.2 g/dL (ref 6.5–8.1)

## 2020-05-11 LAB — CBC WITH DIFFERENTIAL (CANCER CENTER ONLY)
Abs Immature Granulocytes: 0.01 10*3/uL (ref 0.00–0.07)
Basophils Absolute: 0 10*3/uL (ref 0.0–0.1)
Basophils Relative: 1 %
Eosinophils Absolute: 0 10*3/uL (ref 0.0–0.5)
Eosinophils Relative: 0 %
HCT: 37.2 % (ref 36.0–46.0)
Hemoglobin: 12.3 g/dL (ref 12.0–15.0)
Immature Granulocytes: 0 %
Lymphocytes Relative: 33 %
Lymphs Abs: 1.7 10*3/uL (ref 0.7–4.0)
MCH: 33.5 pg (ref 26.0–34.0)
MCHC: 33.1 g/dL (ref 30.0–36.0)
MCV: 101.4 fL — ABNORMAL HIGH (ref 80.0–100.0)
Monocytes Absolute: 0.4 10*3/uL (ref 0.1–1.0)
Monocytes Relative: 7 %
Neutro Abs: 3 10*3/uL (ref 1.7–7.7)
Neutrophils Relative %: 59 %
Platelet Count: 181 10*3/uL (ref 150–400)
RBC: 3.67 MIL/uL — ABNORMAL LOW (ref 3.87–5.11)
RDW: 12.8 % (ref 11.5–15.5)
WBC Count: 5.1 10*3/uL (ref 4.0–10.5)
nRBC: 0 % (ref 0.0–0.2)

## 2020-05-11 MED ORDER — IOHEXOL 300 MG/ML  SOLN
75.0000 mL | Freq: Once | INTRAMUSCULAR | Status: AC | PRN
  Administered 2020-05-11: 75 mL via INTRAVENOUS

## 2020-05-11 MED ORDER — SODIUM CHLORIDE (PF) 0.9 % IJ SOLN
INTRAMUSCULAR | Status: AC
  Filled 2020-05-11: qty 50
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
## 2020-05-13 ENCOUNTER — Other Ambulatory Visit: Payer: Self-pay

## 2020-05-13 ENCOUNTER — Inpatient Hospital Stay (HOSPITAL_BASED_OUTPATIENT_CLINIC_OR_DEPARTMENT_OTHER): Payer: PPO | Admitting: Physician Assistant

## 2020-05-13 ENCOUNTER — Encounter: Payer: Self-pay | Admitting: Physician Assistant

## 2020-05-13 VITALS — BP 116/85 | HR 107 | Temp 97.1°F | Resp 18 | Ht 65.0 in | Wt 179.2 lb

## 2020-05-13 DIAGNOSIS — C3432 Malignant neoplasm of lower lobe, left bronchus or lung: Secondary | ICD-10-CM | POA: Diagnosis not present

## 2020-05-13 DIAGNOSIS — C3492 Malignant neoplasm of unspecified part of left bronchus or lung: Secondary | ICD-10-CM

## 2020-05-17 ENCOUNTER — Encounter: Payer: Self-pay | Admitting: Internal Medicine

## 2020-05-17 ENCOUNTER — Encounter: Payer: Self-pay | Admitting: Thoracic Surgery (Cardiothoracic Vascular Surgery)

## 2020-05-18 ENCOUNTER — Ambulatory Visit: Payer: PPO | Admitting: Thoracic Surgery (Cardiothoracic Vascular Surgery)

## 2020-05-18 ENCOUNTER — Telehealth: Payer: Self-pay | Admitting: Internal Medicine

## 2020-05-18 NOTE — Telephone Encounter (Signed)
Scheduled appt per 7/13 sch msg - no answer. Left message for patient with appt date and time

## 2020-05-21 ENCOUNTER — Ambulatory Visit (HOSPITAL_COMMUNITY)
Admission: RE | Admit: 2020-05-21 | Discharge: 2020-05-21 | Disposition: A | Discharge status: Home / Self Care | Payer: PPO | Source: Ambulatory Visit | Attending: Physician Assistant | Admitting: Physician Assistant

## 2020-05-21 ENCOUNTER — Other Ambulatory Visit: Payer: Self-pay

## 2020-05-21 DIAGNOSIS — C3490 Malignant neoplasm of unspecified part of unspecified bronchus or lung: Secondary | ICD-10-CM | POA: Diagnosis not present

## 2020-05-21 DIAGNOSIS — I700 Atherosclerosis of aorta: Secondary | ICD-10-CM | POA: Insufficient documentation

## 2020-05-21 DIAGNOSIS — I2510 Atherosclerotic heart disease of native coronary artery without angina pectoris: Secondary | ICD-10-CM | POA: Insufficient documentation

## 2020-05-21 DIAGNOSIS — C3492 Malignant neoplasm of unspecified part of left bronchus or lung: Secondary | ICD-10-CM | POA: Insufficient documentation

## 2020-05-21 DIAGNOSIS — K5730 Diverticulosis of large intestine without perforation or abscess without bleeding: Secondary | ICD-10-CM | POA: Diagnosis not present

## 2020-05-21 LAB — GLUCOSE, CAPILLARY: Glucose-Capillary: 120 mg/dL — ABNORMAL HIGH (ref 70–99)

## 2020-05-21 MED ORDER — FLUDEOXYGLUCOSE F - 18 (FDG) INJECTION
8.6600 | Freq: Once | INTRAVENOUS | Status: AC | PRN
  Administered 2020-05-21: 8.66 via INTRAVENOUS

## 2020-05-24 ENCOUNTER — Other Ambulatory Visit: Payer: Self-pay | Admitting: Physician Assistant

## 2020-05-24 ENCOUNTER — Encounter: Payer: Self-pay | Admitting: Internal Medicine

## 2020-05-24 DIAGNOSIS — F325 Major depressive disorder, single episode, in full remission: Secondary | ICD-10-CM | POA: Diagnosis not present

## 2020-05-24 DIAGNOSIS — C3492 Malignant neoplasm of unspecified part of left bronchus or lung: Secondary | ICD-10-CM

## 2020-05-24 DIAGNOSIS — C3490 Malignant neoplasm of unspecified part of unspecified bronchus or lung: Secondary | ICD-10-CM | POA: Diagnosis not present

## 2020-05-24 DIAGNOSIS — E039 Hypothyroidism, unspecified: Secondary | ICD-10-CM | POA: Diagnosis not present

## 2020-05-24 DIAGNOSIS — E7800 Pure hypercholesterolemia, unspecified: Secondary | ICD-10-CM | POA: Diagnosis not present

## 2020-05-25 ENCOUNTER — Encounter: Payer: Self-pay | Admitting: Thoracic Surgery (Cardiothoracic Vascular Surgery)

## 2020-05-25 ENCOUNTER — Other Ambulatory Visit: Payer: Self-pay

## 2020-05-25 ENCOUNTER — Ambulatory Visit: Payer: PPO | Admitting: Thoracic Surgery (Cardiothoracic Vascular Surgery)

## 2020-05-25 VITALS — BP 130/74 | HR 101 | Temp 97.0°F | Resp 16 | Ht 65.0 in | Wt 179.0 lb

## 2020-05-25 DIAGNOSIS — Z09 Encounter for follow-up examination after completed treatment for conditions other than malignant neoplasm: Secondary | ICD-10-CM

## 2020-05-25 DIAGNOSIS — C3492 Malignant neoplasm of unspecified part of left bronchus or lung: Secondary | ICD-10-CM
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
## 2020-05-26 ENCOUNTER — Encounter: Payer: Self-pay | Admitting: *Deleted

## 2020-05-26 ENCOUNTER — Other Ambulatory Visit: Payer: Self-pay

## 2020-05-26 ENCOUNTER — Other Ambulatory Visit: Payer: Self-pay | Admitting: *Deleted

## 2020-05-26 DIAGNOSIS — R590 Localized enlarged lymph nodes: Secondary | ICD-10-CM

## 2020-05-27 ENCOUNTER — Other Ambulatory Visit: Payer: Self-pay

## 2020-05-27 ENCOUNTER — Encounter: Payer: Self-pay | Admitting: Internal Medicine

## 2020-05-27 ENCOUNTER — Other Ambulatory Visit: Payer: Self-pay | Admitting: *Deleted

## 2020-05-27 ENCOUNTER — Encounter: Payer: Self-pay | Admitting: Thoracic Surgery (Cardiothoracic Vascular Surgery)

## 2020-05-27 ENCOUNTER — Inpatient Hospital Stay: Payer: PPO | Admitting: Internal Medicine

## 2020-05-27 VITALS — BP 136/80 | HR 91 | Temp 97.7°F | Resp 18 | Ht 65.0 in | Wt 177.7 lb

## 2020-05-27 DIAGNOSIS — C3490 Malignant neoplasm of unspecified part of unspecified bronchus or lung: Secondary | ICD-10-CM | POA: Diagnosis not present

## 2020-05-27 DIAGNOSIS — C3492 Malignant neoplasm of unspecified part of left bronchus or lung: Secondary | ICD-10-CM | POA: Diagnosis not present

## 2020-05-27 DIAGNOSIS — I10 Essential (primary) hypertension: Secondary | ICD-10-CM | POA: Diagnosis not present

## 2020-05-27 DIAGNOSIS — F419 Anxiety disorder, unspecified: Secondary | ICD-10-CM

## 2020-05-27 DIAGNOSIS — C3432 Malignant neoplasm of lower lobe, left bronchus or lung: Secondary | ICD-10-CM | POA: Diagnosis not present
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
## 2020-05-28 ENCOUNTER — Telehealth: Payer: Self-pay | Admitting: Internal Medicine

## 2020-05-28 NOTE — Telephone Encounter (Signed)
Scheduled per los. Called and left msg. Mailed printout  °

## 2020-06-02 ENCOUNTER — Encounter: Payer: Self-pay | Admitting: Radiation Oncology

## 2020-06-02 ENCOUNTER — Ambulatory Visit
Admission: RE | Admit: 2020-06-02 | Discharge: 2020-06-02 | Disposition: A | Discharge status: Home / Self Care | Payer: PPO | Source: Ambulatory Visit | Attending: Radiation Oncology | Admitting: Radiation Oncology

## 2020-06-02 ENCOUNTER — Other Ambulatory Visit: Payer: Self-pay | Admitting: Radiation Therapy

## 2020-06-02 ENCOUNTER — Other Ambulatory Visit: Payer: Self-pay

## 2020-06-02 VITALS — BP 118/79 | HR 102 | Resp 19 | Wt 178.1 lb

## 2020-06-02 DIAGNOSIS — C3492 Malignant neoplasm of unspecified part of left bronchus or lung: Secondary | ICD-10-CM

## 2020-06-02 DIAGNOSIS — F17210 Nicotine dependence, cigarettes, uncomplicated: Secondary | ICD-10-CM | POA: Diagnosis not present

## 2020-06-02 DIAGNOSIS — Z716 Tobacco abuse counseling: Secondary | ICD-10-CM | POA: Diagnosis not present

## 2020-06-02 DIAGNOSIS — C771 Secondary and unspecified malignant neoplasm of intrathoracic lymph nodes: Secondary | ICD-10-CM | POA: Diagnosis not present

## 2020-06-02 DIAGNOSIS — C3432 Malignant neoplasm of lower lobe, left bronchus or lung: Secondary | ICD-10-CM | POA: Diagnosis not present

## 2020-06-02 DIAGNOSIS — C7931 Secondary malignant neoplasm of brain: Secondary | ICD-10-CM

## 2020-06-02 NOTE — Progress Notes (Signed)
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  On PET CT this node is markedly hypermetabolic.  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  Biopsies revealed:  (from original diagnosis, nothing collected recently due to invasiveness/high risk of procedure) 08/20/2020 FINAL MICROSCOPIC DIAGNOSIS:  A. LUNG, LEFT LOWER LOBE, WEDGE RESECTION:  - Adenocarcinoma, well differentiated, spanning 1.7 cm.  - The surgical resection margins are negative for carcinoma.  - See oncology table A below  B. 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  - The surgical resection margins are negative for carcinoma.  - See oncology table B below.  C. LYMPH NODE,LEVEL 9 , BIOPSY:  - There is no evidence of carcinoma in 1 of 1 lymph node (0/1).  D. LYMPH NODE, LEVEL 8, BIOPSY:  - There is no evidence of carcinoma in 1 of 1 lymph node (0/1).  E. LYMPH NODE, LEVEL 5, BIOPSY:  - There is no evidence of carcinoma in 1 of 1 lymph node (0/1).  F. LYMPH NODE, LEVEL 6, BIOPSY:  - Benign adipose tissue.  - Lymph nodal tissue is not identified.  G. LYMPH NODE, LEVEL10, BIOPSY:  - There is no evidence of carcinoma in 1 of 1 lymph node (0/1).  H. LYMPH NODE, LEVEL 11, BIOPSY:  - There is no evidence of carcinoma in 1 of 1 lymph node (0/1).  I. LYMPH NODE, LEVEL 11 #2, BIOPSY:  - There is no evidence of carcinoma in 1 of 1 lymph node (0/1).  J. LYMPH NODE, LEVEL 12, BIOPSY:  - There is no evidence of carcinoma in 1 of 1 lymph node (0/1).  K. LUNG, REMAINDER LEFT LINGULAR SEGMENT,  RESECTION:  - Benign lung parenchyma.  There is no evidence of malignancy.   Tobacco/Marijuana/Snuff/ETOH use: Drinks 102 glasses of wine occasionally. Has started smoking cigarettes again "every now and then" but is trying to quit.  Past/Anticipated interventions by cardiothoracic surgery, if any:  Was possibly going to have repeat biopsy done by Dr. Roxan Hockey, but because he would have to redo VATS to obtain patient decided against pursuing.  08/22/2019 Dr. Modesto Charon Left video-assisted thoracoscopy, Wedge resection left lower lobe lung nodule Lingular segmentectomy Lymph node dissection Intercostal nerve blocks levels 3 through 10  Past/Anticipated interventions by medical oncology, if any:  Under care of Dr. Curt Bears 05/27/2020 -The patient completed adjuvant systemic chemotherapy for the small cell lung cancer with cisplatin 80 mg/M2 on day 1 and etoposide 100 mg/M2 on days 1, 2 and 3 with Neulasta support every 3 weeks status post 4 cycles. -I do not think the patient will need any additional chemotherapy at this point but we will reserve this option if she has any evidence of metastatic disease in the future. -I recommended for the patient to see radiation oncology for consideration of curative course of radiotherapy to the local disease recurrence in the mediastinal lymph nodes. -The patient will come back for follow-up visit in around 4 months for evaluation and repeat CT scan of the chest for restaging of her disease  Signs/Symptoms  Weight changes, if any: None  Respiratory complaints, if  any: Patient feels more short of breath and has a persistent dry cough in the evenings. Unable to take full deep breaths   Hemoptysis, if any: None  Pain issues, if any:  Occasional chest discomfort, but states it resolves on its own  SAFETY ISSUES:  Prior radiation? No  Pacemaker/ICD? No  Possible current pregnancy? No  Is the patient on methotrexate?  No  Current Complaints / other details:  Plan for MRI of brain in near future
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
## 2020-06-03 ENCOUNTER — Inpatient Hospital Stay (HOSPITAL_COMMUNITY): Admission: RE | Admit: 2020-06-03 | Payer: PPO | Source: Ambulatory Visit

## 2020-06-03 ENCOUNTER — Other Ambulatory Visit (HOSPITAL_COMMUNITY): Payer: PPO

## 2020-06-05 ENCOUNTER — Encounter: Payer: Self-pay | Admitting: Radiation Oncology

## 2020-06-05 DIAGNOSIS — C771 Secondary and unspecified malignant neoplasm of intrathoracic lymph nodes: Secondary | ICD-10-CM | POA: Insufficient documentation

## 2020-06-07 ENCOUNTER — Inpatient Hospital Stay: Admit: 2020-06-07 | Payer: PPO | Admitting: Thoracic Surgery (Cardiothoracic Vascular Surgery)

## 2020-06-07 ENCOUNTER — Ambulatory Visit
Admission: RE | Admit: 2020-06-07 | Discharge: 2020-06-07 | Disposition: A | Discharge status: Home / Self Care | Payer: PPO | Source: Ambulatory Visit | Attending: Radiation Oncology | Admitting: Radiation Oncology

## 2020-06-07 ENCOUNTER — Other Ambulatory Visit: Payer: Self-pay

## 2020-06-07 DIAGNOSIS — C3432 Malignant neoplasm of lower lobe, left bronchus or lung: Secondary | ICD-10-CM | POA: Diagnosis not present

## 2020-06-07 DIAGNOSIS — C3492 Malignant neoplasm of unspecified part of left bronchus or lung: Secondary | ICD-10-CM | POA: Insufficient documentation

## 2020-06-07 DIAGNOSIS — F17210 Nicotine dependence, cigarettes, uncomplicated: Secondary | ICD-10-CM | POA: Diagnosis not present

## 2020-06-07 DIAGNOSIS — Z510 Encounter for antineoplastic radiation therapy: Secondary | ICD-10-CM | POA: Diagnosis not present

## 2020-06-07 DIAGNOSIS — C771 Secondary and unspecified malignant neoplasm of intrathoracic lymph nodes: Secondary | ICD-10-CM | POA: Insufficient documentation

## 2020-06-07 SURGERY — XI ROBOTIC ASSISTED THORACOSCOPY - DNU
Anesthesia: General | Laterality: Left

## 2020-06-08 DIAGNOSIS — Z510 Encounter for antineoplastic radiation therapy: Secondary | ICD-10-CM | POA: Diagnosis not present

## 2020-06-08 DIAGNOSIS — C3432 Malignant neoplasm of lower lobe, left bronchus or lung: Secondary | ICD-10-CM | POA: Diagnosis not present

## 2020-06-08 DIAGNOSIS — C771 Secondary and unspecified malignant neoplasm of intrathoracic lymph nodes: Secondary | ICD-10-CM | POA: Diagnosis not present

## 2020-06-09 ENCOUNTER — Ambulatory Visit
Admission: RE | Admit: 2020-06-09 | Discharge: 2020-06-09 | Disposition: A | Discharge status: Home / Self Care | Payer: PPO | Source: Ambulatory Visit | Attending: Radiation Oncology | Admitting: Radiation Oncology

## 2020-06-09 DIAGNOSIS — C7949 Secondary malignant neoplasm of other parts of nervous system: Secondary | ICD-10-CM

## 2020-06-09 DIAGNOSIS — Z85118 Personal history of other malignant neoplasm of bronchus and lung: Secondary | ICD-10-CM | POA: Diagnosis not present

## 2020-06-09 DIAGNOSIS — C771 Secondary and unspecified malignant neoplasm of intrathoracic lymph nodes: Secondary | ICD-10-CM | POA: Diagnosis not present

## 2020-06-09 DIAGNOSIS — C3432 Malignant neoplasm of lower lobe, left bronchus or lung: Secondary | ICD-10-CM | POA: Diagnosis not present

## 2020-06-09 DIAGNOSIS — I7789 Other specified disorders of arteries and arterioles: Secondary | ICD-10-CM | POA: Diagnosis not present

## 2020-06-09 MED ORDER — GADOBENATE DIMEGLUMINE 529 MG/ML IV SOLN
16.0000 mL | Freq: Once | INTRAVENOUS | Status: AC | PRN
  Administered 2020-06-09: 16 mL via INTRAVENOUS

## 2020-06-10 NOTE — Progress Notes (Signed)
Radiation Oncology         (336) [phone] ________________________________  Name: Andrea Kline MRN: [id number]  Date: 06/09/2020  DOB: 1955-12-27  Follow-Up Visit Note  Outpatient by telephone.  The patient opted for telemedicine to maximize safety during the pandemic.  MyChart video was not obtainable.   CC: [name], MD  [name], MD  Diagnosis and Prior Radiotherapy:    ICD-10-CM   1. 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  She just had a brain MRI. I reviewed the images myself and shared the results - no evidence of metastases.  She starts SBRT to the mediastinal nodes next week. Two nodes will be treated. I let her know a second node appears to have interval growth so we will target it, too (it's adjacent to the larger one).                              ALLERGIES:  is allergic to advair diskus [fluticasone-salmeterol] and codeine.  Meds: Current Outpatient Medications  Medication Sig Dispense Refill  . acetaminophen (TYLENOL) 500 MG tablet Take 500-1,000 mg by mouth every 6 (six) hours as needed (pain.).     Marland Kitchen atorvastatin (LIPITOR) 10 MG tablet Take 10 mg by mouth daily at 12 noon.     . celecoxib (CELEBREX) 200 MG capsule Take 200 mg by mouth daily at 12 noon.     . Cholecalciferol (VITAMIN D-3) 125 MCG (5000 UT) TABS Take 5,000 Units by mouth daily at 12 noon.    . diazepam (VALIUM) 10 MG tablet Take 5-10 mg by mouth daily as needed (vertigo).     Marland Kitchen escitalopram (LEXAPRO) 20 MG tablet Take 20 mg by mouth daily at 12 noon.     . Multiple Vitamin (MULTIVITAMIN WITH MINERALS) TABS tablet Take 1 tablet by mouth daily at 12 noon.    Marland Kitchen omeprazole (PRILOSEC) 40 MG capsule Take 40 mg by mouth daily at 12 noon.     Marland Kitchen PROAIR HFA 108 (90 Base) MCG/ACT inhaler Inhale 2 puffs into the lungs every 6 (six) hours as needed for  wheezing or shortness of breath.     . Tetrahydrozoline HCl (VISINE OP) Apply 1 drop to eye 3 (three) times daily as needed (dry eyes).      No current facility-administered medications for this encounter.    Physical Findings: The patient is in no acute distress. Patient is alert and oriented.  vitals were not taken for this visit. .      Lab Findings: Lab Results  Component Value Date   WBC 5.1 05/11/2020   HGB 12.3 05/11/2020   HCT 37.2 05/11/2020   MCV 101.4 (H) 05/11/2020   PLT 181 05/11/2020    Radiographic Findings: CT Chest W Contrast  Result Date: 05/12/2020 CLINICAL DATA:  Primary Cancer Type: Lung Imaging Indication: Routine surveillance Interval therapy since last imaging? 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 Primary Tumor location: Left upper and lower lobes. Surgeries: Left lower lobe wedge resection and lingular segmentectomy 08/21/2019. Chemotherapy: Yes; Ongoing? No; Most recent administration: 01/05/2020 Immunotherapy?  Yes; Type: Neulasta; Ongoing? No Radiation therapy? No EXAM: CT CHEST WITH CONTRAST TECHNIQUE:  Multidetector CT imaging of the chest was performed during intravenous contrast administration. CONTRAST:  75mL OMNIPAQUE IOHEXOL 300 MG/ML  SOLN COMPARISON:  Most recent CT chest 02/06/2020.  07/18/2019 PET-CT. FINDINGS: Cardiovascular: Aortic atherosclerosis. There is significant interval resolution of previously seen soft tissue stranding about the aortic arch (series 2, image 41). Normal heart size. Three-vessel coronary artery calcifications no pericardial effusion. Mediastinum/Nodes: Newly enlarged left superior mediastinal lymph node anterior to the left carotid and subclavian artery origins measuring 2.7 x 1.8 cm, previously 0.9 cm (series 2, image 27). Other prominent mediastinal lymph nodes are  unchanged. Thyroid gland, trachea, and esophagus demonstrate no significant findings. Lungs/Pleura: Mild centrilobular and paraseptal emphysema. Redemonstrated postoperative findings of lingular resection and left lower lobe wedge resection. No pleural effusion or pneumothorax. Upper Abdomen: No acute abnormality. Musculoskeletal: No chest wall mass or suspicious bone lesions identified. IMPRESSION: 1. Newly enlarged left superior mediastinal lymph node anterior to the left carotid and subclavian artery origins measuring 2.7 x 1.8 cm, previously 0.9 cm, concerning for metastatic disease. Other mediastinal lymph nodes are unchanged. 2. Redemonstrated postoperative findings of lingular resection and left lower lobe wedge resection. 3. There is significant interval resolution of previously seen soft tissue stranding about the aortic arch, consistent with resolution of penetrating ulceration or other acute aortic pathology incidentally noted on prior examination. Attention on follow-up. 4. Emphysema (ICD10-J43.9). 5. Coronary artery disease. Aortic Atherosclerosis (ICD10-I70.0). Electronically Signed   By: Eddie Candle M.D.   On: 05/12/2020 10:44   MR Brain W Wo Contrast  Result Date: 06/09/2020 CLINICAL DATA:  64 year old female with history of lung adenocarcinoma and small cell carcinoma diagnosed in 2020. Negative for brain metastases on MRI in November that year. Recent positive mediastinal lymph node. Has not yet undergone prophylactic whole brain radiation. Restaging. EXAM: MRI HEAD WITHOUT AND WITH CONTRAST TECHNIQUE: Multiplanar, multiecho pulse sequences of the brain and surrounding structures were obtained without and with intravenous contrast. CONTRAST:  70mL MULTIHANCE GADOBENATE DIMEGLUMINE 529 MG/ML IV SOLN COMPARISON:  Brain MRI 09/19/2019. FINDINGS: Brain: No abnormal enhancement identified. No midline shift, mass effect, or evidence of intracranial mass lesion. No dural thickening. Stable cerebral  volume. No restricted diffusion to suggest acute infarction. No ventriculomegaly, extra-axial collection or acute intracranial hemorrhage. Cervicomedullary junction and pituitary are within normal limits. Pearline Cables and white matter signal remains within normal limits for age, minimal nonspecific white matter T2 and FLAIR hyperintensity. No cortical encephalomalacia or chronic cerebral blood products. Vascular: Major intracranial vascular flow voids are stable. Ectatic distal vertebral arteries again noted. The major dural venous sinuses are enhancing and appear to be patent. Skull and upper cervical spine: Normal visible cervical spine and spinal cord. Visualized bone marrow signal is within normal limits. Sinuses/Orbits: Stable and negative. Other: Visible internal auditory structures appear normal. Mastoids remain clear. Stable scalp soft tissues. IMPRESSION: No metastatic disease or acute intracranial abnormality. Stable and normal for age MRI appearance of the brain. Electronically Signed   By: Genevie Ann M.D.   On: 06/09/2020 11:30   NM PET Image Restag (PS) Skull Base To Thigh  Result Date: 05/24/2020 CLINICAL DATA:  Subsequent treatment strategy for small cell lung cancer. Enlarging left upper mediastinal lymph node. EXAM: NUCLEAR MEDICINE PET SKULL BASE TO THIGH TECHNIQUE: 8.7 mCi F-18 FDG was injected intravenously. Full-ring PET imaging was performed from the skull base to thigh after the radiotracer. CT data was obtained and used for attenuation correction and anatomic localization. Fasting blood glucose: 120 mg/dl COMPARISON:  Multiple  exams, including CT chest from 05/11/2020 and PET-CT of 07/18/2019 FINDINGS: Mediastinal blood pool activity: SUV max 2.9 Liver activity: SUV max N/A NECK: No significant abnormal hypermetabolic activity in this region. Incidental CT findings: Bilateral common carotid atherosclerotic calcification. CHEST: Left prevascular node 1.8 cm in short axis on image 53/4 (formerly the  same on CT from 05/11/2020) with maximum SUV 6.4, compatible with malignancy. A smaller adjacent prevascular node on image 61/4 measures 0.7 cm in short axis with maximum SUV 2.3, below blood pool. Along the margin of the prior lingular segmentectomy there is scar-like density with low-grade activity with maximum SUV 2.9. Left lower lobe wedge resection scarring noted with maximum SUV of 2.1. Incidental CT findings: Mild cardiomegaly. Coronary, aortic arch, and branch vessel atherosclerotic vascular disease. ABDOMEN/PELVIS: No significant abnormal hypermetabolic activity in this region. Incidental CT findings: Aortoiliac atherosclerotic vascular disease. Descending and sigmoid colon diverticulosis. SKELETON: No significant abnormal hypermetabolic activity in this region. Incidental CT findings: Lower lumbar degenerative foraminal impingement at disc disease suspected right L5-S1. IMPRESSION: 1. The left prevascular lymph node has a maximum SUV of 6.4, compatible with malignancy. No other specific indicators of active malignancy are identified. 2. Postoperative findings in the lingula and left lower lobe. 3. Mild cardiomegaly. Coronary atherosclerosis. Aortic Atherosclerosis (ICD10-I70.0). 4. Descending and sigmoid colon diverticulosis. 5. Potential right foraminal impingement at L5-S1. Electronically Signed   By: Van Clines M.D.   On: 05/24/2020 08:26    Impression/Plan: NSCLC and Small Cell Lung Cancer.   We again discussed PCI (pros and cons and side effects and benefits of this).  She wants to proceed with this. I ordered simulation to occur on a day when she is here for SBRT treatment.  Anticipate 25Gy/10 fx to the whole brain. She is pleased w/ this plan.  This encounter was provided by telemedicine platform by telephone.  The patient opted for telemedicine to maximize safety during the pandemic.  MyChart video was not obtainable. The patient has given verbal consent for this type of encounter  and has been advised to only accept a meeting of this type in a secure network environment. The attendants for this meeting include Eppie Gibson  and TRAMEKA DOROUGH.  During the encounter, Eppie Gibson was located at [facility] Radiation Oncology Department.  Andrea Kline was located at home.   On date of service, in total, I spent 20 minutes on this encounter.  _____________________________________   Eppie Gibson, MD

## 2020-06-16 ENCOUNTER — Other Ambulatory Visit: Payer: Self-pay

## 2020-06-16 ENCOUNTER — Ambulatory Visit: Payer: PPO | Admitting: Radiation Oncology

## 2020-06-16 ENCOUNTER — Ambulatory Visit
Admission: RE | Admit: 2020-06-16 | Discharge: 2020-06-16 | Disposition: A | Discharge status: Home / Self Care | Payer: PPO | Source: Ambulatory Visit | Attending: Radiation Oncology | Admitting: Radiation Oncology

## 2020-06-16 DIAGNOSIS — Z510 Encounter for antineoplastic radiation therapy: Secondary | ICD-10-CM | POA: Diagnosis not present

## 2020-06-18 ENCOUNTER — Ambulatory Visit
Admission: RE | Admit: 2020-06-18 | Discharge: 2020-06-18 | Disposition: A | Discharge status: Home / Self Care | Payer: PPO | Source: Ambulatory Visit | Attending: Radiation Oncology | Admitting: Radiation Oncology

## 2020-06-18 ENCOUNTER — Other Ambulatory Visit: Payer: Self-pay

## 2020-06-18 DIAGNOSIS — Z510 Encounter for antineoplastic radiation therapy: Secondary | ICD-10-CM | POA: Diagnosis not present

## 2020-06-21 ENCOUNTER — Ambulatory Visit
Admission: RE | Admit: 2020-06-21 | Discharge: 2020-06-21 | Disposition: A | Discharge status: Home / Self Care | Payer: PPO | Source: Ambulatory Visit | Attending: Radiation Oncology | Admitting: Radiation Oncology

## 2020-06-21 ENCOUNTER — Other Ambulatory Visit: Payer: Self-pay

## 2020-06-21 DIAGNOSIS — Z510 Encounter for antineoplastic radiation therapy: Secondary | ICD-10-CM | POA: Diagnosis not present

## 2020-06-21 DIAGNOSIS — C771 Secondary and unspecified malignant neoplasm of intrathoracic lymph nodes: Secondary | ICD-10-CM | POA: Diagnosis not present

## 2020-06-21 DIAGNOSIS — C3432 Malignant neoplasm of lower lobe, left bronchus or lung: Secondary | ICD-10-CM | POA: Diagnosis not present

## 2020-06-22 DIAGNOSIS — Z510 Encounter for antineoplastic radiation therapy: Secondary | ICD-10-CM | POA: Diagnosis not present

## 2020-06-22 DIAGNOSIS — C771 Secondary and unspecified malignant neoplasm of intrathoracic lymph nodes: Secondary | ICD-10-CM | POA: Diagnosis not present

## 2020-06-22 DIAGNOSIS — C3432 Malignant neoplasm of lower lobe, left bronchus or lung: Secondary | ICD-10-CM | POA: Diagnosis not present

## 2020-06-23 ENCOUNTER — Ambulatory Visit
Admission: RE | Admit: 2020-06-23 | Discharge: 2020-06-23 | Disposition: A | Discharge status: Home / Self Care | Payer: PPO | Source: Ambulatory Visit | Attending: Radiation Oncology | Admitting: Radiation Oncology

## 2020-06-23 ENCOUNTER — Other Ambulatory Visit: Payer: Self-pay

## 2020-06-23 DIAGNOSIS — Z510 Encounter for antineoplastic radiation therapy: Secondary | ICD-10-CM | POA: Diagnosis not present

## 2020-06-24 ENCOUNTER — Ambulatory Visit
Admission: RE | Admit: 2020-06-24 | Discharge: 2020-06-24 | Disposition: A | Discharge status: Home / Self Care | Payer: PPO | Source: Ambulatory Visit | Attending: Radiation Oncology | Admitting: Radiation Oncology

## 2020-06-24 ENCOUNTER — Other Ambulatory Visit: Payer: Self-pay

## 2020-06-24 DIAGNOSIS — Z510 Encounter for antineoplastic radiation therapy: Secondary | ICD-10-CM | POA: Diagnosis not present

## 2020-06-25 ENCOUNTER — Other Ambulatory Visit: Payer: Self-pay

## 2020-06-25 ENCOUNTER — Ambulatory Visit
Admission: RE | Admit: 2020-06-25 | Discharge: 2020-06-25 | Disposition: A | Discharge status: Home / Self Care | Payer: PPO | Source: Ambulatory Visit | Attending: Radiation Oncology | Admitting: Radiation Oncology

## 2020-06-25 DIAGNOSIS — Z510 Encounter for antineoplastic radiation therapy: Secondary | ICD-10-CM | POA: Diagnosis not present

## 2020-06-28 ENCOUNTER — Ambulatory Visit
Admission: RE | Admit: 2020-06-28 | Discharge: 2020-06-28 | Disposition: A | Discharge status: Home / Self Care | Payer: PPO | Source: Ambulatory Visit | Attending: Radiation Oncology | Admitting: Radiation Oncology

## 2020-06-28 ENCOUNTER — Other Ambulatory Visit: Payer: Self-pay

## 2020-06-28 DIAGNOSIS — Z510 Encounter for antineoplastic radiation therapy: Secondary | ICD-10-CM | POA: Diagnosis not present

## 2020-06-29 ENCOUNTER — Other Ambulatory Visit: Payer: Self-pay

## 2020-06-29 ENCOUNTER — Ambulatory Visit
Admission: RE | Admit: 2020-06-29 | Discharge: 2020-06-29 | Disposition: A | Discharge status: Home / Self Care | Payer: PPO | Source: Ambulatory Visit | Attending: Radiation Oncology | Admitting: Radiation Oncology

## 2020-06-29 DIAGNOSIS — Z510 Encounter for antineoplastic radiation therapy: Secondary | ICD-10-CM | POA: Diagnosis not present

## 2020-06-29 DIAGNOSIS — C771 Secondary and unspecified malignant neoplasm of intrathoracic lymph nodes: Secondary | ICD-10-CM | POA: Diagnosis not present

## 2020-06-29 DIAGNOSIS — C3432 Malignant neoplasm of lower lobe, left bronchus or lung: Secondary | ICD-10-CM | POA: Diagnosis not present

## 2020-06-30 ENCOUNTER — Other Ambulatory Visit: Payer: Self-pay

## 2020-06-30 ENCOUNTER — Ambulatory Visit
Admission: RE | Admit: 2020-06-30 | Discharge: 2020-06-30 | Disposition: A | Discharge status: Home / Self Care | Payer: PPO | Source: Ambulatory Visit | Attending: Radiation Oncology | Admitting: Radiation Oncology

## 2020-06-30 DIAGNOSIS — Z510 Encounter for antineoplastic radiation therapy: Secondary | ICD-10-CM | POA: Diagnosis not present

## 2020-07-01 ENCOUNTER — Other Ambulatory Visit: Payer: Self-pay

## 2020-07-01 ENCOUNTER — Ambulatory Visit
Admission: RE | Admit: 2020-07-01 | Discharge: 2020-07-01 | Disposition: A | Discharge status: Home / Self Care | Payer: PPO | Source: Ambulatory Visit | Attending: Radiation Oncology | Admitting: Radiation Oncology

## 2020-07-01 DIAGNOSIS — Z510 Encounter for antineoplastic radiation therapy: Secondary | ICD-10-CM | POA: Diagnosis not present

## 2020-07-01 NOTE — Progress Notes (Signed)
Patient asked to be seen after radiation on 06/30/20. She stated that for the past several days she's been experiencing light headedness, dizziness, and feeling like she's going to black out when she stands up. She stated these symptoms feel different from when she has a vertigo attack, and make her worried about falling at home. She denied any changes in medication, and states that she is eating and drinking well. Vitals taken while sitting and standing, and negligible difference noted. Patient stated she didn't feel the same sensations when she stood up today, and she was reassured that her vitals didn't alter drastically. I encouraged her to continue to monitor her symptoms to try to drink even more fluids. I informed her that if these symptoms persist we could see about getting some lab work drawn and administering some IVFs to see if that helps bolster her. Patient verbalized appreciation for offer, and knows to call clinic should she experience any new or worsening symptoms. Patient assisted upstairs to valet via wheelchair.   06/30/20 1500 06/30/20 1502  Vitals  BP 108/82 96/68  BP Location Right Arm Right Arm  Patient Position Sitting Standing  Pulse Rate 89 93  Pulse Oximetry  SpO2 100 % 99 %

## 2020-07-02 ENCOUNTER — Other Ambulatory Visit: Payer: Self-pay

## 2020-07-02 ENCOUNTER — Ambulatory Visit
Admission: RE | Admit: 2020-07-02 | Discharge: 2020-07-02 | Disposition: A | Discharge status: Home / Self Care | Payer: PPO | Source: Ambulatory Visit | Attending: Radiation Oncology | Admitting: Radiation Oncology

## 2020-07-02 DIAGNOSIS — Z510 Encounter for antineoplastic radiation therapy: Secondary | ICD-10-CM | POA: Diagnosis not present

## 2020-07-05 ENCOUNTER — Other Ambulatory Visit: Payer: Self-pay

## 2020-07-05 ENCOUNTER — Ambulatory Visit
Admission: RE | Admit: 2020-07-05 | Discharge: 2020-07-05 | Disposition: A | Discharge status: Home / Self Care | Payer: PPO | Source: Ambulatory Visit | Attending: Radiation Oncology | Admitting: Radiation Oncology

## 2020-07-05 DIAGNOSIS — Z510 Encounter for antineoplastic radiation therapy: Secondary | ICD-10-CM | POA: Diagnosis not present

## 2020-07-06 ENCOUNTER — Ambulatory Visit
Admission: RE | Admit: 2020-07-06 | Discharge: 2020-07-06 | Disposition: A | Discharge status: Home / Self Care | Payer: PPO | Source: Ambulatory Visit | Attending: Radiation Oncology | Admitting: Radiation Oncology

## 2020-07-06 ENCOUNTER — Encounter: Payer: Self-pay | Admitting: Radiation Oncology

## 2020-07-06 ENCOUNTER — Other Ambulatory Visit: Payer: Self-pay

## 2020-07-06 DIAGNOSIS — Z510 Encounter for antineoplastic radiation therapy: Secondary | ICD-10-CM | POA: Diagnosis not present

## 2020-07-06 DIAGNOSIS — C3432 Malignant neoplasm of lower lobe, left bronchus or lung: Secondary | ICD-10-CM | POA: Diagnosis not present

## 2020-07-06 DIAGNOSIS — C771 Secondary and unspecified malignant neoplasm of intrathoracic lymph nodes: Secondary | ICD-10-CM | POA: Diagnosis not present

## 2020-07-26 ENCOUNTER — Other Ambulatory Visit: Payer: Self-pay

## 2020-08-03 DIAGNOSIS — H2513 Age-related nuclear cataract, bilateral: Secondary | ICD-10-CM | POA: Diagnosis not present

## 2020-08-03 DIAGNOSIS — H18413 Arcus senilis, bilateral: Secondary | ICD-10-CM | POA: Diagnosis not present

## 2020-08-03 DIAGNOSIS — H25043 Posterior subcapsular polar age-related cataract, bilateral: Secondary | ICD-10-CM | POA: Diagnosis not present

## 2020-08-03 DIAGNOSIS — H25013 Cortical age-related cataract, bilateral: Secondary | ICD-10-CM | POA: Diagnosis not present

## 2020-08-03 DIAGNOSIS — H2512 Age-related nuclear cataract, left eye: Secondary | ICD-10-CM | POA: Diagnosis not present

## 2020-08-04 ENCOUNTER — Encounter: Payer: Self-pay | Admitting: Internal Medicine

## 2020-08-05 DIAGNOSIS — E039 Hypothyroidism, unspecified: Secondary | ICD-10-CM | POA: Diagnosis not present

## 2020-08-05 DIAGNOSIS — E7800 Pure hypercholesterolemia, unspecified: Secondary | ICD-10-CM | POA: Diagnosis not present

## 2020-08-05 DIAGNOSIS — C3490 Malignant neoplasm of unspecified part of unspecified bronchus or lung: Secondary | ICD-10-CM | POA: Diagnosis not present

## 2020-08-05 DIAGNOSIS — F325 Major depressive disorder, single episode, in full remission: Secondary | ICD-10-CM | POA: Diagnosis not present

## 2020-08-07 ENCOUNTER — Encounter: Payer: Self-pay | Admitting: Internal Medicine

## 2020-08-09 DIAGNOSIS — H2512 Age-related nuclear cataract, left eye: Secondary | ICD-10-CM | POA: Diagnosis not present

## 2020-08-10 DIAGNOSIS — H2511 Age-related nuclear cataract, right eye: Secondary | ICD-10-CM | POA: Diagnosis not present

## 2020-08-10 NOTE — Progress Notes (Signed)
                                                                                                                                                            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 She received SBRT to recurrent mediastinal nodes and PCI to the brain (prophylactic cranial irradiation).  Plan: The patient will follow-up with radiation oncology in 1 mo, sooner PRN .  -----------------------------------  Eppie Gibson, MD

## 2020-08-11 ENCOUNTER — Ambulatory Visit
Admission: RE | Admit: 2020-08-11 | Discharge: 2020-08-11 | Disposition: A | Discharge status: Home / Self Care | Payer: PPO | Source: Ambulatory Visit | Attending: Radiation Oncology | Admitting: Radiation Oncology

## 2020-08-11 ENCOUNTER — Other Ambulatory Visit: Payer: Self-pay

## 2020-08-11 VITALS — BP 119/80 | HR 79 | Temp 97.1°F | Resp 20 | Ht 65.0 in | Wt 170.5 lb

## 2020-08-11 DIAGNOSIS — C3492 Malignant neoplasm of unspecified part of left bronchus or lung: Secondary | ICD-10-CM

## 2020-08-11 DIAGNOSIS — R059 Cough, unspecified: Secondary | ICD-10-CM | POA: Insufficient documentation

## 2020-08-11 DIAGNOSIS — Z791 Long term (current) use of non-steroidal anti-inflammatories (NSAID): Secondary | ICD-10-CM | POA: Diagnosis not present

## 2020-08-11 DIAGNOSIS — Z298 Encounter for other specified prophylactic measures: Secondary | ICD-10-CM | POA: Diagnosis not present

## 2020-08-11 DIAGNOSIS — R5383 Other fatigue: Secondary | ICD-10-CM | POA: Diagnosis not present

## 2020-08-11 DIAGNOSIS — E7800 Pure hypercholesterolemia, unspecified: Secondary | ICD-10-CM | POA: Diagnosis not present

## 2020-08-11 DIAGNOSIS — Z79899 Other long term (current) drug therapy: Secondary | ICD-10-CM | POA: Insufficient documentation

## 2020-08-11 DIAGNOSIS — C771 Secondary and unspecified malignant neoplasm of intrathoracic lymph nodes: Secondary | ICD-10-CM

## 2020-08-11 DIAGNOSIS — R110 Nausea: Secondary | ICD-10-CM | POA: Diagnosis not present

## 2020-08-11 DIAGNOSIS — C3490 Malignant neoplasm of unspecified part of unspecified bronchus or lung: Secondary | ICD-10-CM | POA: Diagnosis not present

## 2020-08-11 DIAGNOSIS — R42 Dizziness and giddiness: Secondary | ICD-10-CM | POA: Insufficient documentation

## 2020-08-11 DIAGNOSIS — E039 Hypothyroidism, unspecified: Secondary | ICD-10-CM | POA: Diagnosis not present

## 2020-08-11 DIAGNOSIS — R630 Anorexia: Secondary | ICD-10-CM | POA: Diagnosis not present

## 2020-08-11 DIAGNOSIS — F325 Major depressive disorder, single episode, in full remission: Secondary | ICD-10-CM | POA: Diagnosis not present

## 2020-08-11 NOTE — Progress Notes (Signed)
Ms. Watterson presents today for follow-up of SBRT to recurrent left mediastinal nodes and PCI to the brain completed on 07/06/2020  Neuro: Patient denies any headaches. She continues to deal with balance issues/dizziness. She often feels dizzy when she changes position (sitting to standing). Does have a history of vertigo. The balance issues have gotten slightly worse recently because of her cataract suregry earlier this week. She also reports "brain fog" and difficulty figuring out certain matters related to time/money. Cough: Occasional dry cough at night SOB: With activity Appetite: Patient reports she has a poor appetite and often does not feel like eating. She also reports on-going nausea that contributes to her lack of appetite Wt Readings from Last 3 Encounters:  08/11/20 170 lb 8 oz (77.3 kg)  06/02/20 178 lb 2 oz (80.8 kg)  05/27/20 177 lb 11.2 oz (80.6 kg)   Swallowing: Patient reports issues have improved since completing radiation. During SBRT treatment patient had some difficulty swallowing (felt like there was a lump in her throat), but that the issue seems to have resolved. She does take medicine for acid reflux  Other notable issues: Reports she's not sleeping well and still dealing with lingering fatigue. She has intermittent sternal discomfort that seems to occur randomly (regardless of activity). She states she can often make the symptom go away if she focuses on her breathing.  Vitals:   08/11/20 1142  BP: 131/83  Pulse: 94  Resp: 20  Temp: (!) 97.1 F (36.2 C)  SpO2: 99%

## 2020-08-13 ENCOUNTER — Encounter: Payer: Self-pay | Admitting: Radiation Oncology

## 2020-08-13 NOTE — Progress Notes (Signed)
Radiation Oncology         (336) [phone] ________________________________  Name: Andrea Kline MRN: [id number]  Date: 08/11/2020  DOB: May 04, 1956  Follow-Up Visit Note  Outpatient  CC: Andrea Manes, MD  Andrea Manes, MD  Diagnosis and Prior Radiotherapy:    ICD-10-CM   1. Secondary malignancy of mediastinal lymph nodes ([facility])  C77.1   2. 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   Andrea Kline presents today for follow-up of SBRT to recurrent left mediastinal nodes and PCI to the brain completed on 07/06/2020  She expresses disappointment with her slow recovery from prophylactic cranial radiation and SBRT to the chest.  To complicate matters, she recently underwent cataract surgery.  Neuro: Patient denies any headaches. She continues to deal with balance issues/dizziness. She often feels dizzy when she changes position (sitting to standing) but this resolves within half a minute. Does have a history of vertigo and she reports that this dizziness is not like vertigo. The balance issues have gotten slightly worse recently because of her cataract surgery earlier this week. She also reports "brain fog" and difficulty figuring out certain matters related to time/money.  This is slowly getting better  Cough: Occasional dry cough at night SOB: With activity Appetite: Patient reports she has a poor appetite and often does not feel like eating. She also reports on-going nausea that contributes to her lack of appetite but does not wish any new  prescriptions for antinausea medication at this time.  She denies vomiting. Wt Readings from Last 3 Encounters:  08/11/20 170 lb 8 oz (77.3 kg)  06/02/20 178 lb 2 oz (80.8 kg)  05/27/20 177 lb 11.2 oz (80.6 kg)   Swallowing: Patient reports issues have improved since completing radiation. During SBRT treatment patient had some difficulty swallowing (felt like there was a lump in her throat), but that the issue seems to have resolved. She does take medicine for acid reflux  Other notable issues: Reports she's not sleeping well and still dealing with lingering fatigue. She has intermittent sternal discomfort that seems to occur randomly (regardless of activity). She states she can often make the symptom go away if she focuses on her breathing.  Orthostatic vitals revealed no significant change from sitting to standing: Vitals:   08/11/20 1231 08/11/20 1232  BP: 119/88 119/80  Pulse: 73 79  Resp:    Temp:    SpO2:                                  ALLERGIES:  is allergic to advair diskus [fluticasone-salmeterol] and codeine.  Meds: Current Outpatient Medications  Medication Sig Dispense Refill  . acetaminophen (TYLENOL) 500 MG tablet Take 500-1,000 mg by mouth every 6 (six) hours as needed (pain.).     Marland Kitchen atorvastatin (LIPITOR) 10 MG tablet Take 10 mg by mouth daily at 12 noon.     Marland Kitchen BESIVANCE 0.6 % SUSP Place 1 drop into the left eye 3 (three) times daily.    Marland Kitchen  celecoxib (CELEBREX) 200 MG capsule Take 200 mg by mouth daily at 12 noon.     . Cholecalciferol (VITAMIN D-3) 125 MCG (5000 UT) TABS Take 5,000 Units by mouth daily at 12 noon.    . diazepam (VALIUM) 10 MG tablet Take 5-10 mg by mouth daily as needed (vertigo).     . DUREZOL 0.05 % EMUL Place 1 drop into the right eye 3 (three) times daily.    Marland Kitchen escitalopram (LEXAPRO) 20 MG tablet Take 20 mg by mouth daily at 12 noon.     . Multiple Vitamin (MULTIVITAMIN WITH MINERALS) TABS tablet Take 1 tablet by mouth daily at 12 noon.    Marland Kitchen  omeprazole (PRILOSEC) 40 MG capsule Take 40 mg by mouth daily at 12 noon.     Marland Kitchen PROAIR HFA 108 (90 Base) MCG/ACT inhaler Inhale 2 puffs into the lungs every 6 (six) hours as needed for wheezing or shortness of breath.     Marland Kitchen PROLENSA 0.07 % SOLN Place 1 drop into the left eye at bedtime.    . Tetrahydrozoline HCl (VISINE OP) Apply 1 drop to eye 3 (three) times daily as needed (dry eyes).      No current facility-administered medications for this encounter.    Physical Findings:   height is 5\' 5"  (1.651 m) and weight is 170 lb 8 oz (77.3 kg). Her temporal temperature is 97.1 F (36.2 C) (abnormal). Her blood pressure is 119/80 and her pulse is 79. Her respiration is 20 and oxygen saturation is 99%. .     Orthostatic vitals negative as above in the narrative  Gen: The patient is in no acute distress. Patient is alert and oriented. HEENT: No oral thrush; tympanic membranes and external auditory meatus bilaterally are clear. MSK: ambulatory Neuro: non focal, speech is fluent, able to give a thorough history   Lab Findings: Lab Results  Component Value Date   WBC 5.1 05/11/2020   HGB 12.3 05/11/2020   HCT 37.2 05/11/2020   MCV 101.4 (H) 05/11/2020   PLT 181 05/11/2020    Radiographic Findings: No results found.  Impression/Plan:   This is a very nice 64 year old woman with recurrent small cell lung cancer who recently completed prophylactic cranial irradiation SBRT to recurrent lymph nodes in the mediastinum.  The patient is slowly healing from radiation therapy.  She has some dizziness with standing which is consistent with transient orthostatic hypotension but this normalizes within a half-minute as confirmed by her vital signs today.  I reassured the patient that the fatigue she is having normally takes months to fully resolve.  Her "brain fog" is slowly improving and I expect it will continue to improve.  We talked about occupational therapy for cognitive exercises that can be  considered in the future if the resolution is not to her satisfaction.  I recommend a restaging MRI of her brain to take place approximately 3 months after completion of radiation therapy.  It appears that medical oncology has already ordered for the MRI to take place at the same time that she undergoes restaging CT scan of her chest.  I will ask our CNS coordinator to look into this and to arrange for me to see her soon after the MRI of her brain.  Ms. Babinski is agreeable with this plan.  I encouraged her to call if she has any questions or concerns in the interim.  On date of service, in total, I spent 20 minutes on this encounter. Patient was seen  in person.  _____________________________________   Eppie Gibson, MD

## 2020-08-16 IMAGING — DX DG CHEST 1V PORT
1 series · 1 of 1 positions shown · non-contrast
Comparison: 08/21/2019

CLINICAL DATA: Chest tube

EXAM:
PORTABLE CHEST 1 VIEW

[chest ap]
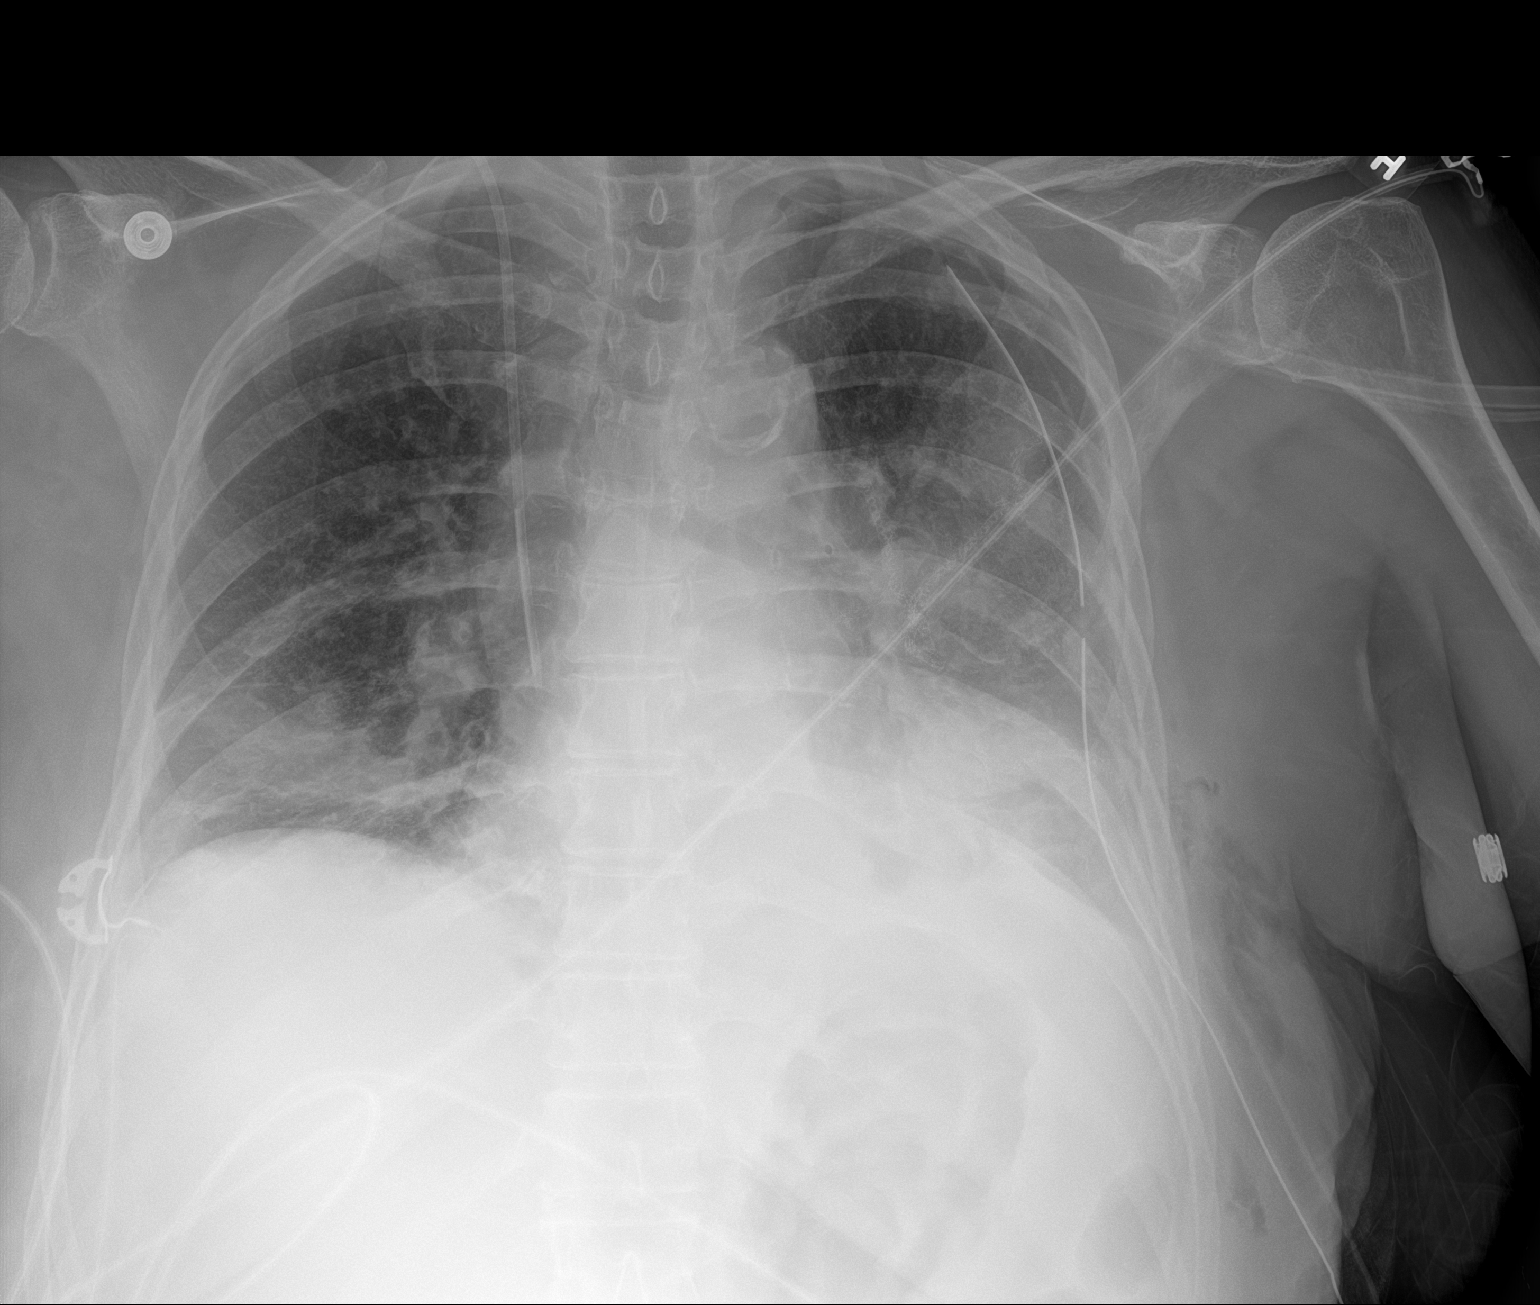

[1 of 1 positions shown; findings below may reference images not displayed]

FINDINGS: No significant interval change in AP portable examination with
left-sided chest tube, surgical suture of the left lung, and no
significant pneumothorax. There is left basilar atelectasis or
consolidation and a small left pleural effusion. Mild atelectasis of
the right lung base. Mild cardiomegaly. Right neck vascular
catheter. Subcutaneous emphysema about the left chest.
IMPRESSION: 1. No significant interval change in AP portable examination with
left-sided chest tube, surgical suture of the left lung, and no
significant pneumothorax. There is left basilar atelectasis or
consolidation and a small left pleural effusion.

2.  Mild atelectasis of the right lung base.

## 2020-08-17 ENCOUNTER — Other Ambulatory Visit: Payer: Self-pay | Admitting: Radiation Therapy

## 2020-08-17 DIAGNOSIS — C3490 Malignant neoplasm of unspecified part of unspecified bronchus or lung: Secondary | ICD-10-CM

## 2020-08-18 IMAGING — CR DG CHEST 2V
2 series · 2 of 2 positions shown · non-contrast
Comparison: 08/23/2019; 08/22/2019; 08/21/2019

CLINICAL DATA: History of chest tube placement.

EXAM:
CHEST - 2 VIEW

[chest pa]
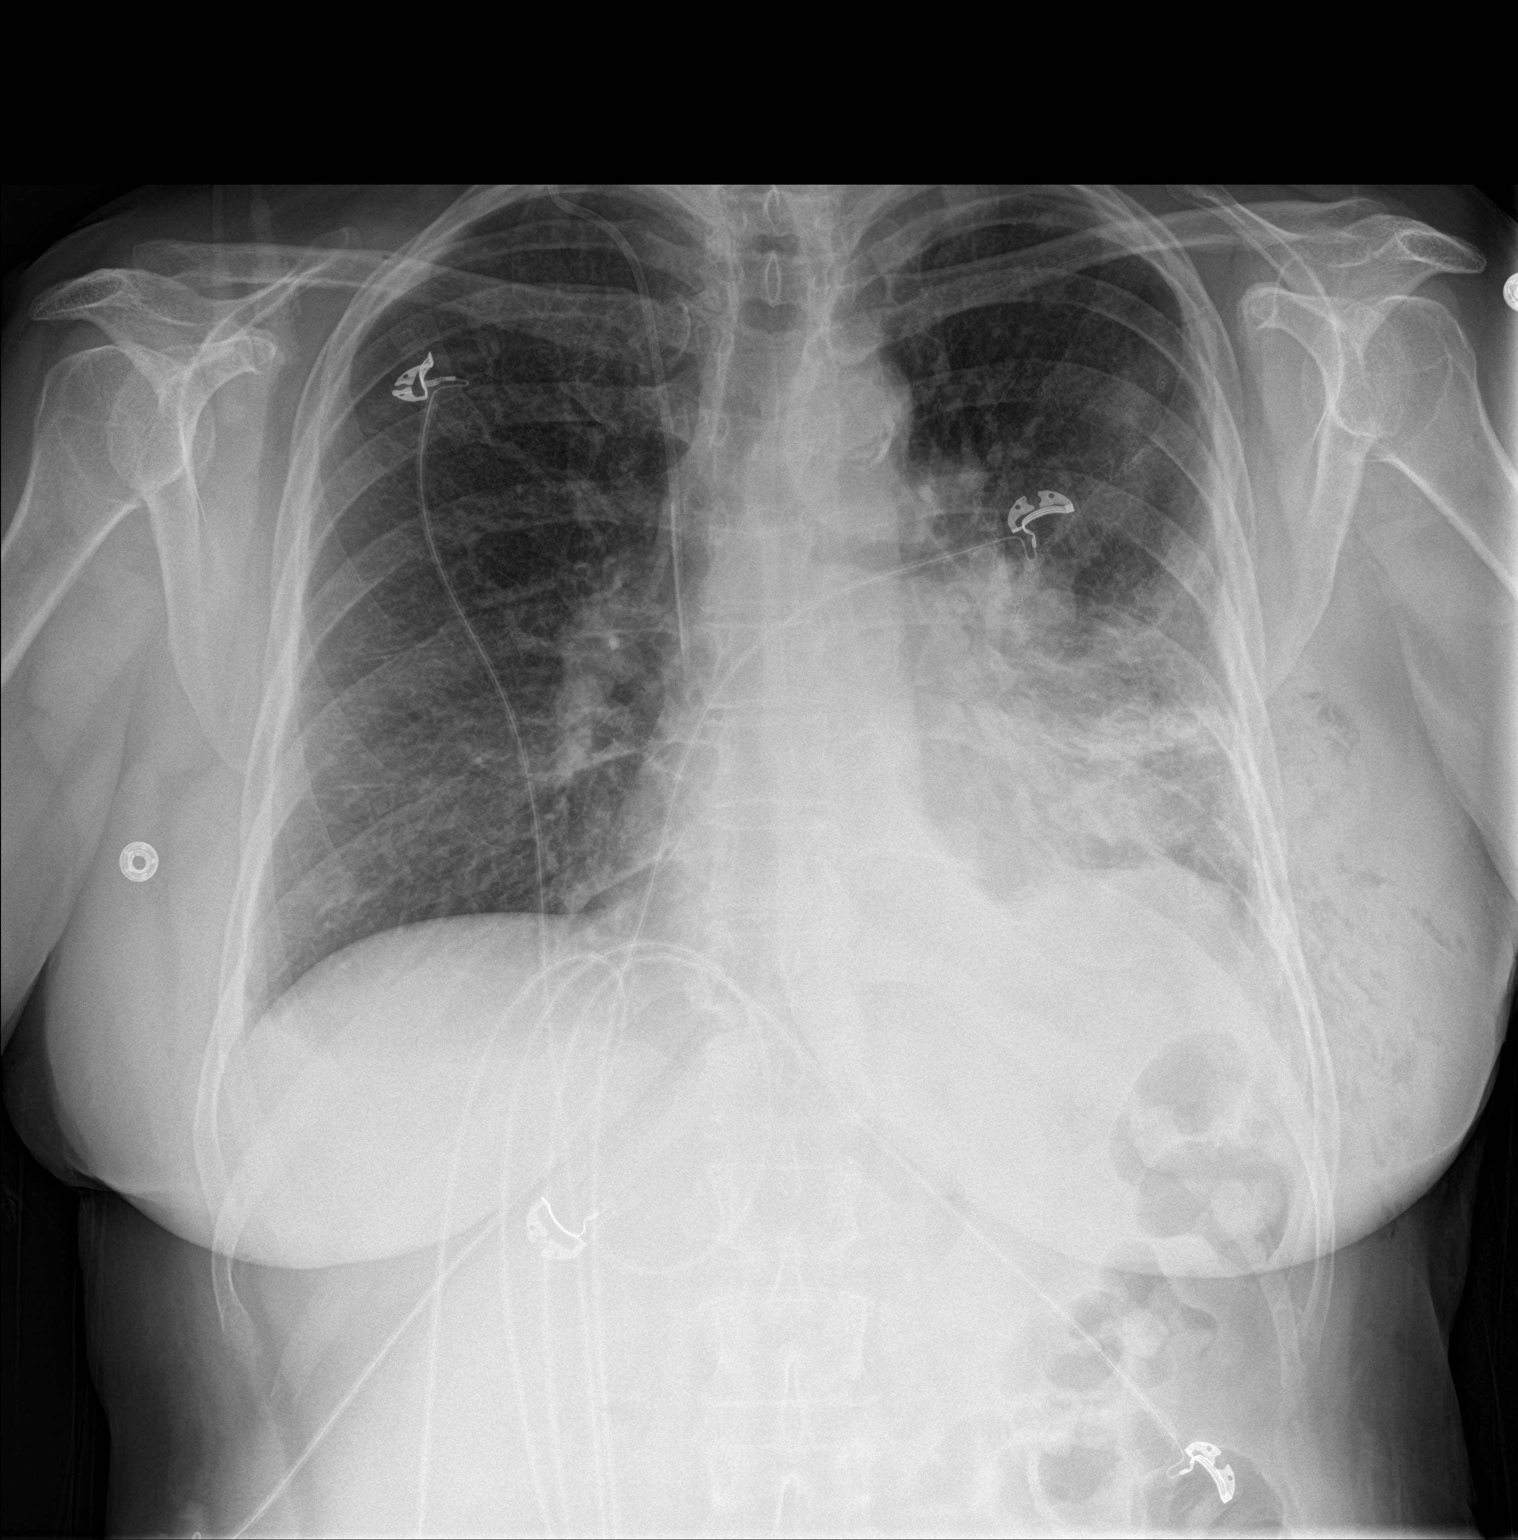

[chest lat]
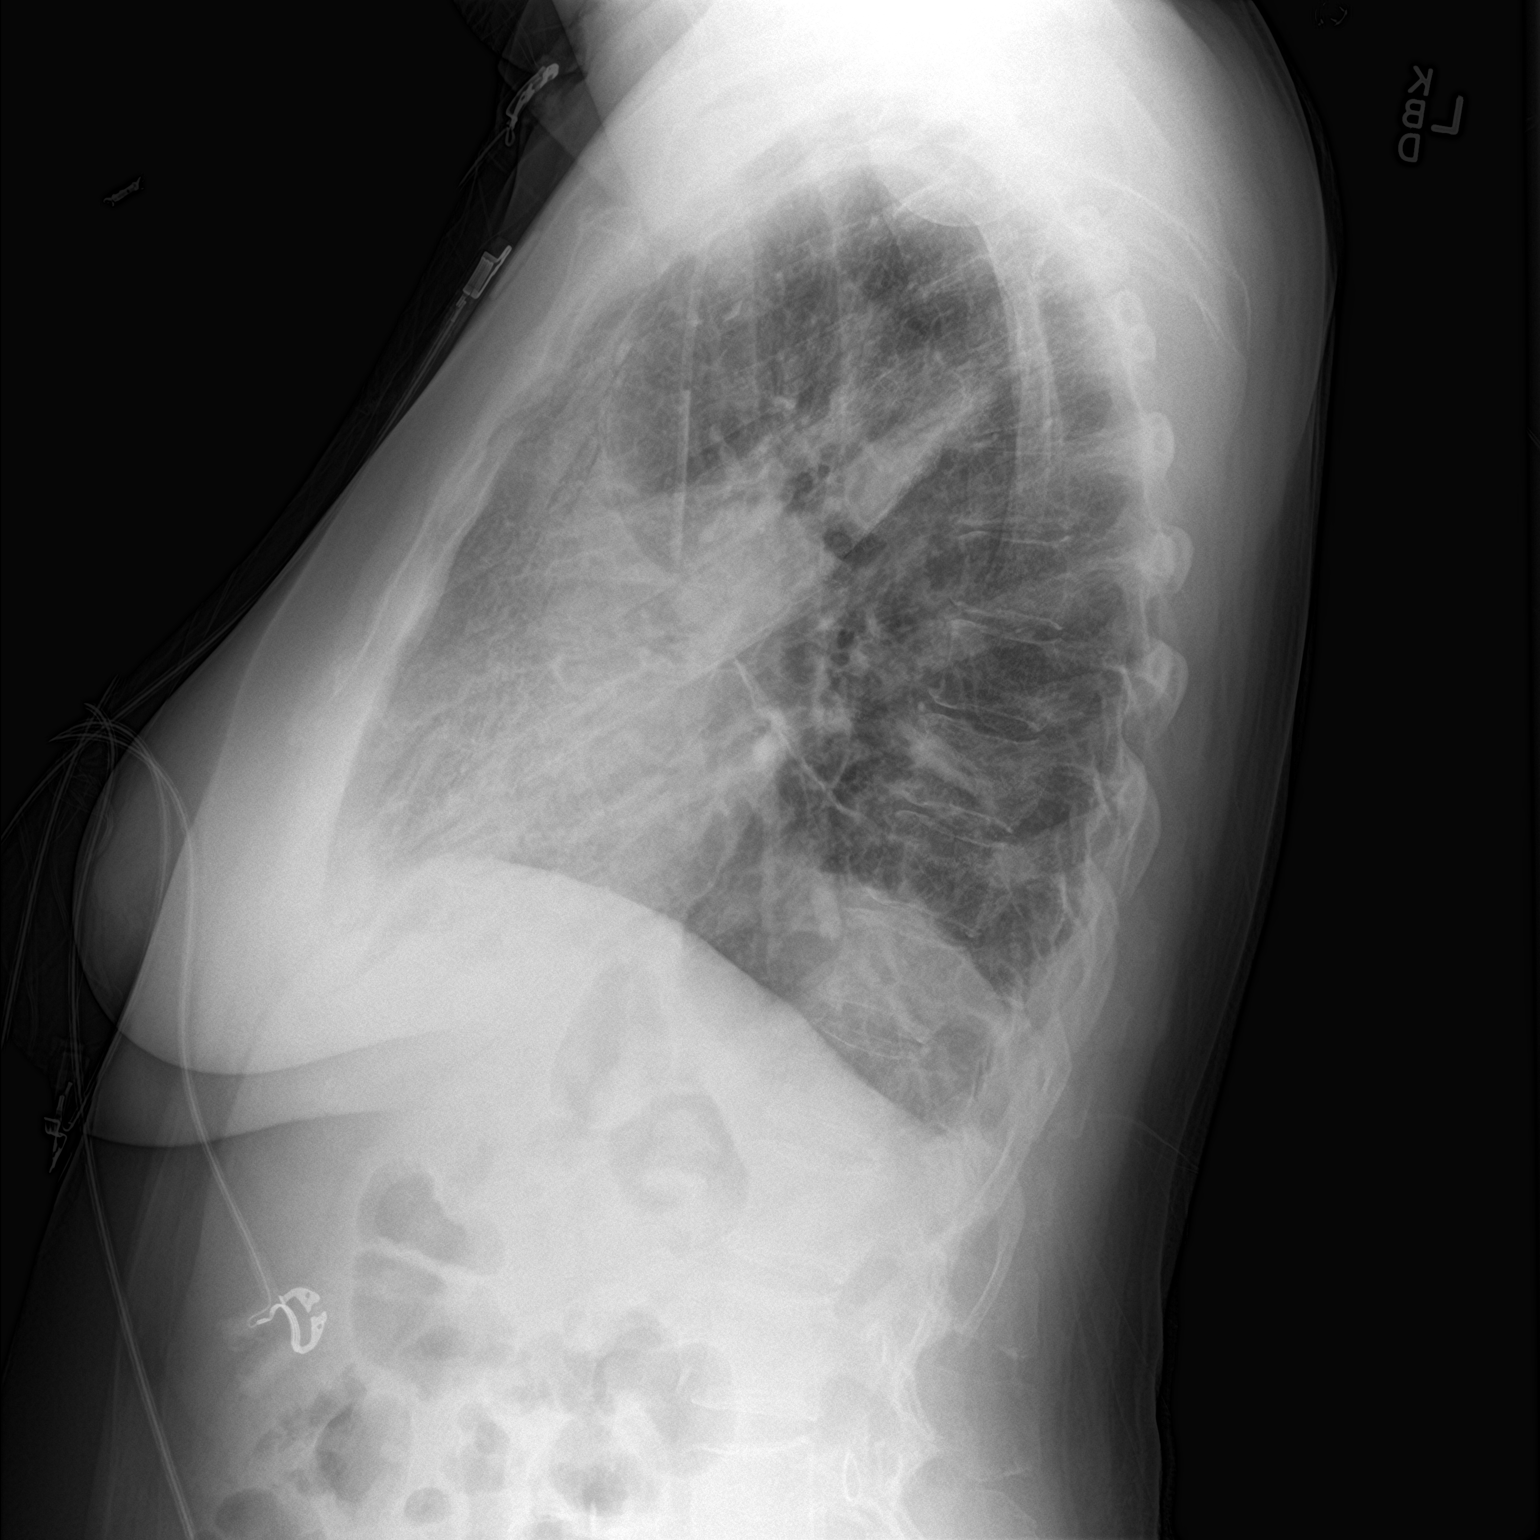

[2 of 2 positions shown; findings below may reference images not displayed]

FINDINGS: Grossly unchanged cardiac silhouette and mediastinal contours with
partial obscuration of the left heart border secondary to grossly
unchanged left mid and lower lung heterogeneous/consolidative
opacities and suspected trace left-sided pleural effusion. The
amount of left lateral chest wall subcutaneous emphysema is grossly
unchanged. Previously identified tiny left apical pneumothorax is
not confidently seen on the present examination. Improved aeration
of the right lung. Stable position of right jugular approach
intravenous catheter. No definite evidence of edema. No evidence of
right-sided pleural effusion. No acute osseous abnormalities.
IMPRESSION: 1. No definite left-sided pneumothorax post recent chest tube
removal.
2. Grossly unchanged trace left-sided effusion associated left mid
and lower lung heterogeneous opacities, likely atelectasis.
3. Improved aeration of the right lung suggests resolved
atelectasis.

## 2020-08-19 IMAGING — DX DG CHEST 2V
2 series · 2 of 2 positions shown · non-contrast
Comparison: Radiograph yesterday.

CLINICAL DATA: Status post partial lobectomy of lung. Cough and
chest pain this morning.

EXAM:
CHEST - 2 VIEW

[chest pa]
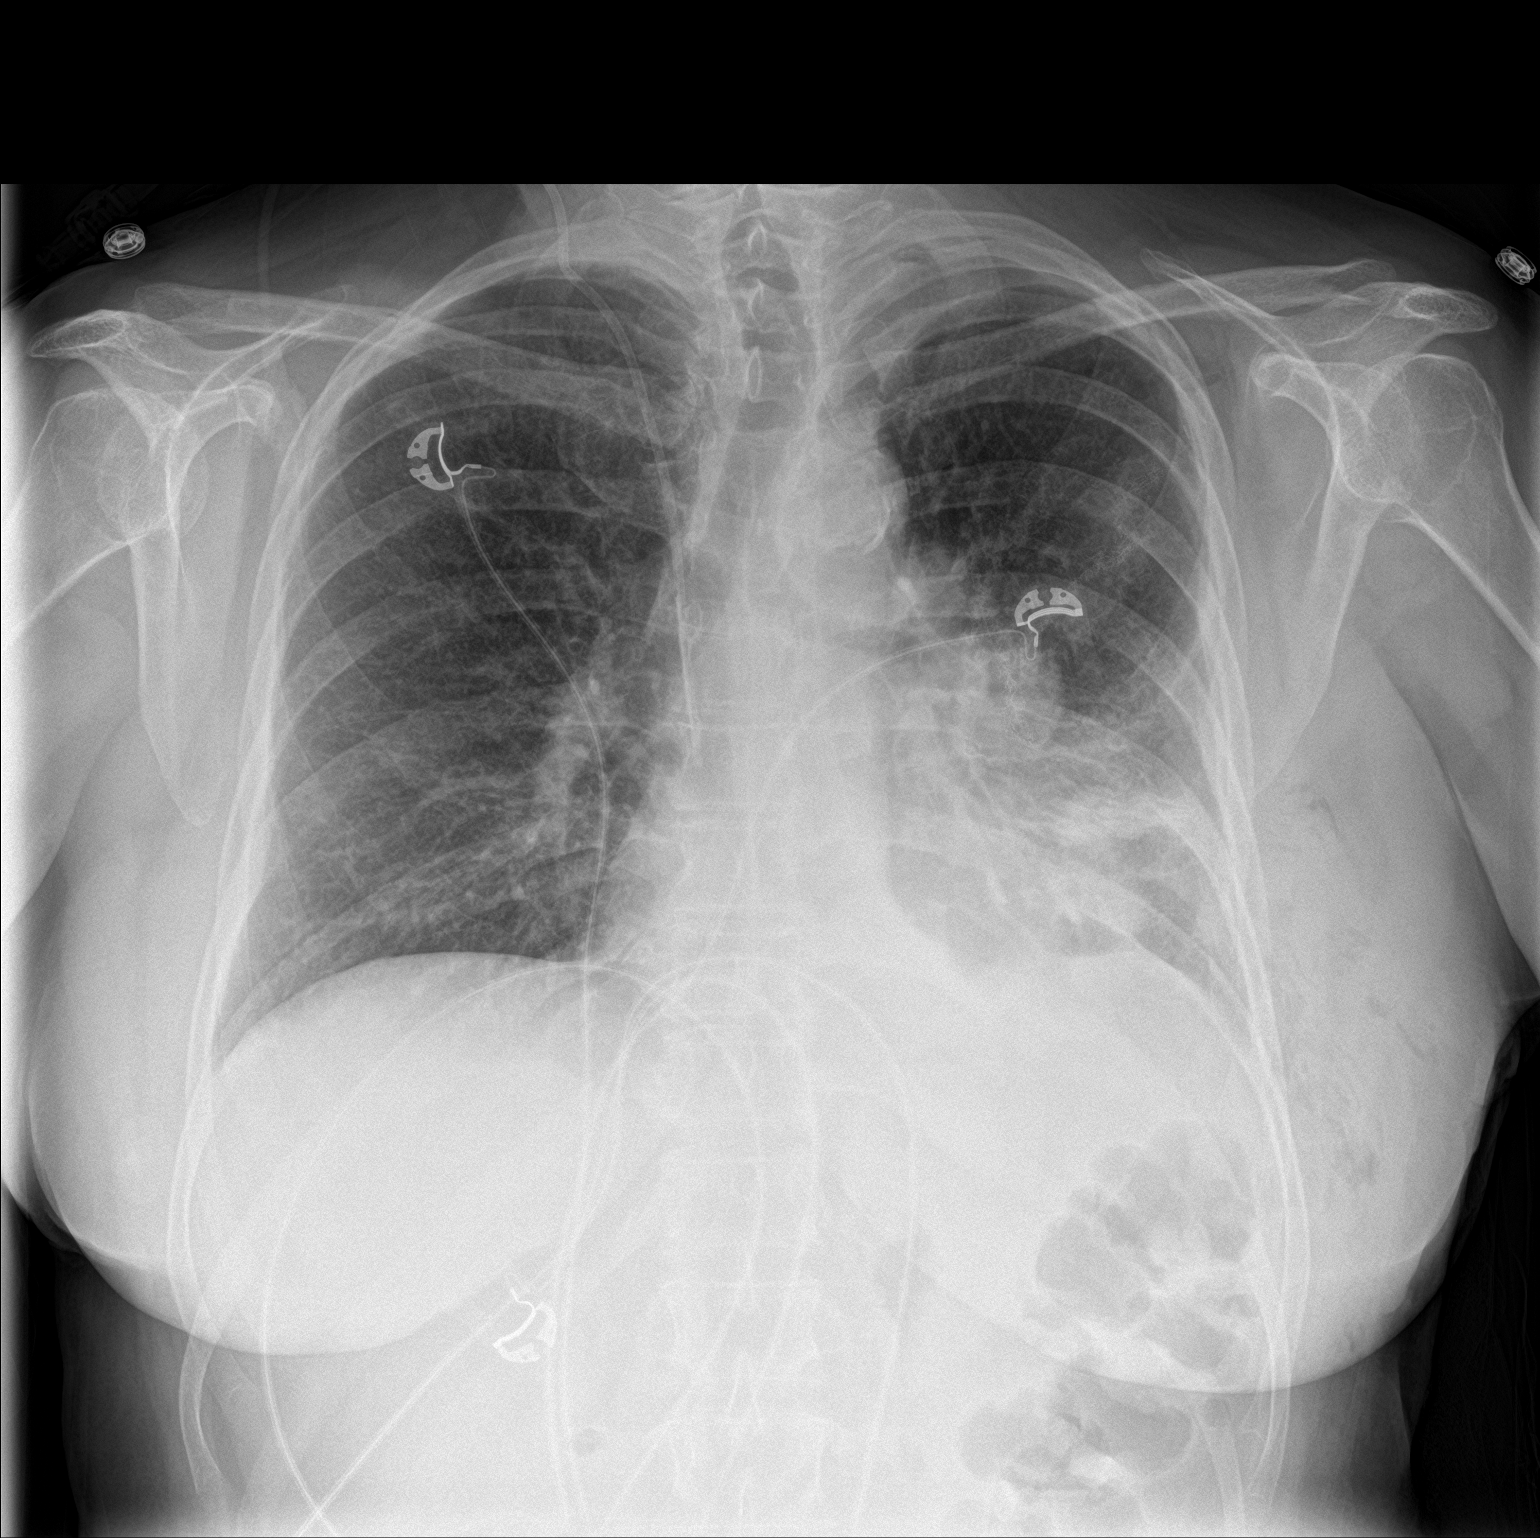

[chest lat]
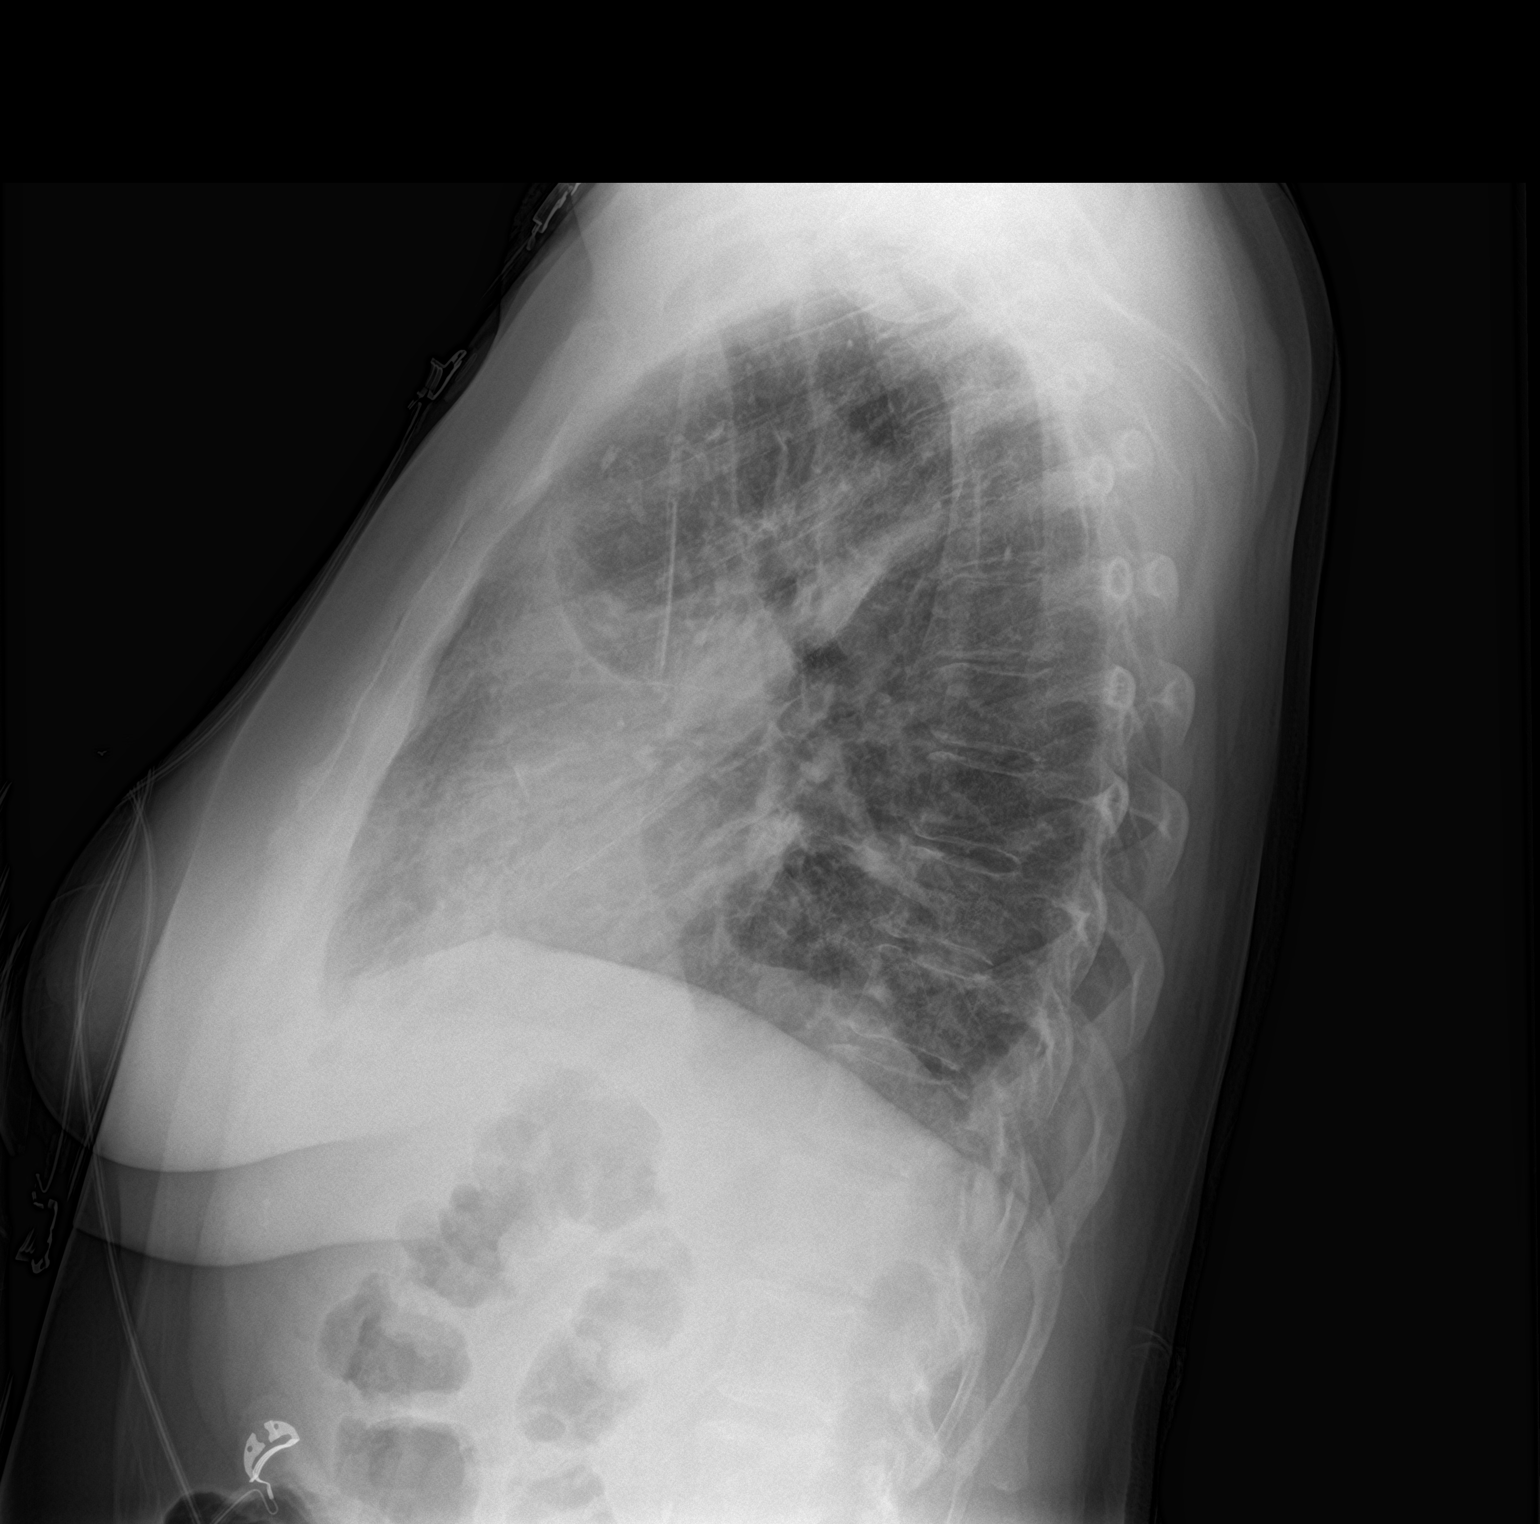

[2 of 2 positions shown; findings below may reference images not displayed]

FINDINGS: Chain sutures at the left hilum and in the mid upper lung zone.
Volume loss in the left hemithorax with atelectasis at the left lung
base, possible small volume left pleural fluid. No definite
pneumothorax. Subcutaneous emphysema in the left chest wall appears
similar. Right lung is clear. Right central line tip in the SVC.
Unchanged heart size and mediastinal contours. No pulmonary edema.
IMPRESSION: 1. Recent postsurgical change in the left hemithorax with residual
atelectasis and probable small effusion at the lung base. No
definite pneumothorax.
2. Unchanged subcutaneous emphysema in the left chest wall.

## 2020-08-30 DIAGNOSIS — H2511 Age-related nuclear cataract, right eye: Secondary | ICD-10-CM | POA: Diagnosis not present

## 2020-08-30 DIAGNOSIS — H2512 Age-related nuclear cataract, left eye: Secondary | ICD-10-CM | POA: Diagnosis not present

## 2020-09-03 ENCOUNTER — Telehealth: Payer: Self-pay | Admitting: Internal Medicine

## 2020-09-03 NOTE — Telephone Encounter (Signed)
Called pt per 10/28 sch msg - no answer. Left message for patient to call back to reschedule appt.

## 2020-09-09 DIAGNOSIS — H2511 Age-related nuclear cataract, right eye: Secondary | ICD-10-CM | POA: Diagnosis not present

## 2020-09-14 DIAGNOSIS — R197 Diarrhea, unspecified: Secondary | ICD-10-CM | POA: Diagnosis not present

## 2020-09-14 DIAGNOSIS — R1013 Epigastric pain: Secondary | ICD-10-CM | POA: Diagnosis not present

## 2020-09-14 DIAGNOSIS — Z23 Encounter for immunization: Secondary | ICD-10-CM | POA: Diagnosis not present

## 2020-09-17 ENCOUNTER — Encounter: Payer: Self-pay | Admitting: Internal Medicine

## 2020-09-20 ENCOUNTER — Emergency Department (HOSPITAL_COMMUNITY): Payer: PPO

## 2020-09-20 ENCOUNTER — Telehealth: Payer: Self-pay

## 2020-09-20 ENCOUNTER — Inpatient Hospital Stay (HOSPITAL_COMMUNITY)
Admission: EM | Admit: 2020-09-20 | Discharge: 2020-09-23 | DRG: 641 | Disposition: A | Discharge status: Hospice | Payer: PPO | Attending: Internal Medicine | Admitting: Internal Medicine

## 2020-09-20 ENCOUNTER — Other Ambulatory Visit: Payer: Self-pay

## 2020-09-20 ENCOUNTER — Encounter (HOSPITAL_COMMUNITY): Payer: Self-pay

## 2020-09-20 DIAGNOSIS — E785 Hyperlipidemia, unspecified: Secondary | ICD-10-CM | POA: Diagnosis not present

## 2020-09-20 DIAGNOSIS — K769 Liver disease, unspecified: Secondary | ICD-10-CM | POA: Diagnosis not present

## 2020-09-20 DIAGNOSIS — Z85118 Personal history of other malignant neoplasm of bronchus and lung: Secondary | ICD-10-CM | POA: Diagnosis not present

## 2020-09-20 DIAGNOSIS — E039 Hypothyroidism, unspecified: Secondary | ICD-10-CM | POA: Diagnosis present

## 2020-09-20 DIAGNOSIS — Z79899 Other long term (current) drug therapy: Secondary | ICD-10-CM

## 2020-09-20 DIAGNOSIS — M109 Gout, unspecified: Secondary | ICD-10-CM | POA: Diagnosis present

## 2020-09-20 DIAGNOSIS — Z809 Family history of malignant neoplasm, unspecified: Secondary | ICD-10-CM | POA: Diagnosis not present

## 2020-09-20 DIAGNOSIS — Z87891 Personal history of nicotine dependence: Secondary | ICD-10-CM | POA: Diagnosis not present

## 2020-09-20 DIAGNOSIS — K5730 Diverticulosis of large intestine without perforation or abscess without bleeding: Secondary | ICD-10-CM | POA: Diagnosis not present

## 2020-09-20 DIAGNOSIS — J439 Emphysema, unspecified: Secondary | ICD-10-CM | POA: Diagnosis not present

## 2020-09-20 DIAGNOSIS — G2581 Restless legs syndrome: Secondary | ICD-10-CM | POA: Diagnosis present

## 2020-09-20 DIAGNOSIS — E860 Dehydration: Secondary | ICD-10-CM | POA: Diagnosis present

## 2020-09-20 DIAGNOSIS — Z87442 Personal history of urinary calculi: Secondary | ICD-10-CM

## 2020-09-20 DIAGNOSIS — Z20822 Contact with and (suspected) exposure to covid-19: Secondary | ICD-10-CM | POA: Diagnosis present

## 2020-09-20 DIAGNOSIS — D539 Nutritional anemia, unspecified: Secondary | ICD-10-CM | POA: Diagnosis present

## 2020-09-20 DIAGNOSIS — R110 Nausea: Secondary | ICD-10-CM | POA: Diagnosis present

## 2020-09-20 DIAGNOSIS — R7401 Elevation of levels of liver transaminase levels: Secondary | ICD-10-CM | POA: Diagnosis not present

## 2020-09-20 DIAGNOSIS — E876 Hypokalemia: Secondary | ICD-10-CM | POA: Diagnosis not present

## 2020-09-20 DIAGNOSIS — M1990 Unspecified osteoarthritis, unspecified site: Secondary | ICD-10-CM | POA: Diagnosis not present

## 2020-09-20 DIAGNOSIS — J432 Centrilobular emphysema: Secondary | ICD-10-CM | POA: Diagnosis not present

## 2020-09-20 DIAGNOSIS — Z515 Encounter for palliative care: Secondary | ICD-10-CM | POA: Diagnosis not present

## 2020-09-20 DIAGNOSIS — Z6826 Body mass index (BMI) 26.0-26.9, adult: Secondary | ICD-10-CM

## 2020-09-20 DIAGNOSIS — E440 Moderate protein-calorie malnutrition: Secondary | ICD-10-CM | POA: Diagnosis not present

## 2020-09-20 DIAGNOSIS — Z923 Personal history of irradiation: Secondary | ICD-10-CM | POA: Diagnosis not present

## 2020-09-20 DIAGNOSIS — C771 Secondary and unspecified malignant neoplasm of intrathoracic lymph nodes: Secondary | ICD-10-CM | POA: Diagnosis present

## 2020-09-20 DIAGNOSIS — N3289 Other specified disorders of bladder: Secondary | ICD-10-CM | POA: Diagnosis not present

## 2020-09-20 DIAGNOSIS — K449 Diaphragmatic hernia without obstruction or gangrene: Secondary | ICD-10-CM | POA: Diagnosis not present

## 2020-09-20 DIAGNOSIS — Z66 Do not resuscitate: Secondary | ICD-10-CM | POA: Diagnosis not present

## 2020-09-20 DIAGNOSIS — R112 Nausea with vomiting, unspecified: Secondary | ICD-10-CM | POA: Diagnosis not present

## 2020-09-20 DIAGNOSIS — Z885 Allergy status to narcotic agent status: Secondary | ICD-10-CM

## 2020-09-20 DIAGNOSIS — C787 Secondary malignant neoplasm of liver and intrahepatic bile duct: Secondary | ICD-10-CM | POA: Diagnosis not present

## 2020-09-20 DIAGNOSIS — Z7189 Other specified counseling: Secondary | ICD-10-CM | POA: Diagnosis not present

## 2020-09-20 DIAGNOSIS — K219 Gastro-esophageal reflux disease without esophagitis: Secondary | ICD-10-CM | POA: Diagnosis not present

## 2020-09-20 DIAGNOSIS — Z9221 Personal history of antineoplastic chemotherapy: Secondary | ICD-10-CM | POA: Diagnosis not present

## 2020-09-20 DIAGNOSIS — Z888 Allergy status to other drugs, medicaments and biological substances status: Secondary | ICD-10-CM

## 2020-09-20 DIAGNOSIS — E7800 Pure hypercholesterolemia, unspecified: Secondary | ICD-10-CM | POA: Diagnosis not present

## 2020-09-20 DIAGNOSIS — R627 Adult failure to thrive: Secondary | ICD-10-CM | POA: Diagnosis present

## 2020-09-20 DIAGNOSIS — C3492 Malignant neoplasm of unspecified part of left bronchus or lung: Secondary | ICD-10-CM | POA: Diagnosis not present

## 2020-09-20 DIAGNOSIS — R2681 Unsteadiness on feet: Secondary | ICD-10-CM | POA: Diagnosis present

## 2020-09-20 LAB — URINALYSIS, ROUTINE W REFLEX MICROSCOPIC
Glucose, UA: NEGATIVE mg/dL
Hgb urine dipstick: NEGATIVE
Ketones, ur: 20 mg/dL — AB
Leukocytes,Ua: NEGATIVE
Nitrite: NEGATIVE
Protein, ur: 30 mg/dL — AB
Specific Gravity, Urine: 1.026 (ref 1.005–1.030)
pH: 5 (ref 5.0–8.0)

## 2020-09-20 LAB — COMPREHENSIVE METABOLIC PANEL
ALT: 69 U/L — ABNORMAL HIGH (ref 0–44)
AST: 98 U/L — ABNORMAL HIGH (ref 15–41)
Albumin: 3.8 g/dL (ref 3.5–5.0)
Alkaline Phosphatase: 236 U/L — ABNORMAL HIGH (ref 38–126)
Anion gap: 12 (ref 5–15)
BUN: 15 mg/dL (ref 8–23)
CO2: 22 mmol/L (ref 22–32)
Calcium: 10 mg/dL (ref 8.9–10.3)
Chloride: 105 mmol/L (ref 98–111)
Creatinine, Ser: 0.92 mg/dL (ref 0.44–1.00)
GFR, Estimated: >60 mL/min (ref >60)
Glucose, Bld: 104 mg/dL — ABNORMAL HIGH (ref 70–99)
Potassium: 4 mmol/L (ref 3.5–5.1)
Sodium: 139 mmol/L (ref 135–145)
Total Bilirubin: 0.8 mg/dL (ref 0.3–1.2)
Total Protein: 7.1 g/dL (ref 6.5–8.1)

## 2020-09-20 LAB — CBC
HCT: 34.9 % — ABNORMAL LOW (ref 36.0–46.0)
Hemoglobin: 11.6 g/dL — ABNORMAL LOW (ref 12.0–15.0)
MCH: 34 pg (ref 26.0–34.0)
MCHC: 33.2 g/dL (ref 30.0–36.0)
MCV: 102.3 fL — ABNORMAL HIGH (ref 80.0–100.0)
Platelets: 233 10*3/uL (ref 150–400)
RBC: 3.41 MIL/uL — ABNORMAL LOW (ref 3.87–5.11)
RDW: 12.5 % (ref 11.5–15.5)
WBC: 9 10*3/uL (ref 4.0–10.5)
nRBC: 0 % (ref 0.0–0.2)

## 2020-09-20 LAB — RESPIRATORY PANEL BY RT PCR (FLU A&B, COVID)
Influenza A by PCR: NEGATIVE
Influenza B by PCR: NEGATIVE
SARS Coronavirus 2 by RT PCR: NEGATIVE

## 2020-09-20 MED ORDER — ONDANSETRON HCL 4 MG PO TABS: 4.0000 mg | ORAL_TABLET | Freq: Four times a day (QID) | ORAL | Status: DC | PRN

## 2020-09-20 MED ORDER — LACTATED RINGERS IV BOLUS
1000.0000 mL | Freq: Once | INTRAVENOUS | Status: AC
  Administered 2020-09-20: 1000 mL via INTRAVENOUS

## 2020-09-20 MED ORDER — ONDANSETRON HCL 4 MG/2ML IJ SOLN
4.0000 mg | Freq: Once | INTRAMUSCULAR | Status: AC
  Administered 2020-09-20: 4 mg via INTRAVENOUS
  Filled 2020-09-20: qty 2

## 2020-09-20 MED ORDER — ESCITALOPRAM OXALATE 10 MG PO TABS
20.0000 mg | ORAL_TABLET | Freq: Every day | ORAL | Status: DC
  Administered 2020-09-21 – 2020-09-23 (×3): 20 mg via ORAL
  Filled 2020-09-20 (×3): qty 2

## 2020-09-20 MED ORDER — DIFLUPREDNATE 0.05 % OP EMUL: 1.0000 [drp] | Freq: Three times a day (TID) | OPHTHALMIC | Status: DC | PRN

## 2020-09-20 MED ORDER — PANTOPRAZOLE SODIUM 40 MG PO TBEC
40.0000 mg | DELAYED_RELEASE_TABLET | Freq: Every day | ORAL | Status: DC
  Administered 2020-09-20 – 2020-09-23 (×4): 40 mg via ORAL
  Filled 2020-09-20 (×4): qty 1

## 2020-09-20 MED ORDER — TRAMADOL HCL 50 MG PO TABS
50.0000 mg | ORAL_TABLET | Freq: Three times a day (TID) | ORAL | Status: DC | PRN
  Administered 2020-09-21 – 2020-09-22 (×2): 50 mg via ORAL
  Filled 2020-09-20 (×2): qty 1

## 2020-09-20 MED ORDER — POLYETHYLENE GLYCOL 3350 17 G PO PACK: 17.0000 g | PACK | Freq: Every day | ORAL | Status: DC | PRN

## 2020-09-20 MED ORDER — GATIFLOXACIN 0.5 % OP SOLN
1.0000 [drp] | Freq: Four times a day (QID) | OPHTHALMIC | Status: DC
  Administered 2020-09-20 – 2020-09-21 (×2): 1 [drp] via OPHTHALMIC
  Filled 2020-09-20: qty 2.5

## 2020-09-20 MED ORDER — IOHEXOL 300 MG/ML  SOLN
100.0000 mL | Freq: Once | INTRAMUSCULAR | Status: AC | PRN
  Administered 2020-09-20: 100 mL via INTRAVENOUS

## 2020-09-20 MED ORDER — ONDANSETRON HCL 4 MG/2ML IJ SOLN
4.0000 mg | Freq: Four times a day (QID) | INTRAMUSCULAR | Status: DC | PRN
  Administered 2020-09-21 – 2020-09-22 (×4): 4 mg via INTRAVENOUS
  Filled 2020-09-20 (×4): qty 2

## 2020-09-20 MED ORDER — DEXTROSE-NACL 5-0.45 % IV SOLN: INTRAVENOUS | Status: AC

## 2020-09-20 MED ORDER — DIAZEPAM 5 MG PO TABS
5.0000 mg | ORAL_TABLET | Freq: Three times a day (TID) | ORAL | Status: DC | PRN
  Filled 2020-09-20: qty 1

## 2020-09-20 NOTE — ED Notes (Signed)
Report called to [employer].
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
## 2020-09-20 NOTE — Telephone Encounter (Signed)
Pt called stating she is having severe epigastric pain and feels she is in complete distress. Pt feels she needs to be admitted to the hospital. Pt was advised to go to the ED for assessment if she is having 10/10 pain. Pt was also advised admission will be determined by the ED providers. Se expressed understanding of this information and advised she wants to go to the ED.

## 2020-09-20 NOTE — ED Triage Notes (Signed)
Pt arrived via walk in, c/o n/v and diarrhea x3 weeks. Generalized wkns. Denies any fevers/chills or sick contacts.
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
## 2020-09-20 NOTE — ED Provider Notes (Signed)
  Provider Note MRN:  [id number]  Arrival date & time: 09/20/20    ED Course and Medical Decision Making  Assumed care from Dr. Rex Kras at shift change.  Worsening nausea vomiting over the past several weeks, known lung cancer, awaiting CT imaging.  If tolerating p.o. and CT imaging is overall reassuring, patient would be a candidate for discharge.  CT imaging reveals innumerable metastatic lesions to the liver.  These findings discussed with patient.  She lives alone and is currently feeling too unwell to care for self.  Will admit to hospitalist service and consult oncology.  Procedures  Final Clinical Impressions(s) / ED Diagnoses     ICD-10-CM   1. 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 Sedonia Small, [place] [email or web address]    Maudie Flakes, MD 09/20/20 [phone]

## 2020-09-21 ENCOUNTER — Inpatient Hospital Stay: Payer: PPO | Attending: Internal Medicine

## 2020-09-21 ENCOUNTER — Ambulatory Visit (HOSPITAL_COMMUNITY): Payer: PPO

## 2020-09-21 DIAGNOSIS — Z87442 Personal history of urinary calculi: Secondary | ICD-10-CM | POA: Insufficient documentation

## 2020-09-21 DIAGNOSIS — C787 Secondary malignant neoplasm of liver and intrahepatic bile duct: Secondary | ICD-10-CM | POA: Diagnosis present

## 2020-09-21 DIAGNOSIS — Z85118 Personal history of other malignant neoplasm of bronchus and lung: Secondary | ICD-10-CM | POA: Diagnosis not present

## 2020-09-21 DIAGNOSIS — R112 Nausea with vomiting, unspecified: Secondary | ICD-10-CM | POA: Insufficient documentation

## 2020-09-21 DIAGNOSIS — Z20822 Contact with and (suspected) exposure to covid-19: Secondary | ICD-10-CM | POA: Diagnosis present

## 2020-09-21 DIAGNOSIS — Z7189 Other specified counseling: Secondary | ICD-10-CM | POA: Diagnosis not present

## 2020-09-21 DIAGNOSIS — I2510 Atherosclerotic heart disease of native coronary artery without angina pectoris: Secondary | ICD-10-CM | POA: Insufficient documentation

## 2020-09-21 DIAGNOSIS — Z888 Allergy status to other drugs, medicaments and biological substances status: Secondary | ICD-10-CM | POA: Diagnosis not present

## 2020-09-21 DIAGNOSIS — R42 Dizziness and giddiness: Secondary | ICD-10-CM | POA: Insufficient documentation

## 2020-09-21 DIAGNOSIS — G2581 Restless legs syndrome: Secondary | ICD-10-CM | POA: Diagnosis present

## 2020-09-21 DIAGNOSIS — J432 Centrilobular emphysema: Secondary | ICD-10-CM | POA: Diagnosis present

## 2020-09-21 DIAGNOSIS — M1990 Unspecified osteoarthritis, unspecified site: Secondary | ICD-10-CM | POA: Insufficient documentation

## 2020-09-21 DIAGNOSIS — C771 Secondary and unspecified malignant neoplasm of intrathoracic lymph nodes: Secondary | ICD-10-CM | POA: Diagnosis present

## 2020-09-21 DIAGNOSIS — Z79899 Other long term (current) drug therapy: Secondary | ICD-10-CM | POA: Insufficient documentation

## 2020-09-21 DIAGNOSIS — K219 Gastro-esophageal reflux disease without esophagitis: Secondary | ICD-10-CM | POA: Diagnosis present

## 2020-09-21 DIAGNOSIS — Z885 Allergy status to narcotic agent status: Secondary | ICD-10-CM | POA: Diagnosis not present

## 2020-09-21 DIAGNOSIS — E785 Hyperlipidemia, unspecified: Secondary | ICD-10-CM | POA: Diagnosis present

## 2020-09-21 DIAGNOSIS — Z8719 Personal history of other diseases of the digestive system: Secondary | ICD-10-CM | POA: Insufficient documentation

## 2020-09-21 DIAGNOSIS — E440 Moderate protein-calorie malnutrition: Secondary | ICD-10-CM | POA: Diagnosis present

## 2020-09-21 DIAGNOSIS — F419 Anxiety disorder, unspecified: Secondary | ICD-10-CM | POA: Insufficient documentation

## 2020-09-21 DIAGNOSIS — Z809 Family history of malignant neoplasm, unspecified: Secondary | ICD-10-CM | POA: Diagnosis not present

## 2020-09-21 DIAGNOSIS — E039 Hypothyroidism, unspecified: Secondary | ICD-10-CM | POA: Diagnosis present

## 2020-09-21 DIAGNOSIS — C3432 Malignant neoplasm of lower lobe, left bronchus or lung: Secondary | ICD-10-CM | POA: Insufficient documentation

## 2020-09-21 DIAGNOSIS — Z9221 Personal history of antineoplastic chemotherapy: Secondary | ICD-10-CM | POA: Diagnosis not present

## 2020-09-21 DIAGNOSIS — D539 Nutritional anemia, unspecified: Secondary | ICD-10-CM | POA: Diagnosis not present

## 2020-09-21 DIAGNOSIS — R627 Adult failure to thrive: Secondary | ICD-10-CM | POA: Diagnosis not present

## 2020-09-21 DIAGNOSIS — Z87891 Personal history of nicotine dependence: Secondary | ICD-10-CM | POA: Diagnosis not present

## 2020-09-21 DIAGNOSIS — E7800 Pure hypercholesterolemia, unspecified: Secondary | ICD-10-CM | POA: Diagnosis present

## 2020-09-21 DIAGNOSIS — I700 Atherosclerosis of aorta: Secondary | ICD-10-CM | POA: Insufficient documentation

## 2020-09-21 DIAGNOSIS — E860 Dehydration: Secondary | ICD-10-CM | POA: Diagnosis not present

## 2020-09-21 DIAGNOSIS — Z923 Personal history of irradiation: Secondary | ICD-10-CM | POA: Diagnosis not present

## 2020-09-21 DIAGNOSIS — J439 Emphysema, unspecified: Secondary | ICD-10-CM | POA: Insufficient documentation

## 2020-09-21 DIAGNOSIS — Z66 Do not resuscitate: Secondary | ICD-10-CM | POA: Diagnosis not present

## 2020-09-21 DIAGNOSIS — I10 Essential (primary) hypertension: Secondary | ICD-10-CM | POA: Insufficient documentation

## 2020-09-21 DIAGNOSIS — Z515 Encounter for palliative care: Secondary | ICD-10-CM | POA: Diagnosis not present

## 2020-09-21 DIAGNOSIS — R0609 Other forms of dyspnea: Secondary | ICD-10-CM | POA: Insufficient documentation

## 2020-09-21 DIAGNOSIS — M109 Gout, unspecified: Secondary | ICD-10-CM | POA: Diagnosis present

## 2020-09-21 LAB — IRON AND TIBC
Iron: 68 ug/dL (ref 28–170)
Saturation Ratios: 32 % — ABNORMAL HIGH (ref 10.4–31.8)
TIBC: 215 ug/dL — ABNORMAL LOW (ref 250–450)
UIBC: 147 ug/dL

## 2020-09-21 LAB — CBC
HCT: 32.7 % — ABNORMAL LOW (ref 36.0–46.0)
Hemoglobin: 10.7 g/dL — ABNORMAL LOW (ref 12.0–15.0)
MCH: 34 pg (ref 26.0–34.0)
MCHC: 32.7 g/dL (ref 30.0–36.0)
MCV: 103.8 fL — ABNORMAL HIGH (ref 80.0–100.0)
Platelets: 210 10*3/uL (ref 150–400)
RBC: 3.15 MIL/uL — ABNORMAL LOW (ref 3.87–5.11)
RDW: 12.7 % (ref 11.5–15.5)
WBC: 7.4 10*3/uL (ref 4.0–10.5)
nRBC: 0 % (ref 0.0–0.2)

## 2020-09-21 LAB — COMPREHENSIVE METABOLIC PANEL
ALT: 65 U/L — ABNORMAL HIGH (ref 0–44)
AST: 95 U/L — ABNORMAL HIGH (ref 15–41)
Albumin: 3.5 g/dL (ref 3.5–5.0)
Alkaline Phosphatase: 204 U/L — ABNORMAL HIGH (ref 38–126)
Anion gap: 10 (ref 5–15)
BUN: 11 mg/dL (ref 8–23)
CO2: 21 mmol/L — ABNORMAL LOW (ref 22–32)
Calcium: 9 mg/dL (ref 8.9–10.3)
Chloride: 106 mmol/L (ref 98–111)
Creatinine, Ser: 0.84 mg/dL (ref 0.44–1.00)
GFR, Estimated: >60 mL/min (ref >60)
Glucose, Bld: 117 mg/dL — ABNORMAL HIGH (ref 70–99)
Potassium: 3.4 mmol/L — ABNORMAL LOW (ref 3.5–5.1)
Sodium: 137 mmol/L (ref 135–145)
Total Bilirubin: 0.6 mg/dL (ref 0.3–1.2)
Total Protein: 6.6 g/dL (ref 6.5–8.1)

## 2020-09-21 LAB — TSH: TSH: 4.408 u[IU]/mL (ref 0.350–4.500)

## 2020-09-21 LAB — VITAMIN B12: Vitamin B-12: 724 pg/mL (ref 180–914)

## 2020-09-21 LAB — FOLATE: Folate: 20.9 ng/mL (ref >5.9)

## 2020-09-21 LAB — PROTIME-INR
INR: 1.1 (ref 0.8–1.2)
Prothrombin Time: 14 seconds (ref 11.4–15.2)

## 2020-09-21 LAB — RETICULOCYTES
Immature Retic Fract: 10.9 % (ref 2.3–15.9)
RBC.: 3.12 MIL/uL — ABNORMAL LOW (ref 3.87–5.11)
Retic Count, Absolute: 30.3 10*3/uL (ref 19.0–186.0)
Retic Ct Pct: 1 % (ref 0.4–3.1)

## 2020-09-21 LAB — FERRITIN: Ferritin: 296 ng/mL (ref 11–307)

## 2020-09-21 MED ORDER — ENOXAPARIN SODIUM 40 MG/0.4ML ~~LOC~~ SOLN
40.0000 mg | SUBCUTANEOUS | Status: DC
  Administered 2020-09-21 – 2020-09-23 (×3): 40 mg via SUBCUTANEOUS
  Filled 2020-09-21 (×3): qty 0.4

## 2020-09-21 MED ORDER — DEXTROSE-NACL 5-0.45 % IV SOLN: INTRAVENOUS | Status: AC

## 2020-09-21 MED ORDER — POTASSIUM CHLORIDE CRYS ER 20 MEQ PO TBCR
40.0000 meq | EXTENDED_RELEASE_TABLET | Freq: Once | ORAL | Status: AC
  Administered 2020-09-21: 40 meq via ORAL
  Filled 2020-09-21: qty 2

## 2020-09-21 MED ORDER — KETOROLAC TROMETHAMINE 0.5 % OP SOLN
1.0000 [drp] | Freq: Every day | OPHTHALMIC | Status: DC
  Administered 2020-09-21 – 2020-09-22 (×2): 1 [drp] via OPHTHALMIC
  Filled 2020-09-21: qty 5

## 2020-09-21 NOTE — Progress Notes (Signed)
[facility] PROGRESS NOTE   Andrea Kline:[id number] DOB: [birth year] DOA: 09/20/2020  PCP: [name], MD  Brief History/Interval Summary: 64 y.o. 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  She has had few episodes of vomiting as well.  Occasional diarrhea.  Complains of upper abdominal pain which is significant only when somebody is pushing on it.  Otherwise is about 3 out of 10 in intensity.  Denies any radiation of the pain.  Denies any blood in her emesis.  Denies any fever or chills.  Denies any passing out episodes although she has come close to it.  Has felt unsteady on her feet the last several days and has almost fallen down.  Denies any injuries.  Patient lives with herself.  She saw her primary care physician recently who stopped her Celebrex and statin medication.    In the emergency department patient was noted to be fatigued, dehydrated.  Noted to have transaminitis.  She underwent CT scan of chest abdomen pelvis which raises concern for new metastatic lesions in her liver.  Patient lives by herself.  Unable to ambulate.  Will be brought in for further management.  Reason for Visit: Failure to thrive.  New liver metastases.  Consultants: Medical oncology  Procedures: None  Antibiotics: Anti-infectives (From admission, onward)   None      Subjective/Interval History: Patient continues to have pain in her right upper abdomen along with nausea.  Has not been able to eat or drink anything due to her symptoms.  Continues to be fatigued.  Has not been able to get out of the bed.     Assessment/Plan:  Failure to thrive with dehydration and nausea and vomiting Symptoms most likely due to new  metastatic disease noted on imaging studies.  She is noted to have liver lesions.  Patient continues to have pain in the right upper quadrant.  PT and OT evaluation.  Continue with IV fluids.  She has had very poor oral intake due to her symptoms.  Control her nausea.  Patient lives by herself and is not considered safe to go back to being by herself at this time.  She has had near falls.  History of lung cancer, both adenocarcinoma and small cell Followed by Dr. [name].  Also seen by Dr. Isidore Moos with radiation oncology.  Noted to have new lesions in the liver concerning for metastatic process.  Await oncology input.  PT/INR normal.  LFTs are stable.  MRI brain done in August did not show any metastatic lesions at that time.  Macrocytic anemia Hemoglobin stable for the most part.  Anemia panel reviewed.  No clear deficiencies identified.  TSH is noted to be normal.  History of vertigo Uses Valium as needed.  DVT Prophylaxis: Will initiate Lovenox Code Status: Full code Family Communication: Discussed with the patient. Disposition Plan: PT and OT evaluation pending.  May need rehab.  Status is: Observation  The patient will require care spanning > 2 midnights and should be moved to inpatient because: Ongoing active pain requiring inpatient pain management, Unsafe d/c plan, Inpatient level of care appropriate due to severity of illness and Nausea and vomiting.  Unsteady gait.  Dispo: The patient is from: Home  Anticipated d/c is to: To be determined              Anticipated d/c date is: 2 days              Patient currently is not medically stable to d/c.       Medications:  Scheduled: . escitalopram  20 mg Oral Q1200  . gatifloxacin  1 drop Right Eye QID  . pantoprazole  40 mg Oral Daily   Continuous: . dextrose 5 % and 0.45% NaCl 100 mL/hr at 09/21/20 0748   LNL:GXQJJHER, Difluprednate, ondansetron **OR** ondansetron (ZOFRAN) IV, polyethylene glycol,  traMADol   Objective:  Vital Signs  Vitals:   09/20/20 2213 09/21/20 0141 09/21/20 0455 09/21/20 0819  BP:  131/70 132/68 109/63  Pulse:  86 86 81  Resp:  15 14 16   Temp:  99.3 F (37.4 C) 99.1 F (37.3 C) 98.4 F (36.9 C)  TempSrc:  Oral Oral Oral  SpO2:  100% 98% 97%  Weight: 72.5 kg     Height: 5\' 5"  (1.651 m)       Intake/Output Summary (Last 24 hours) at 09/21/2020 1000 Last data filed at 09/21/2020 0600 Gross per 24 hour  Intake 920 ml  Output 600 ml  Net 320 ml   Filed Weights   09/20/20 2213  Weight: 72.5 kg    General appearance: Noted to be lethargic.  Easily arousable.  Very fatigued. Resp: Clear to auscultation bilaterally.  Normal effort Cardio: S1-S2 is normal regular.  No S3-S4.  No rubs murmurs or bruit GI: Abdomen is soft.  Tender in the right upper quadrant without any rebound rigidity or guarding.  Bowel sounds present normal.  No masses.  Liver edge is palpable.  Extremities: No edema.   Neurologic: Alert and oriented x3.  No focal neurological deficits.    Lab Results:  Data Reviewed: I have personally reviewed following labs and imaging studies  CBC: Recent Labs  Lab 09/20/20 1320 09/21/20 0532  WBC 9.0 7.4  HGB 11.6* 10.7*  HCT 34.9* 32.7*  MCV 102.3* 103.8*  PLT 233 740    Basic Metabolic Panel: Recent Labs  Lab 09/20/20 1320 09/21/20 0532  NA 139 137  K 4.0 3.4*  CL 105 106  CO2 22 21*  GLUCOSE 104* 117*  BUN 15 11  CREATININE 0.92 0.84  CALCIUM 10.0 9.0    GFR: Estimated Creatinine Clearance: 68.4 mL/min (by C-G formula based on SCr of 0.84 mg/dL).  Liver Function Tests: Recent Labs  Lab 09/20/20 1320 09/21/20 0532  AST 98* 95*  ALT 69* 65*  ALKPHOS 236* 204*  BILITOT 0.8 0.6  PROT 7.1 6.6  ALBUMIN 3.8 3.5     Coagulation Profile: Recent Labs  Lab 09/21/20 0532  INR 1.1     Thyroid Function Tests: Recent Labs    09/21/20 0532  TSH 4.408    Anemia Panel: Recent Labs    09/21/20 0532   VITAMINB12 724  FOLATE 20.9  FERRITIN 296  TIBC 215*  IRON 68  RETICCTPCT 1.0    Recent Results (from the past 240 hour(s))  Respiratory Panel by RT PCR (Flu A&B, Covid) - Nasopharyngeal Swab     Status: None   Collection Time: 09/20/20  6:01 PM   Specimen: Nasopharyngeal Swab  Result Value Ref Range Status   SARS Coronavirus 2 by RT PCR NEGATIVE NEGATIVE Final    Comment: (NOTE) SARS-CoV-2 target nucleic acids are NOT DETECTED.  The SARS-CoV-2 RNA is  generally detectable in upper respiratoy specimens during the acute phase of infection. The lowest concentration of SARS-CoV-2 viral copies this assay can detect is 131 copies/mL. A negative result does not preclude SARS-Cov-2 infection and should not be used as the sole basis for treatment or other patient management decisions. A negative result may occur with  improper specimen collection/handling, submission of specimen other than nasopharyngeal swab, presence of viral mutation(s) within the areas targeted by this assay, and inadequate number of viral copies (<131 copies/mL). A negative result must be combined with clinical observations, patient history, and epidemiological information. The expected result is Negative.  Fact Sheet for Patients:  PinkCheek.be  Fact Sheet for Healthcare Providers:  GravelBags.it  This test is no t yet approved or cleared by the [place] FDA and  has been authorized for detection and/or diagnosis of SARS-CoV-2 by FDA under an Emergency Use Authorization (EUA). This EUA will remain  in effect (meaning this test can be used) for the duration of the COVID-19 declaration under Section 564(b)(1) of the Act, 21 U.S.C. section 360bbb-3(b)(1), unless the authorization is terminated or revoked sooner.     Influenza A by PCR NEGATIVE NEGATIVE Final   Influenza B by PCR NEGATIVE NEGATIVE Final    Comment: (NOTE) The Xpert Xpress  SARS-CoV-2/FLU/RSV assay is intended as an aid in  the diagnosis of influenza from Nasopharyngeal swab specimens and  should not be used as a sole basis for treatment. Nasal washings and  aspirates are unacceptable for Xpert Xpress SARS-CoV-2/FLU/RSV  testing.  Fact Sheet for Patients: PinkCheek.be  Fact Sheet for Healthcare Providers: GravelBags.it  This test is not yet approved or cleared by the [place] FDA and  has been authorized for detection and/or diagnosis of SARS-CoV-2 by  FDA under an Emergency Use Authorization (EUA). This EUA will remain  in effect (meaning this test can be used) for the duration of the  Covid-19 declaration under Section 564(b)(1) of the Act, 21  U.S.C. section 360bbb-3(b)(1), unless the authorization is  terminated or revoked. Performed at [facility], [place] [address], [place], [place] [address]       Radiology Studies: CT Chest W Contrast  Result Date: 09/20/2020 CLINICAL DATA:  Metastatic left lung cancer, assess treatment response, abdominal pain, weakness, nausea, vomiting, diarrhea EXAM: CT CHEST, ABDOMEN, AND PELVIS WITH CONTRAST TECHNIQUE: Multidetector CT imaging of the chest, abdomen and pelvis was performed following the standard protocol during bolus administration of intravenous contrast. CONTRAST:  179mL OMNIPAQUE IOHEXOL 300 MG/ML SOLN, additional oral enteric contrast COMPARISON:  PET-CT, 05/23/2020 FINDINGS: CT CHEST FINDINGS Cardiovascular: Aortic atherosclerosis. Normal heart size. Three-vessel coronary artery calcifications. No pericardial effusion. Mediastinum/Nodes: No enlarged mediastinal, hilar, or axillary lymph nodes. Small hiatal hernia. Previously noted enlarged prevascular lymph nodes are resolved, measuring no greater than 4-5 mm (series 2, image 23). Thyroid gland, trachea, and esophagus demonstrate no significant findings. Lungs/Pleura: Mild,  predominantly paraseptal emphysema. Redemonstrated postoperative findings of wedge resection of the lingula (series 4, image 74) and left lower lobe (series 4, image 100). No pleural effusion or pneumothorax. Musculoskeletal: No chest wall mass or suspicious bone lesions identified. CT ABDOMEN PELVIS FINDINGS Hepatobiliary: There are innumerable new, hypodense liver lesions of varying sizes, largest, index lesion of the peripheral right lobe of the liver measuring 4.2 x 3.3 cm (series 2, image 58). No gallstones, gallbladder wall thickening, or biliary dilatation. Pancreas: Unremarkable. No pancreatic ductal dilatation or surrounding inflammatory changes. Spleen: Normal in size without significant abnormality. Adrenals/Urinary  Tract: Adrenal glands are unremarkable. Kidneys are normal, without renal calculi, solid lesion, or hydronephrosis. Thickening of the urinary bladder. Stomach/Bowel: Stomach is within normal limits. Appendix appears normal. No evidence of bowel wall thickening, distention, or inflammatory changes. Descending and sigmoid diverticulosis. Vascular/Lymphatic: Aortic atherosclerosis. No enlarged abdominal or pelvic lymph nodes. Reproductive: No mass or other abnormality. Other: No abdominal wall hernia or abnormality. No abdominopelvic ascites. Musculoskeletal: No acute or significant osseous findings. IMPRESSION: 1. There are innumerable new, hypodense liver lesions of varying sizes, consistent with new hepatic metastatic disease. 2. Previously noted enlarged prevascular lymph nodes are resolved, measuring no greater than 4-5 mm, presumably reflecting response of metastatic lymph nodes to radiation therapy. 3. Redemonstrated postoperative findings of wedge resection of the lingula and left lower lobe. 4. Thickening of the urinary bladder, nonspecific but suggestive of infectious or inflammatory cystitis. Correlate with urinalysis. Aortic Atherosclerosis (ICD10-I70.0) and Emphysema (ICD10-J43.9).  Electronically Signed   By: Eddie Candle M.D.   On: 09/20/2020 17:20   CT Abdomen Pelvis W Contrast  Result Date: 09/20/2020 CLINICAL DATA:  Metastatic left lung cancer, assess treatment response, abdominal pain, weakness, nausea, vomiting, diarrhea EXAM: CT CHEST, ABDOMEN, AND PELVIS WITH CONTRAST TECHNIQUE: Multidetector CT imaging of the chest, abdomen and pelvis was performed following the standard protocol during bolus administration of intravenous contrast. CONTRAST:  177mL OMNIPAQUE IOHEXOL 300 MG/ML SOLN, additional oral enteric contrast COMPARISON:  PET-CT, 05/23/2020 FINDINGS: CT CHEST FINDINGS Cardiovascular: Aortic atherosclerosis. Normal heart size. Three-vessel coronary artery calcifications. No pericardial effusion. Mediastinum/Nodes: No enlarged mediastinal, hilar, or axillary lymph nodes. Small hiatal hernia. Previously noted enlarged prevascular lymph nodes are resolved, measuring no greater than 4-5 mm (series 2, image 23). Thyroid gland, trachea, and esophagus demonstrate no significant findings. Lungs/Pleura: Mild, predominantly paraseptal emphysema. Redemonstrated postoperative findings of wedge resection of the lingula (series 4, image 74) and left lower lobe (series 4, image 100). No pleural effusion or pneumothorax. Musculoskeletal: No chest wall mass or suspicious bone lesions identified. CT ABDOMEN PELVIS FINDINGS Hepatobiliary: There are innumerable new, hypodense liver lesions of varying sizes, largest, index lesion of the peripheral right lobe of the liver measuring 4.2 x 3.3 cm (series 2, image 58). No gallstones, gallbladder wall thickening, or biliary dilatation. Pancreas: Unremarkable. No pancreatic ductal dilatation or surrounding inflammatory changes. Spleen: Normal in size without significant abnormality. Adrenals/Urinary Tract: Adrenal glands are unremarkable. Kidneys are normal, without renal calculi, solid lesion, or hydronephrosis. Thickening of the urinary bladder.  Stomach/Bowel: Stomach is within normal limits. Appendix appears normal. No evidence of bowel wall thickening, distention, or inflammatory changes. Descending and sigmoid diverticulosis. Vascular/Lymphatic: Aortic atherosclerosis. No enlarged abdominal or pelvic lymph nodes. Reproductive: No mass or other abnormality. Other: No abdominal wall hernia or abnormality. No abdominopelvic ascites. Musculoskeletal: No acute or significant osseous findings. IMPRESSION: 1. There are innumerable new, hypodense liver lesions of varying sizes, consistent with new hepatic metastatic disease. 2. Previously noted enlarged prevascular lymph nodes are resolved, measuring no greater than 4-5 mm, presumably reflecting response of metastatic lymph nodes to radiation therapy. 3. Redemonstrated postoperative findings of wedge resection of the lingula and left lower lobe. 4. Thickening of the urinary bladder, nonspecific but suggestive of infectious or inflammatory cystitis. Correlate with urinalysis. Aortic Atherosclerosis (ICD10-I70.0) and Emphysema (ICD10-J43.9). Electronically Signed   By: Eddie Candle M.D.   On: 09/20/2020 17:20       LOS: 0 days   [facility] Pager on [email or web address]  09/21/2020, 10:00 AM

## 2020-09-21 NOTE — Evaluation (Signed)
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 CT Abd showing new hypodense liver lesions.    Clinical Impression   Patient lives alone in [place] and typically mod I with cane "because of my vertigo." Patient reports decline "for a long time" and increased difficulty with IADLs such as meal prep and cleaning. When asked who is getting her groceries patient states "Nobody, I haven't been eating much." Currently patient limited by decreased activity tolerance with overall supervision level for BADLs. Initiated education regarding energy conservation and UE strengthening in order to maximize safety and independence with daily routine. Acute OT services will continue in order to facilitate to D/C venue listed below.    Follow Up Recommendations  No OT follow up    Equipment Recommendations  Tub/shower seat       Precautions / Restrictions Precautions Precautions: Fall Restrictions Weight Bearing Restrictions: No      Mobility Bed Mobility Overal bed mobility: Modified Independent                  Transfers Overall transfer level: Needs assistance Equipment used:  (IV pole) Transfers: Sit to/from Stand Sit to Stand: Supervision         General transfer comment: S for safety    Balance Overall balance assessment: Mild deficits observed, not formally tested                                         ADL either performed or assessed with clinical judgement   ADL Overall ADL's : Needs assistance/impaired     Grooming: Oral care;Wash/dry face;Supervision/safety;Standing Grooming Details (indicate  cue type and reason): ambulate to sink for g/h, supervision due to decreased activity tolerance, report of near passing out at home Upper Body Bathing: Set up;Sitting   Lower Body Bathing: Supervison/ safety;Sit to/from stand   Upper Body Dressing : Set up;Sitting   Lower Body Dressing: Supervision/safety;Sitting/lateral leans;Sit to/from stand;Set up Lower Body Dressing Details (indicate cue type and reason): to don socks seated EOB Toilet Transfer: Supervision/safety;Ambulation;Regular Toilet (IV pole) Toilet Transfer Details (indicate cue type and reason): supervision for safety due to decreased activity tolerance, patient reports low energy/tired  Toileting- Clothing Manipulation and Hygiene: Independent       Functional mobility during ADLs: Supervision/safety General ADL Comments: patient with decreased activity tolerance impacting safety and independence with self care. Initiate education re: energy conservation strategies and UB strengthening in order to build endurance for daily routine.                  Pertinent Vitals/Pain Pain Assessment: 0-10 Pain Score: 3  Pain Location: upper abdomen Pain Descriptors / Indicators: Sore Pain Intervention(s): Monitored during session     Hand Dominance Right   Extremity/Trunk Assessment Upper Extremity Assessment Upper Extremity Assessment: Overall WFL for tasks assessed   Lower Extremity Assessment Lower Extremity Assessment: Defer to PT evaluation       Communication Communication Communication: No difficulties   Cognition Arousal/Alertness: Awake/alert Behavior During Therapy: WFL for tasks assessed/performed Overall Cognitive Status: Within Functional  Limits for tasks assessed                                                Home Living Family/patient expects to be discharged to:: Private residence Living Arrangements: Alone Available Help at Discharge: Family;Available PRN/intermittently Type  of Home: Apartment (condo) Home Access: Level entry     Home Layout: One level     Bathroom Shower/Tub: Teacher, early years/pre: Standard     Home Equipment: Cane - single point;Grab bars - tub/shower          Prior Functioning/Environment Level of Independence: Independent with assistive device(s)        Comments: uses cane for vertigo per patient. reports increased difficulty with IADLs such as meal prep, cleaning        OT Problem List: Decreased activity tolerance;Pain;Decreased knowledge of use of DME or AE      OT Treatment/Interventions: Self-care/ADL training;Therapeutic exercise;Energy conservation;DME and/or AE instruction;Therapeutic activities;Patient/family education;Balance training    OT Goals(Current goals can be found in the care plan section) Acute Rehab OT Goals Patient Stated Goal: have more energy OT Goal Formulation: With patient Time For Goal Achievement: 10/05/20 Potential to Achieve Goals: Good  OT Frequency: Min 2X/week    AM-PAC OT "6 Clicks" Daily Activity     Outcome Measure Help from another person eating meals?: None Help from another person taking care of personal grooming?: A Little Help from another person toileting, which includes using toliet, bedpan, or urinal?: A Little Help from another person bathing (including washing, rinsing, drying)?: A Little Help from another person to put on and taking off regular upper body clothing?: A Little Help from another person to put on and taking off regular lower body clothing?: A Little 6 Click Score: 19   End of Session Nurse Communication: Mobility status  Activity Tolerance: Patient tolerated treatment well Patient left: in bed;with call bell/phone within reach  OT Visit Diagnosis: Other abnormalities of gait and mobility (R26.89);Pain Pain - part of body:  (upper abdomen)                Time: 4081-4481 OT Time Calculation (min): 25 min Charges:  OT General Charges $OT  Visit: 1 Visit OT Evaluation $OT Eval Low Complexity: 1 Low OT Treatments $Self Care/Home Management : 8-22 mins  Delbert Phenix OT OT pager: [place] 09/21/2020, 12:46 PM

## 2020-09-21 NOTE — Progress Notes (Signed)
HEMATOLOGY-ONCOLOGY PROGRESS NOTE  SUBJECTIVE: The patient presented to the hospital secondary to nausea, dizziness, fatigue.  She was found to have dehydration.  Receiving IV fluids.  CT chest/abdomen/pelvis with contrast was performed which showed innumerable new hypodense liver lesions consistent with new hepatic metastatic disease, previously noted enlarged prevascular lymph nodes resolved reflecting response to radiation therapy, redemonstrated postop findings of wedge resection of the lingula and left lower lobe.  Still has dizziness this morning.  Also reports some abdominal discomfort and nausea.  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[place])   11/03/2019 -  Chemotherapy   The patient had palonosetron (ALOXI) injection 0.25 mg, 0.25 mg, Intravenous,  Once, 4 of 4 cycles Administration: 0.25 mg (11/03/2019), 0.25 mg (11/24/2019), 0.25 mg (12/15/2019), 0.25 mg (01/05/2020) pegfilgrastim-jmdb (FULPHILA) injection 6 mg, 6 mg, Subcutaneous,  Once, 4 of 4 cycles Administration: 6 mg (11/08/2019), 6 mg (11/28/2019), 6 mg (12/19/2019), 6 mg (01/09/2020) CISplatin (PLATINOL) 150 mg in sodium chloride 0.9 % 500 mL chemo infusion, 76.5 mg/m2 = 157 mg, Intravenous,  Once, 4 of 4 cycles Administration: 150 mg (11/03/2019), 150 mg (11/24/2019), 150 mg (12/15/2019), 150 mg (01/05/2020) etoposide (VEPESID) 200 mg in sodium chloride 0.9 % 500 mL chemo infusion, 100 mg/m2 = 200 mg, Intravenous,  Once, 4 of 4 cycles Administration: 200 mg (11/03/2019), 200 mg (11/04/2019), 200 mg (11/05/2019), 200 mg (11/24/2019), 200 mg (11/25/2019), 200 mg (11/26/2019), 200 mg (12/15/2019), 200 mg (12/16/2019), 200 mg (12/17/2019), 200 mg (01/05/2020), 200 mg (01/06/2020), 200 mg (01/07/2020) fosaprepitant (EMEND) 150 mg in sodium chloride 0.9 % 145 mL IVPB, 150 mg, Intravenous,  Once, 4 of 4 cycles Administration: 150 mg (11/03/2019), 150 mg (11/24/2019), 150 mg  (12/15/2019), 150 mg (01/05/2020)  for chemotherapy treatment.       REVIEW OF SYSTEMS:   Constitutional: Denies fevers, chills Eyes: Denies blurriness of vision Ears, nose, mouth, throat, and face: Denies mucositis or sore throat Respiratory: Denies cough, dyspnea or wheezes Cardiovascular: Denies palpitation, chest discomfort Gastrointestinal: Reports nausea, no vomiting Skin: Denies abnormal skin rashes Lymphatics: Denies new lymphadenopathy or easy bruising Neurological: Reports dizziness Behavioral/Psych: Mood is stable, no new changes  Extremities: No lower extremity edema All other systems were reviewed with the patient and are negative.  I have reviewed the past medical history, past surgical history, social history and family history with the patient and they are unchanged from previous note.   PHYSICAL EXAMINATION: ECOG PERFORMANCE STATUS: 2 - Symptomatic, <50% confined to bed  Vitals:   09/21/20 0455 09/21/20 0819  BP: 132/68 109/63  Pulse: 86 81  Resp: 14 16  Temp: 99.1 F (37.3 C) 98.4 F (36.9 C)  SpO2: 98% 97%   Filed Weights   09/20/20 2213  Weight: 72.5 kg    Intake/Output from previous day: 11/15 0701 - 11/16 0700 In: 920 [P.O.:120; I.V.:800] Out: 600 [Urine:600]  GENERAL:alert, no distress and comfortable ABDOMEN: Positive bowel sounds, soft, mild tenderness in the right upper quadrant NEURO: alert & oriented x 3 with fluent speech, no focal motor/sensory deficits  LABORATORY DATA:  I have reviewed the data as listed CMP Latest Ref Rng & Units 09/21/2020 09/20/2020 05/11/2020  Glucose 70 - 99 mg/dL 117(H) 104(H) 99  BUN 8 - 23 mg/dL 11 15 21   Creatinine 0.44 - 1.00 mg/dL 0.84 0.92 1.22(H)  Sodium 135 - 145 mmol/L 137 139 143  Potassium 3.5 - 5.1 mmol/L 3.4(L) 4.0  4.2  Chloride 98 - 111 mmol/L 106 105 108  CO2 22 - 32 mmol/L 21(L) 22 23  Calcium 8.9 - 10.3 mg/dL 9.0 10.0 9.8  Total Protein 6.5 - 8.1 g/dL 6.6 7.1 7.2  Total Bilirubin 0.3 - 1.2  mg/dL 0.6 0.8 0.4  Alkaline Phos 38 - 126 U/L 204(H) 236(H) 154(H)  AST 15 - 41 U/L 95(H) 98(H) 14(L)  ALT 0 - 44 U/L 65(H) 69(H) 15    Lab Results  Component Value Date   WBC 7.4 09/21/2020   HGB 10.7 (L) 09/21/2020   HCT 32.7 (L) 09/21/2020   MCV 103.8 (H) 09/21/2020   PLT 210 09/21/2020   NEUTROABS 3.0 05/11/2020    CT Chest W Contrast  Result Date: 09/20/2020 CLINICAL DATA:  Metastatic left lung cancer, assess treatment response, abdominal pain, weakness, nausea, vomiting, diarrhea EXAM: CT CHEST, ABDOMEN, AND PELVIS WITH CONTRAST TECHNIQUE: Multidetector CT imaging of the chest, abdomen and pelvis was performed following the standard protocol during bolus administration of intravenous contrast. CONTRAST:  11mL OMNIPAQUE IOHEXOL 300 MG/ML SOLN, additional oral enteric contrast COMPARISON:  PET-CT, 05/23/2020 FINDINGS: CT CHEST FINDINGS Cardiovascular: Aortic atherosclerosis. Normal heart size. Three-vessel coronary artery calcifications. No pericardial effusion. Mediastinum/Nodes: No enlarged mediastinal, hilar, or axillary lymph nodes. Small hiatal hernia. Previously noted enlarged prevascular lymph nodes are resolved, measuring no greater than 4-5 mm (series 2, image 23). Thyroid gland, trachea, and esophagus demonstrate no significant findings. Lungs/Pleura: Mild, predominantly paraseptal emphysema. Redemonstrated postoperative findings of wedge resection of the lingula (series 4, image 74) and left lower lobe (series 4, image 100). No pleural effusion or pneumothorax. Musculoskeletal: No chest wall mass or suspicious bone lesions identified. CT ABDOMEN PELVIS FINDINGS Hepatobiliary: There are innumerable new, hypodense liver lesions of varying sizes, largest, index lesion of the peripheral right lobe of the liver measuring 4.2 x 3.3 cm (series 2, image 58). No gallstones, gallbladder wall thickening, or biliary dilatation. Pancreas: Unremarkable. No pancreatic ductal dilatation or  surrounding inflammatory changes. Spleen: Normal in size without significant abnormality. Adrenals/Urinary Tract: Adrenal glands are unremarkable. Kidneys are normal, without renal calculi, solid lesion, or hydronephrosis. Thickening of the urinary bladder. Stomach/Bowel: Stomach is within normal limits. Appendix appears normal. No evidence of bowel wall thickening, distention, or inflammatory changes. Descending and sigmoid diverticulosis. Vascular/Lymphatic: Aortic atherosclerosis. No enlarged abdominal or pelvic lymph nodes. Reproductive: No mass or other abnormality. Other: No abdominal wall hernia or abnormality. No abdominopelvic ascites. Musculoskeletal: No acute or significant osseous findings. IMPRESSION: 1. There are innumerable new, hypodense liver lesions of varying sizes, consistent with new hepatic metastatic disease. 2. Previously noted enlarged prevascular lymph nodes are resolved, measuring no greater than 4-5 mm, presumably reflecting response of metastatic lymph nodes to radiation therapy. 3. Redemonstrated postoperative findings of wedge resection of the lingula and left lower lobe. 4. Thickening of the urinary bladder, nonspecific but suggestive of infectious or inflammatory cystitis. Correlate with urinalysis. Aortic Atherosclerosis (ICD10-I70.0) and Emphysema (ICD10-J43.9). Electronically Signed   By: Eddie Candle M.D.   On: 09/20/2020 17:20   CT Abdomen Pelvis W Contrast  Result Date: 09/20/2020 CLINICAL DATA:  Metastatic left lung cancer, assess treatment response, abdominal pain, weakness, nausea, vomiting, diarrhea EXAM: CT CHEST, ABDOMEN, AND PELVIS WITH CONTRAST TECHNIQUE: Multidetector CT imaging of the chest, abdomen and pelvis was performed following the standard protocol during bolus administration of intravenous contrast. CONTRAST:  11mL OMNIPAQUE IOHEXOL 300 MG/ML SOLN, additional oral enteric contrast COMPARISON:  PET-CT, 05/23/2020 FINDINGS: CT CHEST FINDINGS  Cardiovascular:  Aortic atherosclerosis. Normal heart size. Three-vessel coronary artery calcifications. No pericardial effusion. Mediastinum/Nodes: No enlarged mediastinal, hilar, or axillary lymph nodes. Small hiatal hernia. Previously noted enlarged prevascular lymph nodes are resolved, measuring no greater than 4-5 mm (series 2, image 23). Thyroid gland, trachea, and esophagus demonstrate no significant findings. Lungs/Pleura: Mild, predominantly paraseptal emphysema. Redemonstrated postoperative findings of wedge resection of the lingula (series 4, image 74) and left lower lobe (series 4, image 100). No pleural effusion or pneumothorax. Musculoskeletal: No chest wall mass or suspicious bone lesions identified. CT ABDOMEN PELVIS FINDINGS Hepatobiliary: There are innumerable new, hypodense liver lesions of varying sizes, largest, index lesion of the peripheral right lobe of the liver measuring 4.2 x 3.3 cm (series 2, image 58). No gallstones, gallbladder wall thickening, or biliary dilatation. Pancreas: Unremarkable. No pancreatic ductal dilatation or surrounding inflammatory changes. Spleen: Normal in size without significant abnormality. Adrenals/Urinary Tract: Adrenal glands are unremarkable. Kidneys are normal, without renal calculi, solid lesion, or hydronephrosis. Thickening of the urinary bladder. Stomach/Bowel: Stomach is within normal limits. Appendix appears normal. No evidence of bowel wall thickening, distention, or inflammatory changes. Descending and sigmoid diverticulosis. Vascular/Lymphatic: Aortic atherosclerosis. No enlarged abdominal or pelvic lymph nodes. Reproductive: No mass or other abnormality. Other: No abdominal wall hernia or abnormality. No abdominopelvic ascites. Musculoskeletal: No acute or significant osseous findings. IMPRESSION: 1. There are innumerable new, hypodense liver lesions of varying sizes, consistent with new hepatic metastatic disease. 2. Previously noted enlarged  prevascular lymph nodes are resolved, measuring no greater than 4-5 mm, presumably reflecting response of metastatic lymph nodes to radiation therapy. 3. Redemonstrated postoperative findings of wedge resection of the lingula and left lower lobe. 4. Thickening of the urinary bladder, nonspecific but suggestive of infectious or inflammatory cystitis. Correlate with urinalysis. Aortic Atherosclerosis (ICD10-I70.0) and Emphysema (ICD10-J43.9). Electronically Signed   By: Eddie Candle M.D.   On: 09/20/2020 17:20    ASSESSMENT AND PLAN: This is a 64 year old white female recently diagnosed with a stage Ia non-small cell lung cancer, adenocarcinoma as well as a stage Ia small cell lung cancer status post surgical resection in October 2020 under the care of Dr. Roxan Hockey. The patient completed adjuvant systemic chemotherapy for the small cell lung cancer with cisplatin 80 mg/M2 on day 1 and etoposide 100 mg/M2 on days 1, 2 and 3 with Neulasta support every 3 weeks status post 4 cycles. The patient was followed by observation but recent CT scan of the chest followed by a PET scan showed left prevascular lymph node that is hypermetabolic compatible with malignancy.  There is no other evidence of distant metastatic disease.. 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  She had no other evidence of metastatic disease outside the chest. She was seen by Dr. Roxan Hockey for consideration of biopsy but because of the need for redo left VATS, she decided against the procedure and she would like to be treated empirically.  She was seen by radiation oncology and completed SBRT to the left mediastinal lymph nodes as well as PCI to the brain.  The patient was due for a restaging CT scan and follow-up with Dr. [name] this month.  She is now admitted with abdominal pain and CT scan concerning for liver metastasis.  Discussed CT scan findings with the patient.  We discussed with  Dr. [name] is aware of her admission and plans to see her in the office to discuss these findings in detail.  He will  discuss further treatment which would likely consist of systemic chemotherapy.  Further recommendations for treatment will be made when she is seen in our office.  She is currently scheduled for follow-up with Dr. [name] on 11/22.  I will keep this appointment as scheduled.  If she remains in the hospital, we will change his appointment.   LOS: 0 days   Mikey Bussing, DNP, AGPCNP-BC, AOCNP 09/21/20

## 2020-09-21 NOTE — Evaluation (Signed)
Physical Therapy Evaluation Patient Details Name: Andrea Kline MRN: [id number] DOB: [birth year] Today's Date: 09/21/2020   History of Present Illness  Patient is a 64 year old female past medical history of adenocarcinoma lung as well as small cell cancer of the lung s/p chemo April radiation in August, COPD, anxiety and depression who comes in with 2 to 3-week history of weakness lightheadedness nausea. CT Abd showing new hypodense liver lesions.   Clinical Impression  Pt admitted with above diagnosis.  Pt reports feeling much better overall today. Independent amb at her baseline in her apt, uses cane when going out. Pt sister reports pt has not been accepting of help.  Pt prefers to be independent. Does not like anyone at her apartment At this time PT f/u not indicated, will follow in acute setting.  It does appear per conversation with pt's sister that she does need incr support/assistance for some of her ADLs, house cleaning, etc.  Pt currently with functional limitations due to the deficits listed below (see PT Problem List). Pt will benefit from skilled PT to increase their independence and safety with mobility to allow discharge to the venue listed below.        Follow Up Recommendations No PT follow up    Equipment Recommendations  3in1 (PT)    Recommendations for Other Services       Precautions / Restrictions Precautions Precautions: Fall Restrictions Weight Bearing Restrictions: No      Mobility  Bed Mobility Overal bed mobility: Modified Independent                  Transfers Overall transfer level: Needs assistance Equipment used: None Transfers: Sit to/from Stand Sit to Stand: Supervision         General transfer comment: for safety  Ambulation/Gait Ambulation/Gait assistance: Supervision;Min guard Gait Distance (Feet): 160 Feet Assistive device: IV Pole Gait Pattern/deviations: Step-through pattern;Trunk flexed     General Gait Details: gait  stability improved with distance. pt pushing IV pole, negotiates turns and obstacles in hallway without LOB  Stairs            Wheelchair Mobility    Modified Rankin (Stroke Patients Only)       Balance Overall balance assessment: Mild deficits observed, not formally tested   Sitting balance-Leahy Scale: Good       Standing balance-Leahy Scale: Fair Standing balance comment: reliant on uilateer UE support for dynamic tasks                             Pertinent Vitals/Pain Pain Assessment: Faces Pain Score: 3  Faces Pain Scale: Hurts a little bit Pain Location: upper abdomen Pain Descriptors / Indicators: Sore Pain Intervention(s): Monitored during session    Home Living Family/patient expects to be discharged to:: Private residence Living Arrangements: Alone Available Help at Discharge: Family;Available PRN/intermittently Type of Home: Apartment Home Access: Level entry     Home Layout: One level Home Equipment: Cane - single point;Grab bars - tub/shower Additional Comments: amb with cane when amb longer distances, no cane in apt    Prior Function Level of Independence: Independent with assistive device(s)         Comments: reports increased difficulty with IADLs such as meal prep, cleaning     Hand Dominance   Dominant Hand: Right    Extremity/Trunk Assessment   Upper Extremity Assessment Upper Extremity Assessment: Overall WFL for tasks assessed  Lower Extremity Assessment Lower Extremity Assessment: Overall WFL for tasks assessed       Communication   Communication: No difficulties  Cognition Arousal/Alertness: Awake/alert Behavior During Therapy: WFL for tasks assessed/performed;Flat affect Overall Cognitive Status: Within Functional Limits for tasks assessed                                        General Comments      Exercises     Assessment/Plan    PT Assessment Patient needs continued PT  services  PT Problem List Decreased strength;Decreased mobility;Decreased balance;Decreased knowledge of use of DME;Pain;Decreased activity tolerance       PT Treatment Interventions Therapeutic activities;Gait training;Functional mobility training;Therapeutic exercise;Patient/family education    PT Goals (Current goals can be found in the Care Plan section)  Acute Rehab PT Goals Patient Stated Goal: have more energy PT Goal Formulation: With patient Time For Goal Achievement: 10/05/20 Potential to Achieve Goals: Good    Frequency Min 3X/week   Barriers to discharge        Co-evaluation               AM-PAC PT "6 Clicks" Mobility  Outcome Measure Help needed turning from your back to your side while in a flat bed without using bedrails?: None Help needed moving from lying on your back to sitting on the side of a flat bed without using bedrails?: None Help needed moving to and from a bed to a chair (including a wheelchair)?: A Little Help needed standing up from a chair using your arms (e.g., wheelchair or bedside chair)?: A Little Help needed to walk in hospital room?: A Little Help needed climbing 3-5 steps with a railing? : A Little 6 Click Score: 20    End of Session   Activity Tolerance: Patient tolerated treatment well Patient left: in bed;with call bell/phone within reach;with family/visitor present;with bed alarm set   PT Visit Diagnosis: Other abnormalities of gait and mobility (R26.89)    Time: 2023-3435 PT Time Calculation (min) (ACUTE ONLY): 19 min   Charges:   PT Evaluation $PT Eval Low Complexity: [place], PT  Acute Rehab Dept ([place]) [phone] Pager [phone]  09/21/2020   [facility] 09/21/2020, 2:47 PM

## 2020-09-21 NOTE — Progress Notes (Signed)
Pt states she should only be taking the Prolensa until November 25th, one drop in rt at bedtime. Spoke with pharmacy about it.

## 2020-09-21 NOTE — TOC Initial Note (Signed)
Transition of Care [facility]) - Initial/Assessment Note    Patient Details  Name: Andrea Kline MRN: [id number] Date of Birth: [birth year]  Transition of Care [facility]) CM/SW Contact:    Andrea Pall, LCSW Phone Number: 09/21/2020, 2:43 PM  Clinical Narrative:                 Met with pt and her sister this afternoon to begin reviewing for potential dc needs.  Both very pleasant and are aware of the new findings of liver mets.  Pt engaged in discussion, however, voice is soft and she appears fatigued.  Pt confirms that she does live alone with her two cats and has been independent at home and still doing some driving.  Sister lives just a few minutes from pt and is willing to assist and does when pt agrees.  Sister reports pt is very private and is concerned that she has been slowly declining over the past several months.  Notes pt's cognition is "not as good" since receiving prophylactic brain radiation.  Sister also notes that pt has not begun "preparing" for what may lie ahead of her in terms of care needs and they have been talking about need for advanced directives. (have asked RN to place order for Chaplain to assist with these documents per pt/sister agreement). Pt and sister are eager to speak with MD about current findings and anticipated plans moving forward and would like to be able to ask questions of MD prior to hospital discharge.   I did discuss possible referral to Palliative Care team and both are very receptive. Have alerted Dr. Inda Kline to this if he is agreement to making a referral to PMT.  Feel that Palliative would be beneficial in guiding difficult discussions that sister feels need to happen. TOC will continue to follow for dc needs.  Pt doing well with PT this afternoon.  Expected Discharge Plan: [place] (vs home/ self care) Barriers to Discharge: Continued Medical Work up   Patient Goals and CMS Choice Patient states their goals for this hospitalization and  ongoing recovery are:: to return to her own home      Expected Discharge Plan and Services Expected Discharge Plan: [place] (vs home/ self care) In-house Referral: Clinical Social Work     Living arrangements for the past 2 months: Apartment                                      Prior Living Arrangements/Services Living arrangements for the past 2 months: Apartment Lives with:: Self Patient language and need for interpreter reviewed:: Yes Do you feel safe going back to the place where you live?: Yes      Need for Family Participation in Patient Care: Yes (Comment) Care giver support system in place?: No (comment)   Criminal Activity/Legal Involvement Pertinent to Current Situation/Hospitalization: No - Comment as needed  Activities of Daily Living Home Assistive Devices/Equipment: Eyeglasses, Cane (specify quad or straight) (reading glasses) ADL Screening (condition at time of admission) Patient's cognitive ability adequate to safely complete daily activities?: Yes Is the patient deaf or have difficulty hearing?: No Does the patient have difficulty seeing, even when wearing glasses/contacts?: No Does the patient have difficulty concentrating, remembering, or making decisions?: No Patient able to express need for assistance with ADLs?: Yes Does the patient have difficulty dressing or bathing?: Yes Independently performs  ADLs?: No Communication: Independent Dressing (OT): Needs assistance Is this a change from baseline?: Change from baseline, expected to last >3 days Grooming: Independent Feeding: Independent Bathing: Needs assistance Is this a change from baseline?: Change from baseline, expected to last >3 days Toileting: Needs assistance Is this a change from baseline?: Pre-admission baseline In/Out Bed: Needs assistance Is this a change from baseline?: Pre-admission baseline Walks in Home: Needs assistance Is this a change from baseline?:  Pre-admission baseline Does the patient have difficulty walking or climbing stairs?: Yes Weakness of Legs: Both Weakness of Arms/Hands: Both  Permission Sought/Granted Permission sought to share information with : Family Supports    Share Information with NAME: Andrea Kline     Permission granted to share info w Relationship: sister  Permission granted to share info w Contact Information: [phone]  Emotional Assessment Appearance:: Appears stated age Attitude/Demeanor/Rapport: Gracious, Engaged Affect (typically observed): Accepting, Pleasant, Quiet Orientation: : Oriented to Self, Oriented to Place, Oriented to  Time, Oriented to Situation Alcohol / Substance Use: Not Applicable Psych Involvement: No (comment)  Admission diagnosis:  Liver metastases ([place]) [C78.7] Metastatic cancer to liver [facility]) [C78.7] Patient Active Problem List   Diagnosis Date Noted  . FTT (failure to thrive) in adult 09/20/2020  . Nausea 09/20/2020  . Liver metastases ([place]) 09/20/2020  . Dehydration 09/20/2020  . Transaminitis 09/20/2020  . Macrocytic anemia 09/20/2020  . 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 Hypertension 11/10/2019  . Goals of care, counseling/discussion 09/29/2019  . Adenocarcinoma of left lung, stage 1 ([place]) 09/12/2019  . 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 Encounter for antineoplastic chemotherapy 09/12/2019  . S/P thoracotomy 08/21/2019  . Tobacco abuse 10/22/2018  . Diverticulosis 10/22/2018  . Pulmonary nodule 04/10/2018  . Centrilobular emphysema ([place]) 04/10/2018  . Coronary artery calcification seen on CAT scan 04/10/2018  . Acute GI bleeding 02/28/2018  . Tear of left lunotriquetral ligament 10/28/2015  . Complex tear of triangular fibrocartilage of right wrist 10/28/2015  . Trigger thumb of right hand 10/21/2015  . Labyrinthitis of both ears 04/06/2014  . Migraine, unspecified, without mention of intractable migraine  without mention of status migrainosus 04/06/2014  . Vertigo 04/05/2014  . Anxiety 04/05/2014  . Depression 04/05/2014  . HLD (hyperlipidemia) 04/05/2014  . Hematuria 04/05/2014  . History of nephrolithiasis 04/05/2014  . History of colon polyps 04/05/2014   PCP:  [name], MD Pharmacy:   [facility] - [place], [place] - [address] [place] [address] Phone: [phone] Fax: [phone]     Social Determinants of Health (SDOH) Interventions    Readmission Risk Interventions Readmission Risk Prevention Plan 09/21/2020  Post Dischage Appt Complete  Medication Screening Complete  Transportation Screening Complete  Some recent data might be hidden

## 2020-09-22 DIAGNOSIS — E440 Moderate protein-calorie malnutrition: Secondary | ICD-10-CM

## 2020-09-22 DIAGNOSIS — R110 Nausea: Secondary | ICD-10-CM

## 2020-09-22 DIAGNOSIS — Z515 Encounter for palliative care: Secondary | ICD-10-CM

## 2020-09-22 DIAGNOSIS — C787 Secondary malignant neoplasm of liver and intrahepatic bile duct: Secondary | ICD-10-CM

## 2020-09-22 DIAGNOSIS — Z7189 Other specified counseling: Secondary | ICD-10-CM

## 2020-09-22 DIAGNOSIS — E876 Hypokalemia: Secondary | ICD-10-CM

## 2020-09-22 DIAGNOSIS — R7401 Elevation of levels of liver transaminase levels: Secondary | ICD-10-CM

## 2020-09-22 DIAGNOSIS — R627 Adult failure to thrive: Secondary | ICD-10-CM

## 2020-09-22 DIAGNOSIS — D539 Nutritional anemia, unspecified: Secondary | ICD-10-CM

## 2020-09-22 DIAGNOSIS — E860 Dehydration: Principal | ICD-10-CM

## 2020-09-22 LAB — COMPREHENSIVE METABOLIC PANEL WITH GFR
ALT: 62 U/L — ABNORMAL HIGH (ref 0–44)
AST: 75 U/L — ABNORMAL HIGH (ref 15–41)
Albumin: 3.5 g/dL (ref 3.5–5.0)
Alkaline Phosphatase: 217 U/L — ABNORMAL HIGH (ref 38–126)
Anion gap: 8 (ref 5–15)
BUN: <5 mg/dL — ABNORMAL LOW (ref 8–23)
CO2: 23 mmol/L (ref 22–32)
Calcium: 9.4 mg/dL (ref 8.9–10.3)
Chloride: 105 mmol/L (ref 98–111)
Creatinine, Ser: 0.89 mg/dL (ref 0.44–1.00)
GFR, Estimated: >60 mL/min
Glucose, Bld: 106 mg/dL — ABNORMAL HIGH (ref 70–99)
Potassium: 4 mmol/L (ref 3.5–5.1)
Sodium: 136 mmol/L (ref 135–145)
Total Bilirubin: 0.6 mg/dL (ref 0.3–1.2)
Total Protein: 6.3 g/dL — ABNORMAL LOW (ref 6.5–8.1)

## 2020-09-22 LAB — CBC
HCT: 33.1 % — ABNORMAL LOW (ref 36.0–46.0)
Hemoglobin: 10.8 g/dL — ABNORMAL LOW (ref 12.0–15.0)
MCH: 34.3 pg — ABNORMAL HIGH (ref 26.0–34.0)
MCHC: 32.6 g/dL (ref 30.0–36.0)
MCV: 105.1 fL — ABNORMAL HIGH (ref 80.0–100.0)
Platelets: 205 10*3/uL (ref 150–400)
RBC: 3.15 MIL/uL — ABNORMAL LOW (ref 3.87–5.11)
RDW: 12.6 % (ref 11.5–15.5)
WBC: 6.8 10*3/uL (ref 4.0–10.5)
nRBC: 0 % (ref 0.0–0.2)

## 2020-09-22 LAB — MAGNESIUM: Magnesium: 1.8 mg/dL (ref 1.7–2.4)

## 2020-09-22 MED ORDER — BOOST / RESOURCE BREEZE PO LIQD CUSTOM
1.0000 | Freq: Three times a day (TID) | ORAL | Status: DC
  Administered 2020-09-22: 14:00:00 1 via ORAL

## 2020-09-22 MED ORDER — MAGNESIUM SULFATE 2 GM/50ML IV SOLN
2.0000 g | Freq: Once | INTRAVENOUS | Status: AC
  Administered 2020-09-22: 09:00:00 2 g via INTRAVENOUS
  Filled 2020-09-22: qty 50

## 2020-09-22 MED ORDER — PROCHLORPERAZINE EDISYLATE 10 MG/2ML IJ SOLN
10.0000 mg | Freq: Four times a day (QID) | INTRAMUSCULAR | Status: DC | PRN
  Administered 2020-09-22: 23:00:00 10 mg via INTRAVENOUS
  Filled 2020-09-22: qty 2

## 2020-09-22 NOTE — Progress Notes (Signed)
Chaplain engaged in initial visit with Andrea Kline and her sister Andrea Kline.  Chaplain provided education around Advanced Directive and the process.  Andrea Kline and Andrea Kline wanted to take time to complete the paperwork and discuss Andrea Kline's needs.  Chaplain Andrea Kline has referred completion of Advanced Directive to [employer].  Chaplain will follow-up.    09/22/20 1200  Clinical Encounter Type  Visited With Patient and family together  Visit Type Initial  Referral From Family;Patient  Consult/Referral To Chaplain

## 2020-09-22 NOTE — Progress Notes (Signed)
PT Cancellation Note  Patient Details Name: Andrea Kline MRN: [id number] DOB: [birth year]   Cancelled Treatment:    Reason Eval/Treat Not Completed: Other (comment)patient reports that she ambukated today. Provided her with orange bands and reviewed U/LE exercises. Continue to  Follow for mobility.    Kamron Portee [place] Pager [phone] Office [phone]  09/22/2020, 2:46 PM

## 2020-09-22 NOTE — Progress Notes (Signed)
Initial Nutrition Assessment  DOCUMENTATION CODES:   Non-severe (moderate) malnutrition in context of chronic illness  INTERVENTION:   -Boost Breeze po TID, each supplement provides 250 kcal and 9 grams of protein while on clears  -Ensure Enlive po BID, each supplement provides 350 kcal and 20 grams of protein Vanilla flavor  NUTRITION DIAGNOSIS:   Moderate Malnutrition related to chronic illness, cancer and cancer related treatments as evidenced by energy intake < or equal to 75% for > or equal to 1 month, percent weight loss.  GOAL:   Patient will meet greater than or equal to 90% of their needs  MONITOR:   PO intake, Supplement acceptance, Labs, Weight trends, I & O's, GOC  REASON FOR ASSESSMENT:   Malnutrition Screening Tool, Consult Assessment of nutrition requirement/status  ASSESSMENT:   64 y.o. 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  Patient in room with sister at bedside. Pt reports her appetite has been poor for 3 months since completing radiation. Initially pt did have some difficulty swallowing but currently denies this now. Pt has had episodes of N/V and diarrhea intermittently at home. Pt may have a good day of eating every other day and she reports a good day of eating is a toasted english muffin and some cookies later that day. Pt also drinks Boost supplements at home.   Pt currently on clear liquids, will order Boost Breeze until vanilla Ensure can be ordered once diet is advanced.  UBW is 180-185 lbs. Per weight records, pt has lost 19 lbs since 7/20 (10% wt loss x 4 months, significant for time frame).  Labs reviewed. Medications: IV Mg sulfate, IV Zofran   NUTRITION - FOCUSED PHYSICAL  EXAM:    Most Recent Value  Orbital Region No depletion  Upper Arm Region No depletion  Thoracic and Lumbar Region Unable to assess  Buccal Region No depletion  Temple Region Mild depletion  Clavicle Bone Region No depletion  Clavicle and Acromion Bone Region No depletion  Scapular Bone Region No depletion  Dorsal Hand No depletion  Patellar Region Unable to assess  Anterior Thigh Region Unable to assess  Posterior Calf Region Unable to assess  Edema (RD Assessment) None  Hair --  [No hair]  Eyes Reviewed  Mouth Reviewed  Skin Reviewed       Diet Order:   Diet Order            Diet clear liquid Room service appropriate? Yes; Fluid consistency: Thin  Diet effective now                 EDUCATION NEEDS:   No education needs have been identified at this time  Skin:  Skin Assessment: Reviewed RN Assessment  Last BM:  11/17  Height:   Ht Readings from Last 1 Encounters:  09/20/20 5\' 5"  (1.651 m)    Weight:   Wt Readings from Last 1 Encounters:  09/20/20 72.5 kg    BMI:  Body mass index is 26.6 kg/m.  Estimated Nutritional Needs:   Kcal:  2100-2300  Protein:  105-115g  Fluid:  2L/day   Clayton Bibles, MS, RD, LDN Inpatient Clinical Dietitian Contact information available via Amion

## 2020-09-22 NOTE — Progress Notes (Signed)
PROGRESS NOTE    Andrea Kline  UVO:[id number] DOB: 04-22-1956 DOA: 09/20/2020 PCP: [name], MD   Brief Narrative:  HPI per Dr. 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 She has had few episodes of vomiting as well. Occasional diarrhea. Complains of upper abdominal pain which is significant only when somebody is pushing on it. Otherwise is about 3 out of 10 in intensity. Denies any radiation of the pain. Denies any blood in her emesis. Denies any fever or chills. Denies any passing out episodes although she hascome close to it. Has felt unsteady on her feet the last several days and has almost fallen down. Denies any injuries. Patient lives with herself. She saw her primary care physician recently who stopped her Celebrex and statin medication.   In the emergency department patient was noted to be fatigued, dehydrated. Noted to have transaminitis. She underwent CT scan of chest abdomen pelvis which raises concern for new metastatic lesions in her liver. Patient lives by herself. Unable to ambulate. Will be brought in for further management.  **Interim History  PT OT recommending no follow-up and diet has been advanced.  Palliative care was consulted for goals of care discussion.  Medical oncology recommending outpatient follow-up for further options and discussion.  Anticipating discharge in next 24 to 48 hours  Assessment & Plan:   Active Problems:   FTT (failure to thrive) in adult   Nausea   Liver metastases (H[facility])   Dehydration   Transaminitis   Macrocytic anemia   Malnutrition of moderate degree  Failure to thrive with dehydration and nausea and  vomiting: -Symptoms likely due to metastatic process noted in her liver.  -Patient continues to have pain in the right upper quadrant.   -PT and OT evaluation recommending no follow up.   -Continue with IV fluids and advanced diet.  She has had very poor oral intake due to her symptoms but will go to Soft Diet.  Control her nausea.   -Patient lives by herself and is not considered safe to go back to being by herself at this time.  She has had near falls but sister able to look after her -MOST form done as Palliative consulted for [place] discussion -LFTs are improving some -Nausea and Vomiting improved -Dehydration is improving and Labs are normalizing  -If able to tolerate a Soft Diet likely can be D/C'd in the AM   History of lung cancer, both adenocarcinoma and small cell -She is status post chemo in April of this year as well as radiation in August of this year.   -Now found to have new lesions in the liver concerning for metastatic process.   -Followed by Dr. [name] and notified via Epic as a courtesy.  LFTs noted to be elevated.   -We will hold her statin medication.   -LFTs trending down  Check coags in the morning and PT-INR is 14.0/1.1 -MRI brain done in August did not show any metastatic lesions.  Macrocytic Anemia -No evidence of bleeding.   -Globin/hematocrit is relatively stable at 10.8/33.1 -Anemia panel was done and reviewed and showed an iron level of 68, U IBC 147, TIBC of 215, saturation ratio 32%, ferritin level 296, folate level 20.9,  vitamin B12 724 Check anemia panel in the morning. Check TSH.  History of Vertigo -Uses Valium as needed.  Hypokalemia -Improved and resolved -Continue monitor and trend and replete as necessary -Repeat CMP in a.m.  Nonsevere (moderate) malnutrition in context of chronic illness -Nutritionist consulted for further evaluation recommendations and recommending boost breeze p.o. 3 times daily as well as Ensure Enlive p.o. twice  daily -C/w Soft Diet   GOC: DNR -Now made DNR after palliative care goals of care discussion  DVT prophylaxis: SCDs, Enoxaparin 40 mg sq q24h Code Status: DO NOT RESUSCITATE  Family Communication: No family present at bedside and Sister did not respond when called this afternoon Disposition Plan: Anticipating discharging home in next 24 to 48 hours  Status is: Inpatient  Remains inpatient appropriate because:IV treatments appropriate due to intensity of illness or inability to take PO and Inpatient level of care appropriate due to severity of illness   Dispo: The patient is from: Home              Anticipated d/c is to: Home              Anticipated d/c date is: 1 day              Patient currently is not medically stable to d/c.  Consultants:   Medical Oncology  Palliative Care Medicine    Procedures: None  Antimicrobials:  Anti-infectives (From admission, onward)   None        Subjective: Seen and examined at bedside and she did not have any abdominal pain and nausea and vomiting and improved.  No chest pain, NS or dizziness.  Diet was advanced from a clinical diet to soft diet.  No other concerns or points at this time and anticipating discharging soon as PT is recommended no follow-up.  Objective: Vitals:   09/21/20 1400 09/21/20 2131 09/22/20 0505 09/22/20 1407  BP: (!) 142/76 (!) 141/79 (!) 148/81 129/77  Pulse: 81 81 81 81  Resp: 16 16 18    Temp: 98.6 F (37 C) 98 F (36.7 C) 98.6 F (37 C) 98 F (36.7 C)  TempSrc: Oral Oral Oral Oral  SpO2: 99% 99% 98% 99%  Weight:      Height:        Intake/Output Summary (Last 24 hours) at 09/22/2020 1710 Last data filed at 09/22/2020 1406 Gross per 24 hour  Intake 2028.66 ml  Output 1850 ml  Net 178.66 ml   Filed Weights   09/20/20 2213  Weight: 72.5 kg   Examination: Physical Exam:  Constitutional: WN/WD overweight chronically ill-appearing Caucasian female currently in NAD and appears calm and  comfortable Eyes: Lids and conjunctivae normal, sclerae anicteric  ENMT: External Ears, Nose appear normal. Grossly normal hearing.  Neck: Appears normal, supple, no cervical masses, normal ROM, no appreciable thyromegaly: No JVD Respiratory: Diminished to auscultation bilaterally, no wheezing, rales, rhonchi or crackles. Normal respiratory effort and patient is not tachypenic. No accessory muscle use.  Cardiovascular: RRR, no murmurs / rubs / gallops. S1 and S2 auscultated. No extremity edema. 2+ pedal pulses. No carotid bruits.  Abdomen: Soft, non-tender, non-distended. No masses palpated. No appreciable hepatosplenomegaly. Bowel sounds positive.  GU: Deferred. Musculoskeletal: No clubbing / cyanosis of digits/nails. No joint deformity upper and lower extremities.  Skin: No rashes, lesions, ulcers on a limited skin evaluation. No induration; Warm and dry.  Neurologic: CN 2-12 grossly intact with no focal deficits.  Romberg sign and cerebellar reflexes not assessed.  Psychiatric: Slightly impaired judgment and insight. Alert and awake. Normal mood and appropriate affect.   Data Reviewed: I have personally reviewed following labs and imaging studies  CBC: Recent Labs  Lab 09/20/20 1320 09/21/20 0532 09/22/20 0522  WBC 9.0 7.4 6.8  HGB 11.6* 10.7* 10.8*  HCT 34.9* 32.7* 33.1*  MCV 102.3* 103.8* 105.1*  PLT 233 210 017   Basic Metabolic Panel: Recent Labs  Lab 09/20/20 1320 09/21/20 0532 09/22/20 0522  NA 139 137 136  K 4.0 3.4* 4.0  CL 105 106 105  CO2 22 21* 23  GLUCOSE 104* 117* 106*  BUN 15 11 <5*  CREATININE 0.92 0.84 0.89  CALCIUM 10.0 9.0 9.4  MG  --   --  1.8   GFR: Estimated Creatinine Clearance: 64.6 mL/min (by C-G formula based on SCr of 0.89 mg/dL). Liver Function Tests: Recent Labs  Lab 09/20/20 1320 09/21/20 0532 09/22/20 0522  AST 98* 95* 75*  ALT 69* 65* 62*  ALKPHOS 236* 204* 217*  BILITOT 0.8 0.6 0.6  PROT 7.1 6.6 6.3*  ALBUMIN 3.8 3.5 3.5    No results for input(s): LIPASE, AMYLASE in the last 168 hours. No results for input(s): AMMONIA in the last 168 hours. Coagulation Profile: Recent Labs  Lab 09/21/20 0532  INR 1.1   Cardiac Enzymes: No results for input(s): CKTOTAL, CKMB, CKMBINDEX, TROPONINI in the last 168 hours. BNP (last 3 results) No results for input(s): PROBNP in the last 8760 hours. HbA1C: No results for input(s): HGBA1C in the last 72 hours. CBG: No results for input(s): GLUCAP in the last 168 hours. Lipid Profile: No results for input(s): CHOL, HDL, LDLCALC, TRIG, CHOLHDL, LDLDIRECT in the last 72 hours. Thyroid Function Tests: Recent Labs    09/21/20 0532  TSH 4.408   Anemia Panel: Recent Labs    09/21/20 0532  VITAMINB12 724  FOLATE 20.9  FERRITIN 296  TIBC 215*  IRON 68  RETICCTPCT 1.0   Sepsis Labs: No results for input(s): PROCALCITON, LATICACIDVEN in the last 168 hours.  Recent Results (from the past 240 hour(s))  Respiratory Panel by RT PCR (Flu A&B, Covid) - Nasopharyngeal Swab     Status: None   Collection Time: 09/20/20  6:01 PM   Specimen: Nasopharyngeal Swab  Result Value Ref Range Status   SARS Coronavirus 2 by RT PCR NEGATIVE NEGATIVE Final    Comment: (NOTE) SARS-CoV-2 target nucleic acids are NOT DETECTED.  The SARS-CoV-2 RNA is generally detectable in upper respiratoy specimens during the acute phase of infection. The lowest concentration of SARS-CoV-2 viral copies this assay can detect is 131 copies/mL. A negative result does not preclude SARS-Cov-2 infection and should not be used as the sole basis for treatment or other patient management decisions. A negative result may occur with  improper specimen collection/handling, submission of specimen other than nasopharyngeal swab, presence of viral mutation(s) within the areas targeted by this assay, and inadequate number of viral copies (<131 copies/mL). A negative result must be combined with  clinical observations, patient history, and epidemiological information. The expected result is Negative.  Fact Sheet for Patients:  PinkCheek.be  Fact Sheet for Healthcare Providers:  GravelBags.it  This test is no t yet approved or cleared by the [place] FDA and  has been authorized for detection and/or diagnosis of SARS-CoV-2 by FDA under an Emergency Use Authorization (EUA). This EUA will remain  in effect (meaning this test can be used) for the duration of the COVID-19 declaration under Section  564(b)(1) of the Act, 21 U.S.C. section 360bbb-3(b)(1), unless the authorization is terminated or revoked sooner.     Influenza A by PCR NEGATIVE NEGATIVE Final   Influenza B by PCR NEGATIVE NEGATIVE Final    Comment: (NOTE) The Xpert Xpress SARS-CoV-2/FLU/RSV assay is intended as an aid in  the diagnosis of influenza from Nasopharyngeal swab specimens and  should not be used as a sole basis for treatment. Nasal washings and  aspirates are unacceptable for Xpert Xpress SARS-CoV-2/FLU/RSV  testing.  Fact Sheet for Patients: PinkCheek.be  Fact Sheet for Healthcare Providers: GravelBags.it  This test is not yet approved or cleared by the [place] FDA and  has been authorized for detection and/or diagnosis of SARS-CoV-2 by  FDA under an Emergency Use Authorization (EUA). This EUA will remain  in effect (meaning this test can be used) for the duration of the  Covid-19 declaration under Section 564(b)(1) of the Act, 21  U.S.C. section 360bbb-3(b)(1), unless the authorization is  terminated or revoked. Performed at [facility], [place] [address] [place], [place] [address]      RN Pressure Injury Documentation:     Estimated body mass index is 26.6 kg/m as calculated from the following:   Height as of this encounter: 5\' 5"  (1.651 m).    Weight as of this encounter: 72.5 kg.  Malnutrition Type:  Nutrition Problem: Moderate Malnutrition Etiology: chronic illness, cancer and cancer related treatments   Malnutrition Characteristics:  Signs/Symptoms: energy intake < or equal to 75% for > or equal to 1 month, percent weight loss   Nutrition Interventions:  Interventions: Ensure Enlive (each supplement provides 350kcal and 20 grams of protein), Boost Breeze   Radiology Studies: No results found.  Scheduled Meds: . enoxaparin (LOVENOX) injection  40 mg Subcutaneous Q24H  . escitalopram  20 mg Oral Q1200  . feeding supplement  1 Container Oral TID BM  . ketorolac  1 drop Right Eye QHS  . pantoprazole  40 mg Oral Daily   Continuous Infusions: . dextrose 5 % and 0.45% NaCl 75 mL/hr at 09/21/20 2011    LOS: 1 day    Kerney Elbe, DO [facility] PAGER is on [place]  If 7PM-7AM, please contact night-coverage [email or web address]

## 2020-09-22 NOTE — Progress Notes (Signed)
Received referral from [place].  Provided further support around this pt completing HCPOA and Living Will.     Documents notarized with witnesses.  Pt with original and copies.  Copy in pt chart on unit.  Scanned copy sent to ACP documents for inclusion in EMR

## 2020-09-22 NOTE — Consult Note (Signed)
Palliative care consult note  Reason for consult: Lung cancer with newly discovered metastatic lesions  Palliative care consult received.  Chart reviewed including personal review of pertinent labs and imaging.  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  She also has a history of COPD, anxiety and presented with lightheadedness, weakness, and 2 to 3-week history of nausea.  She was found on CT scan in the ED to have metastatic lesions in her liver.  Plan is for discharge with follow-up with Dr. Earlie Server on 11/22.  Palliative consulted for goals of care.  I met today with Andrea Kline and her sister, [name].  She reports that she lives alone and her hobbies in the past included making art out of metal.  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  I introduced palliative care as specialized medical care for people living with serious illness. It focuses on providing relief from the symptoms and stress of a serious illness. The goal is to improve quality of life for both the patient and the family.  We discussed clinical course as well as wishes moving forward in regard to advanced directives.  She understands that she has newly discovered metastatic disease that does not curable.  Unfortunately, she has not yet been able to discuss options for care with her oncologist and this will be important to help her determine what her next steps will be regarding her long-term goals of care.    We reviewed a MOST form and discussed how to develop plan of care to focus on continuing therapies that would maximize chance of being well enough to return home and limiting therapies not in line with this goal.  I discussed with Ms. Kulick and her sister regarding heroic  interventions at the end-of-life. There is agreement this would not be in line with prior expressed wishes for a natural death or be likely to lead to getting well enough to go back home.  She is in agreement with changing CODE STATUS to DO NOT RESUSCITATE.  Recommendations: -DNR/DNI -Her sister is her surrogate decision maker in the event she cannot make her own medical decisions outlining this prior to her discharge. -We completed a MOST form today. DNR, Limited additional interventions, IVF and ABX if indicated, no feeding tube. -Concepts of Hospice and Palliative Care were discussed.  She is open to hospice support in the future. She needs the opportunity to discuss treatment options and prognosis with Dr. Earlie Server prior to making further decisions about goals of care. -I will call and request follow-up by outpatient palliative care after she has the opportunity to meet with Dr. [name] on 11/22.  Her father worked with hospice and palliative care of [facility] (now part of [place]).  She would like to work with them as well.  I will call and ask for referral for outpatient palliative care through [facility].  Questions and concerns addressed.   PMT will continue to support holistically.  Total time: 60 minutes  Greater than 50%  of this time was spent counseling and coordinating care related to the above assessment and plan.  Micheline Rough, MD [place] Team [phone]

## 2020-09-23 ENCOUNTER — Other Ambulatory Visit: Payer: PPO

## 2020-09-23 LAB — CBC WITH DIFFERENTIAL/PLATELET
Abs Immature Granulocytes: 0.02 10*3/uL (ref 0.00–0.07)
Basophils Absolute: 0 10*3/uL (ref 0.0–0.1)
Basophils Relative: 0 %
Eosinophils Absolute: 0 10*3/uL (ref 0.0–0.5)
Eosinophils Relative: 1 %
HCT: 34 % — ABNORMAL LOW (ref 36.0–46.0)
Hemoglobin: 11.1 g/dL — ABNORMAL LOW (ref 12.0–15.0)
Immature Granulocytes: 0 %
Lymphocytes Relative: 14 %
Lymphs Abs: 0.8 10*3/uL (ref 0.7–4.0)
MCH: 34.3 pg — ABNORMAL HIGH (ref 26.0–34.0)
MCHC: 32.6 g/dL (ref 30.0–36.0)
MCV: 104.9 fL — ABNORMAL HIGH (ref 80.0–100.0)
Monocytes Absolute: 0.5 10*3/uL (ref 0.1–1.0)
Monocytes Relative: 9 %
Neutro Abs: 4.6 10*3/uL (ref 1.7–7.7)
Neutrophils Relative %: 76 %
Platelets: 215 10*3/uL (ref 150–400)
RBC: 3.24 MIL/uL — ABNORMAL LOW (ref 3.87–5.11)
RDW: 12.4 % (ref 11.5–15.5)
WBC: 6 10*3/uL (ref 4.0–10.5)
nRBC: 0 % (ref 0.0–0.2)

## 2020-09-23 LAB — COMPREHENSIVE METABOLIC PANEL
ALT: 65 U/L — ABNORMAL HIGH (ref 0–44)
AST: 79 U/L — ABNORMAL HIGH (ref 15–41)
Albumin: 3.5 g/dL (ref 3.5–5.0)
Alkaline Phosphatase: 226 U/L — ABNORMAL HIGH (ref 38–126)
Anion gap: 8 (ref 5–15)
BUN: <5 mg/dL — ABNORMAL LOW (ref 8–23)
CO2: 24 mmol/L (ref 22–32)
Calcium: 9.2 mg/dL (ref 8.9–10.3)
Chloride: 107 mmol/L (ref 98–111)
Creatinine, Ser: 0.98 mg/dL (ref 0.44–1.00)
GFR, Estimated: >60 mL/min (ref >60)
Glucose, Bld: 96 mg/dL (ref 70–99)
Potassium: 3.8 mmol/L (ref 3.5–5.1)
Sodium: 139 mmol/L (ref 135–145)
Total Bilirubin: 0.6 mg/dL (ref 0.3–1.2)
Total Protein: 6.3 g/dL — ABNORMAL LOW (ref 6.5–8.1)

## 2020-09-23 LAB — PHOSPHORUS: Phosphorus: 3.3 mg/dL (ref 2.5–4.6)

## 2020-09-23 LAB — MAGNESIUM: Magnesium: 2.2 mg/dL (ref 1.7–2.4)

## 2020-09-23 MED ORDER — POLYETHYLENE GLYCOL 3350 17 G PO PACK: 17.0000 g | PACK | Freq: Every day | ORAL | 0 refills | Status: AC | PRN

## 2020-09-23 MED ORDER — ENSURE ENLIVE PO LIQD: 237.0000 mL | Freq: Two times a day (BID) | ORAL | 12 refills | Status: AC

## 2020-09-23 MED ORDER — ONDANSETRON HCL 4 MG PO TABS: 4.0000 mg | ORAL_TABLET | Freq: Four times a day (QID) | ORAL | 0 refills | Status: DC | PRN

## 2020-09-23 MED ORDER — ENSURE ENLIVE PO LIQD: 237.0000 mL | Freq: Two times a day (BID) | ORAL | Status: DC

## 2020-09-23 NOTE — Progress Notes (Signed)
Patient and family member were given discharge instructions, and all questions were answered.  Patient was taken to main exit by wheelchair.

## 2020-09-23 NOTE — Progress Notes (Signed)
Physical Therapy Treatment Patient Details Name: Andrea Kline MRN: [id number] DOB: 1956/10/03 Today's Date: 09/23/2020    History of Present Illness Patient is a 64 year old female past medical history of adenocarcinoma lung as well as small cell cancer of the lung s/p chemo April radiation in August, COPD, anxiety and depression who comes in with 2 to 3-week history of weakness lightheadedness nausea. CT Abd showing new hypodense liver lesions.     PT Comments    Pt seen for therapy session to review HEP. Pt progressing mobility well and Mod I for transfers with no AD. She was able to follow cues for exercsises and demonstrated understanding. Will follow for therapy in acute setting, pt has no PT needs once discharged.    Follow Up Recommendations  No PT follow up     Equipment Recommendations  3in1 (PT)    Recommendations for Other Services       Precautions / Restrictions Precautions Precautions: None Restrictions Weight Bearing Restrictions: No    Mobility  Bed Mobility Overal bed mobility: Independent                Transfers Overall transfer level: Modified independent Equipment used: None Transfers: Sit to/from Stand Sit to Stand: Modified independent (Device/Increase time)         General transfer comment: Mod I for sit<>stand from recliner for exercise and to move to counter.   Ambulation/Gait                 Stairs             Wheelchair Mobility    Modified Rankin (Stroke Patients Only)       Balance Overall balance assessment: Mild deficits observed, not formally tested                                          Cognition Arousal/Alertness: Awake/alert Behavior During Therapy: WFL for tasks assessed/performed Overall Cognitive Status: Within Functional Limits for tasks assessed                                        Exercises Other Exercises Other Exercises: HEP review: 5 reps  sit<>stand no UE use for power up from recliner. 2 reps for hip exercises standing at counter (abduction, extension, marching), 3-5 reps heel raises at counter.    General Comments        Pertinent Vitals/Pain Pain Assessment: No/denies pain    Home Living                      Prior Function            PT Goals (current goals can now be found in the care plan section) Acute Rehab PT Goals Patient Stated Goal: have more energy PT Goal Formulation: With patient Time For Goal Achievement: 10/05/20 Potential to Achieve Goals: Good Progress towards PT goals: Progressing toward goals    Frequency    Min 3X/week      PT Plan Current plan remains appropriate    Co-evaluation              AM-PAC PT "6 Clicks" Mobility   Outcome Measure  Help needed turning from your back to your side while in a flat bed without using  bedrails?: None Help needed moving from lying on your back to sitting on the side of a flat bed without using bedrails?: None Help needed moving to and from a bed to a chair (including a wheelchair)?: None Help needed standing up from a chair using your arms (e.g., wheelchair or bedside chair)?: A Little Help needed to walk in hospital room?: A Little Help needed climbing 3-5 steps with a railing? : A Little 6 Click Score: 21    End of Session   Activity Tolerance: Patient tolerated treatment well Patient left: in chair;with call bell/phone within reach;with family/visitor present Nurse Communication: Mobility status PT Visit Diagnosis: Other abnormalities of gait and mobility (R26.89)     Time: 9179-1505 PT Time Calculation (min) (ACUTE ONLY): 10 min  Charges:  $Therapeutic Exercise: 8-22 mins                    Verner Mould, DPT Acute Rehabilitation Services  Office [phone] Pager [phone]  09/23/2020 4:11 PM

## 2020-09-23 NOTE — Plan of Care (Signed)

## 2020-09-23 NOTE — Progress Notes (Signed)
Pt IV fluid orders expired. Pt upset, stating she was dehydrated and did not understand why she did not need the fluids anymore. On call Sharlet Salina paged to see if fluids should be reordered. Sharlet Salina stated that pts dehydration is resolving and it is OK to keep pt at [facility]. New order for compazine given to help control nausea and encourage PO intake. Pt educated.

## 2020-09-23 NOTE — TOC Transition Note (Signed)
Transition of Care [facility]) - CM/SW Discharge Note   Patient Details  Name: Andrea Kline MRN: [id number] Date of Birth: 1956-01-28  [facility] CM/SW Contact:  Lennart Pall, LCSW Phone Number: 09/23/2020, 2:38 PM   Clinical Narrative:     Met with pt and sister today and both pleased with pt's physical and cognitive improvement over the past couple of days.  They were able to meet with Dr. Domingo Cocking yesterday and are agreed to have [place] services at home.  Pt is also much more receptive to assistance from her sister and already discussing services to come in to clean home.  They are grateful for the assistance with getting Advanced Directives completed while here as well.  Ready for dc today per MD.  Final next level of care: Home/Self Care (with Palliative Team following) Barriers to Discharge: Barriers Resolved   Patient Goals and CMS Choice Patient states their goals for this hospitalization and ongoing recovery are:: to return to her own home      Discharge Placement                       Discharge Plan and Services In-house Referral: Clinical Social Work              DME Arranged: N/A DME Agency: NA       HH Arranged: NA [place] Agency: NA        Social Determinants of Health (SDOH) Interventions     Readmission Risk Interventions Readmission Risk Prevention Plan 09/21/2020  Post Dischage Appt Complete  Medication Screening Complete  Transportation Screening Complete  Some recent data might be hidden

## 2020-09-23 NOTE — Progress Notes (Signed)
Occupational Therapy Treatment Patient Details Name: Andrea Kline MRN: [id number] DOB: [birth year] Today's Date: 09/23/2020    History of present illness Patient is a 64 year old female past medical history of adenocarcinoma lung as well as small cell cancer of the lung s/p chemo April radiation in August, COPD, anxiety and depression who comes in with 2 to 3-week history of weakness lightheadedness nausea. CT Abd showing new hypodense liver lesions.    OT comments  Treatment focused on improving activity tolerance. Patient independent to don underwear and stand at sink to perform grooming. Patient ambulated x 4 min in hall with cane, required 2 min sitting break and then able to stand at sink. Patient progressing towards goal. Recommended shower seat for safety at home. Cont POC to advance activity tolerance.   Follow Up Recommendations  No OT follow up    Equipment Recommendations  Tub/shower seat    Recommendations for Other Services      Precautions / Restrictions Precautions Precautions: None Restrictions Weight Bearing Restrictions: No       Mobility Bed Mobility Overal bed mobility: Independent                Transfers Overall transfer level: Modified independent Equipment used: Quad cane Transfers: Sit to/from Stand Sit to Stand: Modified independent (Device/Increase time)         General transfer comment: Mod I for standing and ambulating in room and in hallway with hurry cane. Ambulated in hall x 4 min. Reports knees feel weak after ambulation and needed 2 minute sitting break before standing at sink.    Balance Overall balance assessment: Mild deficits observed, not formally tested                                         ADL either performed or assessed with clinical judgement   ADL       Grooming: Independent;Standing Grooming Details (indicate cue type and reason): Stood at sink to perform oral care with independence. Stood at  sink x 3 minutes             Lower Body Dressing: Independent Lower Body Dressing Details (indicate cue type and reason): able to don underwear and pad independently                     Vision   Vision Assessment?: No apparent visual deficits   Perception     Praxis      Cognition   Behavior During Therapy: WFL for tasks assessed/performed Overall Cognitive Status: Within Functional Limits for tasks assessed                                          Exercises     Shoulder Instructions       General Comments      Pertinent Vitals/ Pain       Pain Assessment: No/denies pain  Home Living                                          Prior Functioning/Environment              Frequency  Min 2X/week  Progress Toward Goals  OT Goals(current goals can now be found in the care plan section)  Progress towards OT goals: Progressing toward goals  Acute Rehab OT Goals Patient Stated Goal: have more energy OT Goal Formulation: With patient Time For Goal Achievement: 10/05/20 Potential to Achieve Goals: Good  Plan Discharge plan remains appropriate    Co-evaluation                 AM-PAC OT "6 Clicks" Daily Activity     Outcome Measure   Help from another person eating meals?: None Help from another person taking care of personal grooming?: None Help from another person toileting, which includes using toliet, bedpan, or urinal?: None Help from another person bathing (including washing, rinsing, drying)?: None Help from another person to put on and taking off regular upper body clothing?: None Help from another person to put on and taking off regular lower body clothing?: None 6 Click Score: 24    End of Session Equipment Utilized During Treatment:  (cane)  OT Visit Diagnosis: Other abnormalities of gait and mobility (R26.89);Pain   Activity Tolerance Patient tolerated treatment well   Patient  Left in chair;with call bell/phone within reach   Nurse Communication Mobility status        Time: 3567-0141 OT Time Calculation (min): 17 min  Charges: OT General Charges $OT Visit: 1 Visit OT Treatments $Therapeutic Activity: 8-22 mins  Derl Barrow, OTR/L [facility]  Office [phone] Pager: [place] 09/23/2020, 12:23 PM

## 2020-09-23 NOTE — Progress Notes (Signed)
[facility] ([facility])  Hospital Liaison: RN note         Notified by TOC manager of patient/family request for [facility] Palliative services at home after discharge.                  [facility] Palliative team will follow up with patient after discharge.         Please call with any hospice or palliative related questions.         Thank you for this referral.         Mary Anne Robertson, RN, CCM  [facility] Hospital Liaison (listed on [facility] under Hospice[facility])    [phone]   

## 2020-09-23 NOTE — Discharge Summary (Signed)
Physician Discharge Summary  Andrea Kline YPP:[id number] DOB: [birth year] DOA: 09/20/2020  PCP: [name], MD  Admit date: 09/20/2020 Discharge date: 09/23/2020  Admitted From: Home Disposition: Home  Recommendations for Outpatient Follow-up:  1. Follow up with PCP in 1-2 weeks 2. Follow up with Palliative Care as an outpatient  3. Have repeat MRI on 09/24/20 and follow up with Medical Oncology on 09/27/20 4. Please obtain CMP/CBC, Mag, Phos in one week 5. Please follow up on the following pending results:  Home Health: No  Equipment/Devices: Shower Tub    Discharge Condition: Stable  CODE STATUS: DO NOT RESUSCITATE Diet recommendation: Soft   Brief/Interim Summary: HPI per Dr. 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 She has had few episodes of vomiting as well. Occasional diarrhea. Complains of upper abdominal pain which is significant only when somebody is pushing on it. Otherwise is about 3 out of 10 in intensity. Denies any radiation of the pain. Denies any blood in her emesis. Denies any fever or chills. Denies any passing out episodes although she hascome close to it. Has felt unsteady on her feet the last several days and has almost fallen down. Denies any injuries. Patient lives with herself. She saw her primary care physician recently who stopped her Celebrex and statin medication.   In the emergency department patient was noted to be fatigued, dehydrated. Noted to have transaminitis. She underwent CT scan of chest abdomen pelvis which raises concern for new metastatic lesions in her liver. Patient lives by herself. Unable to ambulate. Will be brought in  for further management.  **Interim History  PT OT recommending no follow-up and diet has been advanced and she has been tolerating this well.  Palliative care was consulted for goals of care discussion and this was done yesterday and MOST form was filled out.  Medical oncology recommending outpatient follow-up for further options and discussion and she will have repeat MRI done tomorrow as an outpatient.  She is stable to D/C home with outpatient follow up.  Discharge Diagnoses:  Active Problems:   FTT (failure to thrive) in adult   Nausea   Liver metastases (HCC)   Dehydration   Transaminitis   Macrocytic anemia   Malnutrition of moderate degree  Failure to thrive with dehydration and nausea and vomiting: -Symptoms likely due to metastatic process noted in her liver.  -Patient continues to have pain in the right upper quadrant.  -PT and OT evaluation recommending no follow up.  -Continue with IV fluids and advanced diet. She has had very poor oral intake due to her symptoms but will go to Soft Diet. Control her nausea.  -Patient lives by herself and is not considered safe to go back to being by herself at this time. She has had near falls but sister able to look after her -MOST form done as Palliative consulted for [place] discussion -LFTs are improving some and stabilized total cholesterol elevated -Nausea and Vomiting improved -Dehydration is improving and Labs are normalizing  -She tolerated a soft diet and can be discharged home as her nausea vomiting is resolved and she is taking nutritional supplements as well  History of lung cancer, both adenocarcinoma and small cell -She is status  post chemo in April of this year as well as radiation in August of this year.  -Now found to have new lesions in the liver concerning for metastatic process.  -Followed by Dr. [name] and notified via Epic as a courtesy. LFTs noted to be elevated.  -We will hold her statin medication.   -LFTs trending down Check coags in the morning and PT-INR is 14.0/1.1 -MRI brain done in August did not show any metastatic lesions; she will be getting a repeat MRI brain in outpatient setting tomorrow 09/24/2020 -She has a follow-up with medical oncology Dr. Earlie Server on 09/27/2020 for further discussion and prognosis  Macrocytic Anemia -No evidence of bleeding.  -Globin/hematocrit is relatively stable at 10.8/33.1 yesterday and today is 11.1/34.0 -Anemia panel was done and reviewed and showed an iron level of 68, U IBC 147, TIBC of 215, saturation ratio 32%, ferritin level 296, folate level 20.9, vitamin B12 724 -Continue to monitor for signs and symptoms of bleeding; currently no overt bleeding noted -Repeat CBC within 1 week  Abnormal LFTs -In the setting of metastasis to the liver -Patient history is trended down to 79, ALT is now 65 -Continue to monitor and trend in the outpatient setting  History of Vertigo -Uses Valium as needed.  Hypokalemia -Improved and resolved and potassium is now 3.8 -Continue monitor and trend and replete as necessary -Repeat CMP in a.m.  Nonsevere (moderate) malnutrition in context of chronic illness -Nutritionist consulted for further evaluation recommendations and recommending boost breeze p.o. 3 times daily as well as Ensure Enlive p.o. twice daily -C/w Soft Diet and follow-up as an outpatient  GOC: DNR -Now made DNR after palliative care goals of care discussion -Follow-up for further goals of care discussion and will need outpatient palliative care follow-up  Discharge Instructions  Discharge Instructions    Call MD for:  difficulty breathing, headache or visual disturbances   Complete by: As directed    Call MD for:  persistant nausea and vomiting   Complete by: As directed    Call MD for:  redness, tenderness, or signs of infection (pain, swelling, redness, odor or green/yellow discharge around incision site)   Complete by: As  directed    Call MD for:  severe uncontrolled pain   Complete by: As directed    Call MD for:  temperature >100.4   Complete by: As directed    Diet - low sodium heart healthy   Complete by: As directed    Discharge instructions   Complete by: As directed    You were cared for by a hospitalist during your hospital stay. If you have any questions about your discharge medications or the care you received while you were in the hospital after you are discharged, you can call the unit and ask to speak with the hospitalist on call if the hospitalist that took care of you is not available. Once you are discharged, your primary care physician will handle any further medical issues. Please note that NO REFILLS for any discharge medications will be authorized once you are discharged, as it is imperative that you return to your primary care physician (or establish a relationship with a primary care physician if you do not have one) for your aftercare needs so that they can reassess your need for medications and monitor your lab values.  Follow up with PCP and Medical Oncology. Take all medications as prescribed. If symptoms change or worsen please return to the ED for evaluation   Increase activity  slowly   Complete by: As directed      Allergies as of 09/23/2020      Reactions   Advair Diskus [fluticasone-salmeterol]    Can't breath/coughing   Codeine    Dizziness/nausea as a teenager      Medication List    STOP taking these medications   atorvastatin 10 MG tablet Commonly known as: LIPITOR     TAKE these medications   acetaminophen 500 MG tablet Commonly known as: TYLENOL Take 500-1,000 mg by mouth every 6 (six) hours as needed (pain.).   Besivance 0.6 % Susp Generic drug: Besifloxacin HCl Place 1 drop into the right eye 3 (three) times daily.   celecoxib 200 MG capsule Commonly known as: CELEBREX Take 200 mg by mouth daily at 12 noon.   diazepam 10 MG tablet Commonly known as:  VALIUM Take 5-10 mg by mouth daily as needed (vertigo).   Durezol 0.05 % Emul Generic drug: Difluprednate Place 1 drop into both eyes 3 (three) times daily as needed (dry eyes).   escitalopram 20 MG tablet Commonly known as: LEXAPRO Take 20 mg by mouth daily at 12 noon.   feeding supplement Liqd Take 237 mLs by mouth 2 (two) times daily between meals.   multivitamin with minerals Tabs tablet Take 1 tablet by mouth daily at 12 noon.   omeprazole 40 MG capsule Commonly known as: PRILOSEC Take 40 mg by mouth daily at 12 noon.   ondansetron 4 MG tablet Commonly known as: ZOFRAN Take 1 tablet (4 mg total) by mouth every 6 (six) hours as needed for nausea.   polyethylene glycol 17 g packet Commonly known as: MIRALAX / GLYCOLAX Take 17 g by mouth daily as needed for mild constipation.   ProAir HFA 108 (90 Base) MCG/ACT inhaler Generic drug: albuterol Inhale 2 puffs into the lungs every 6 (six) hours as needed for wheezing or shortness of breath.   Prolensa 0.07 % Soln Generic drug: Bromfenac Sodium Place 1 drop into both eyes daily as needed (dry eyes).   VISINE OP Apply 1 drop to eye 3 (three) times daily as needed (dry eyes).   Vitamin D-3 125 MCG (5000 UT) Tabs Take 5,000 Units by mouth daily at 12 noon.            Durable Medical Equipment  (From admission, onward)         Start     Ordered   09/23/20 1251  For home use only DME Shower stool  Once        09/23/20 1250          Allergies  Allergen Reactions  . Advair Diskus [Fluticasone-Salmeterol]     Can't breath/coughing  . Codeine     Dizziness/nausea as a teenager   Consultations:  Medical Oncology  Palliative Care Medicine  Procedures/Studies: CT Chest W Contrast  Result Date: 09/20/2020 CLINICAL DATA:  Metastatic left lung cancer, assess treatment response, abdominal pain, weakness, nausea, vomiting, diarrhea EXAM: CT CHEST, ABDOMEN, AND PELVIS WITH CONTRAST TECHNIQUE: Multidetector CT  imaging of the chest, abdomen and pelvis was performed following the standard protocol during bolus administration of intravenous contrast. CONTRAST:  152mL OMNIPAQUE IOHEXOL 300 MG/ML SOLN, additional oral enteric contrast COMPARISON:  PET-CT, 05/23/2020 FINDINGS: CT CHEST FINDINGS Cardiovascular: Aortic atherosclerosis. Normal heart size. Three-vessel coronary artery calcifications. No pericardial effusion. Mediastinum/Nodes: No enlarged mediastinal, hilar, or axillary lymph nodes. Small hiatal hernia. Previously noted enlarged prevascular lymph nodes are resolved, measuring no greater than 4-5 mm (  series 2, image 23). Thyroid gland, trachea, and esophagus demonstrate no significant findings. Lungs/Pleura: Mild, predominantly paraseptal emphysema. Redemonstrated postoperative findings of wedge resection of the lingula (series 4, image 74) and left lower lobe (series 4, image 100). No pleural effusion or pneumothorax. Musculoskeletal: No chest wall mass or suspicious bone lesions identified. CT ABDOMEN PELVIS FINDINGS Hepatobiliary: There are innumerable new, hypodense liver lesions of varying sizes, largest, index lesion of the peripheral right lobe of the liver measuring 4.2 x 3.3 cm (series 2, image 58). No gallstones, gallbladder wall thickening, or biliary dilatation. Pancreas: Unremarkable. No pancreatic ductal dilatation or surrounding inflammatory changes. Spleen: Normal in size without significant abnormality. Adrenals/Urinary Tract: Adrenal glands are unremarkable. Kidneys are normal, without renal calculi, solid lesion, or hydronephrosis. Thickening of the urinary bladder. Stomach/Bowel: Stomach is within normal limits. Appendix appears normal. No evidence of bowel wall thickening, distention, or inflammatory changes. Descending and sigmoid diverticulosis. Vascular/Lymphatic: Aortic atherosclerosis. No enlarged abdominal or pelvic lymph nodes. Reproductive: No mass or other abnormality. Other: No  abdominal wall hernia or abnormality. No abdominopelvic ascites. Musculoskeletal: No acute or significant osseous findings. IMPRESSION: 1. There are innumerable new, hypodense liver lesions of varying sizes, consistent with new hepatic metastatic disease. 2. Previously noted enlarged prevascular lymph nodes are resolved, measuring no greater than 4-5 mm, presumably reflecting response of metastatic lymph nodes to radiation therapy. 3. Redemonstrated postoperative findings of wedge resection of the lingula and left lower lobe. 4. Thickening of the urinary bladder, nonspecific but suggestive of infectious or inflammatory cystitis. Correlate with urinalysis. Aortic Atherosclerosis (ICD10-I70.0) and Emphysema (ICD10-J43.9). Electronically Signed   By: Eddie Candle M.D.   On: 09/20/2020 17:20   CT Abdomen Pelvis W Contrast  Result Date: 09/20/2020 CLINICAL DATA:  Metastatic left lung cancer, assess treatment response, abdominal pain, weakness, nausea, vomiting, diarrhea EXAM: CT CHEST, ABDOMEN, AND PELVIS WITH CONTRAST TECHNIQUE: Multidetector CT imaging of the chest, abdomen and pelvis was performed following the standard protocol during bolus administration of intravenous contrast. CONTRAST:  13mL OMNIPAQUE IOHEXOL 300 MG/ML SOLN, additional oral enteric contrast COMPARISON:  PET-CT, 05/23/2020 FINDINGS: CT CHEST FINDINGS Cardiovascular: Aortic atherosclerosis. Normal heart size. Three-vessel coronary artery calcifications. No pericardial effusion. Mediastinum/Nodes: No enlarged mediastinal, hilar, or axillary lymph nodes. Small hiatal hernia. Previously noted enlarged prevascular lymph nodes are resolved, measuring no greater than 4-5 mm (series 2, image 23). Thyroid gland, trachea, and esophagus demonstrate no significant findings. Lungs/Pleura: Mild, predominantly paraseptal emphysema. Redemonstrated postoperative findings of wedge resection of the lingula (series 4, image 74) and left lower lobe (series 4,  image 100). No pleural effusion or pneumothorax. Musculoskeletal: No chest wall mass or suspicious bone lesions identified. CT ABDOMEN PELVIS FINDINGS Hepatobiliary: There are innumerable new, hypodense liver lesions of varying sizes, largest, index lesion of the peripheral right lobe of the liver measuring 4.2 x 3.3 cm (series 2, image 58). No gallstones, gallbladder wall thickening, or biliary dilatation. Pancreas: Unremarkable. No pancreatic ductal dilatation or surrounding inflammatory changes. Spleen: Normal in size without significant abnormality. Adrenals/Urinary Tract: Adrenal glands are unremarkable. Kidneys are normal, without renal calculi, solid lesion, or hydronephrosis. Thickening of the urinary bladder. Stomach/Bowel: Stomach is within normal limits. Appendix appears normal. No evidence of bowel wall thickening, distention, or inflammatory changes. Descending and sigmoid diverticulosis. Vascular/Lymphatic: Aortic atherosclerosis. No enlarged abdominal or pelvic lymph nodes. Reproductive: No mass or other abnormality. Other: No abdominal wall hernia or abnormality. No abdominopelvic ascites. Musculoskeletal: No acute or significant osseous findings. IMPRESSION: 1. There are innumerable new,  hypodense liver lesions of varying sizes, consistent with new hepatic metastatic disease. 2. Previously noted enlarged prevascular lymph nodes are resolved, measuring no greater than 4-5 mm, presumably reflecting response of metastatic lymph nodes to radiation therapy. 3. Redemonstrated postoperative findings of wedge resection of the lingula and left lower lobe. 4. Thickening of the urinary bladder, nonspecific but suggestive of infectious or inflammatory cystitis. Correlate with urinalysis. Aortic Atherosclerosis (ICD10-I70.0) and Emphysema (ICD10-J43.9). Electronically Signed   By: Eddie Candle M.D.   On: 09/20/2020 17:20     Subjective: Seen and Examined at bedside and she is resting comfortably and able to  tolerate her soft diet without issues.  No nausea or vomiting.  Completing the halls without issues.  Stable for discharge patient will have her repeat MRI done in outpatient setting tomorrow and follow-up with Dr. Earlie Server next week.  She is stable and has no other complaints or concerns.  Discharge Exam: Vitals:   09/22/20 2226 09/23/20 0501  BP: (!) 150/86 109/72  Pulse: 82 78  Resp: 18 18  Temp: 98.2 F (36.8 C) 98.2 F (36.8 C)  SpO2: 98% 96%   Vitals:   09/22/20 0505 09/22/20 1407 09/22/20 2226 09/23/20 0501  BP: (!) 148/81 129/77 (!) 150/86 109/72  Pulse: 81 81 82 78  Resp: 18  18 18   Temp: 98.6 F (37 C) 98 F (36.7 C) 98.2 F (36.8 C) 98.2 F (36.8 C)  TempSrc: Oral Oral Oral Oral  SpO2: 98% 99% 98% 96%  Weight:      Height:       General: Pt is alert, awake, not in acute distress Cardiovascular: RRR, S1/S2 +, no rubs, no gallops Respiratory: CTA bilaterally, no wheezing, no rhonchi Abdominal: Soft, NT, Distended 2/2 body habitus, bowel sounds + Extremities: no edema, no cyanosis  The results of significant diagnostics from this hospitalization (including imaging, microbiology, ancillary and laboratory) are listed below for reference.    Microbiology: Recent Results (from the past 240 hour(s))  Respiratory Panel by RT PCR (Flu A&B, Covid) - Nasopharyngeal Swab     Status: None   Collection Time: 09/20/20  6:01 PM   Specimen: Nasopharyngeal Swab  Result Value Ref Range Status   SARS Coronavirus 2 by RT PCR NEGATIVE NEGATIVE Final    Comment: (NOTE) SARS-CoV-2 target nucleic acids are NOT DETECTED.  The SARS-CoV-2 RNA is generally detectable in upper respiratoy specimens during the acute phase of infection. The lowest concentration of SARS-CoV-2 viral copies this assay can detect is 131 copies/mL. A negative result does not preclude SARS-Cov-2 infection and should not be used as the sole basis for treatment or other patient management decisions. A negative  result may occur with  improper specimen collection/handling, submission of specimen other than nasopharyngeal swab, presence of viral mutation(s) within the areas targeted by this assay, and inadequate number of viral copies (<131 copies/mL). A negative result must be combined with clinical observations, patient history, and epidemiological information. The expected result is Negative.  Fact Sheet for Patients:  PinkCheek.be  Fact Sheet for Healthcare Providers:  GravelBags.it  This test is no t yet approved or cleared by the [place] FDA and  has been authorized for detection and/or diagnosis of SARS-CoV-2 by FDA under an Emergency Use Authorization (EUA). This EUA will remain  in effect (meaning this test can be used) for the duration of the COVID-19 declaration under Section 564(b)(1) of the Act, 21 U.S.C. section 360bbb-3(b)(1), unless the authorization is terminated or revoked sooner.  Influenza A by PCR NEGATIVE NEGATIVE Final   Influenza B by PCR NEGATIVE NEGATIVE Final    Comment: (NOTE) The Xpert Xpress SARS-CoV-2/FLU/RSV assay is intended as an aid in  the diagnosis of influenza from Nasopharyngeal swab specimens and  should not be used as a sole basis for treatment. Nasal washings and  aspirates are unacceptable for Xpert Xpress SARS-CoV-2/FLU/RSV  testing.  Fact Sheet for Patients: PinkCheek.be  Fact Sheet for Healthcare Providers: GravelBags.it  This test is not yet approved or cleared by the [place] FDA and  has been authorized for detection and/or diagnosis of SARS-CoV-2 by  FDA under an Emergency Use Authorization (EUA). This EUA will remain  in effect (meaning this test can be used) for the duration of the  Covid-19 declaration under Section 564(b)(1) of the Act, 21  U.S.C. section 360bbb-3(b)(1), unless the authorization is   terminated or revoked. Performed at [facility], [place] [address], [place], [place] [address]     Labs: BNP (last 3 results) No results for input(s): BNP in the last 8760 hours. Basic Metabolic Panel: Recent Labs  Lab 09/20/20 1320 09/21/20 0532 09/22/20 0522 09/23/20 0457  NA 139 137 136 139  K 4.0 3.4* 4.0 3.8  CL 105 106 105 107  CO2 22 21* 23 24  GLUCOSE 104* 117* 106* 96  BUN 15 11 <5* <5*  CREATININE 0.92 0.84 0.89 0.98  CALCIUM 10.0 9.0 9.4 9.2  MG  --   --  1.8 2.2  PHOS  --   --   --  3.3   Liver Function Tests: Recent Labs  Lab 09/20/20 1320 09/21/20 0532 09/22/20 0522 09/23/20 0457  AST 98* 95* 75* 79*  ALT 69* 65* 62* 65*  ALKPHOS 236* 204* 217* 226*  BILITOT 0.8 0.6 0.6 0.6  PROT 7.1 6.6 6.3* 6.3*  ALBUMIN 3.8 3.5 3.5 3.5   No results for input(s): LIPASE, AMYLASE in the last 168 hours. No results for input(s): AMMONIA in the last 168 hours. CBC: Recent Labs  Lab 09/20/20 1320 09/21/20 0532 09/22/20 0522 09/23/20 0457  WBC 9.0 7.4 6.8 6.0  NEUTROABS  --   --   --  4.6  HGB 11.6* 10.7* 10.8* 11.1*  HCT 34.9* 32.7* 33.1* 34.0*  MCV 102.3* 103.8* 105.1* 104.9*  PLT 233 210 205 215   Cardiac Enzymes: No results for input(s): CKTOTAL, CKMB, CKMBINDEX, TROPONINI in the last 168 hours. BNP: Invalid input(s): POCBNP CBG: No results for input(s): GLUCAP in the last 168 hours. D-Dimer No results for input(s): DDIMER in the last 72 hours. Hgb A1c No results for input(s): HGBA1C in the last 72 hours. Lipid Profile No results for input(s): CHOL, HDL, LDLCALC, TRIG, CHOLHDL, LDLDIRECT in the last 72 hours. Thyroid function studies Recent Labs    09/21/20 0532  TSH 4.408   Anemia work up Recent Labs    09/21/20 0532  VITAMINB12 724  FOLATE 20.9  FERRITIN 296  TIBC 215*  IRON 68  RETICCTPCT 1.0   Urinalysis    Component Value Date/Time   COLORURINE AMBER (A) 09/20/2020 1417   APPEARANCEUR HAZY (A) 09/20/2020  1417   LABSPEC 1.026 09/20/2020 1417   PHURINE 5.0 09/20/2020 1417   GLUCOSEU NEGATIVE 09/20/2020 1417   HGBUR NEGATIVE 09/20/2020 1417   BILIRUBINUR SMALL (A) 09/20/2020 1417   KETONESUR 20 (A) 09/20/2020 1417   PROTEINUR 30 (A) 09/20/2020 1417   UROBILINOGEN 0.2 04/06/2014 0201   NITRITE NEGATIVE 09/20/2020 1417  LEUKOCYTESUR NEGATIVE 09/20/2020 1417   Sepsis Labs Invalid input(s): PROCALCITONIN,  WBC,  LACTICIDVEN Microbiology Recent Results (from the past 240 hour(s))  Respiratory Panel by RT PCR (Flu A&B, Covid) - Nasopharyngeal Swab     Status: None   Collection Time: 09/20/20  6:01 PM   Specimen: Nasopharyngeal Swab  Result Value Ref Range Status   SARS Coronavirus 2 by RT PCR NEGATIVE NEGATIVE Final    Comment: (NOTE) SARS-CoV-2 target nucleic acids are NOT DETECTED.  The SARS-CoV-2 RNA is generally detectable in upper respiratoy specimens during the acute phase of infection. The lowest concentration of SARS-CoV-2 viral copies this assay can detect is 131 copies/mL. A negative result does not preclude SARS-Cov-2 infection and should not be used as the sole basis for treatment or other patient management decisions. A negative result may occur with  improper specimen collection/handling, submission of specimen other than nasopharyngeal swab, presence of viral mutation(s) within the areas targeted by this assay, and inadequate number of viral copies (<131 copies/mL). A negative result must be combined with clinical observations, patient history, and epidemiological information. The expected result is Negative.  Fact Sheet for Patients:  PinkCheek.be  Fact Sheet for Healthcare Providers:  GravelBags.it  This test is no t yet approved or cleared by the [place] FDA and  has been authorized for detection and/or diagnosis of SARS-CoV-2 by FDA under an Emergency Use Authorization (EUA). This EUA will remain   in effect (meaning this test can be used) for the duration of the COVID-19 declaration under Section 564(b)(1) of the Act, 21 U.S.C. section 360bbb-3(b)(1), unless the authorization is terminated or revoked sooner.     Influenza A by PCR NEGATIVE NEGATIVE Final   Influenza B by PCR NEGATIVE NEGATIVE Final    Comment: (NOTE) The Xpert Xpress SARS-CoV-2/FLU/RSV assay is intended as an aid in  the diagnosis of influenza from Nasopharyngeal swab specimens and  should not be used as a sole basis for treatment. Nasal washings and  aspirates are unacceptable for Xpert Xpress SARS-CoV-2/FLU/RSV  testing.  Fact Sheet for Patients: PinkCheek.be  Fact Sheet for Healthcare Providers: GravelBags.it  This test is not yet approved or cleared by the [place] FDA and  has been authorized for detection and/or diagnosis of SARS-CoV-2 by  FDA under an Emergency Use Authorization (EUA). This EUA will remain  in effect (meaning this test can be used) for the duration of the  Covid-19 declaration under Section 564(b)(1) of the Act, 21  U.S.C. section 360bbb-3(b)(1), unless the authorization is  terminated or revoked. Performed at [facility], [place] [address], [place], [place] [address]    Time coordinating discharge: 35 minutes  SIGNED:  Kerney Elbe, DO [facility] 09/23/2020, 1:05 PM Pager is on [place]  If 7PM-7AM, please contact night-coverage [email or web address]

## 2020-09-24 ENCOUNTER — Other Ambulatory Visit: Payer: PPO

## 2020-09-24 ENCOUNTER — Telehealth: Payer: Self-pay

## 2020-09-24 NOTE — Telephone Encounter (Signed)
Spoke with patient and have scheduled an In-person Consult for 09/28/20 @  3 PM.  COVID screening was negative. 2 cats in home. Patient's sister will be at consult appointment.   Consent obtained; updated Outlook/Netsmart/Team List and Epic.

## 2020-09-24 NOTE — Telephone Encounter (Signed)
Spoke with patient's sister Shavonna Corella. She states patient was just released from the hospital yesterday. Sister prefers appointment for Palliative care consult be scheduled after seeing Oncologist on 09/27/20. Spoke with Enid Derry NP to work patient in the week of 11/22. Per Enid Derry it is okay to work patient in on . Enid Derry will contact me back.

## 2020-09-27 ENCOUNTER — Telehealth: Payer: Self-pay

## 2020-09-27 ENCOUNTER — Inpatient Hospital Stay (HOSPITAL_BASED_OUTPATIENT_CLINIC_OR_DEPARTMENT_OTHER): Payer: PPO | Admitting: Internal Medicine

## 2020-09-27 ENCOUNTER — Other Ambulatory Visit: Payer: Self-pay

## 2020-09-27 ENCOUNTER — Encounter: Payer: Self-pay | Admitting: Internal Medicine

## 2020-09-27 VITALS — HR 106 | Temp 99.5°F | Resp 18 | Ht 65.0 in | Wt 156.1 lb

## 2020-09-27 DIAGNOSIS — C3492 Malignant neoplasm of unspecified part of left bronchus or lung: Secondary | ICD-10-CM

## 2020-09-27 DIAGNOSIS — Z87442 Personal history of urinary calculi: Secondary | ICD-10-CM | POA: Diagnosis not present

## 2020-09-27 DIAGNOSIS — C787 Secondary malignant neoplasm of liver and intrahepatic bile duct: Secondary | ICD-10-CM | POA: Diagnosis not present

## 2020-09-27 DIAGNOSIS — Z8719 Personal history of other diseases of the digestive system: Secondary | ICD-10-CM | POA: Diagnosis not present

## 2020-09-27 DIAGNOSIS — C771 Secondary and unspecified malignant neoplasm of intrathoracic lymph nodes: Secondary | ICD-10-CM

## 2020-09-27 DIAGNOSIS — Z885 Allergy status to narcotic agent status: Secondary | ICD-10-CM | POA: Diagnosis not present

## 2020-09-27 DIAGNOSIS — Z5111 Encounter for antineoplastic chemotherapy: Secondary | ICD-10-CM | POA: Diagnosis not present

## 2020-09-27 DIAGNOSIS — F419 Anxiety disorder, unspecified: Secondary | ICD-10-CM | POA: Diagnosis not present

## 2020-09-27 DIAGNOSIS — I700 Atherosclerosis of aorta: Secondary | ICD-10-CM | POA: Diagnosis not present

## 2020-09-27 DIAGNOSIS — I10 Essential (primary) hypertension: Secondary | ICD-10-CM | POA: Diagnosis not present

## 2020-09-27 DIAGNOSIS — J439 Emphysema, unspecified: Secondary | ICD-10-CM | POA: Diagnosis not present

## 2020-09-27 DIAGNOSIS — I2510 Atherosclerotic heart disease of native coronary artery without angina pectoris: Secondary | ICD-10-CM | POA: Diagnosis not present

## 2020-09-27 DIAGNOSIS — R112 Nausea with vomiting, unspecified: Secondary | ICD-10-CM | POA: Diagnosis not present

## 2020-09-27 DIAGNOSIS — Z79899 Other long term (current) drug therapy: Secondary | ICD-10-CM | POA: Diagnosis not present

## 2020-09-27 DIAGNOSIS — R0609 Other forms of dyspnea: Secondary | ICD-10-CM | POA: Diagnosis not present

## 2020-09-27 DIAGNOSIS — M1990 Unspecified osteoarthritis, unspecified site: Secondary | ICD-10-CM | POA: Diagnosis not present

## 2020-09-27 DIAGNOSIS — R42 Dizziness and giddiness: Secondary | ICD-10-CM | POA: Diagnosis not present

## 2020-09-27 DIAGNOSIS — C3432 Malignant neoplasm of lower lobe, left bronchus or lung: Secondary | ICD-10-CM | POA: Diagnosis not present

## 2020-09-27 MED ORDER — TRAMADOL HCL 50 MG PO TABS
50.0000 mg | ORAL_TABLET | Freq: Four times a day (QID) | ORAL | 0 refills | Status: AC | PRN
Start: 2020-09-27 — End: ?

## 2020-09-27 MED ORDER — ONDANSETRON HCL 8 MG PO TABS: 4.0000 mg | ORAL_TABLET | Freq: Three times a day (TID) | ORAL | 0 refills | Status: AC | PRN

## 2020-09-27 MED ORDER — DRONABINOL 2.5 MG PO CAPS: 2.5000 mg | ORAL_CAPSULE | Freq: Two times a day (BID) | ORAL | 0 refills | Status: DC

## 2020-09-27 NOTE — Telephone Encounter (Signed)
Pt seen today. Pt lives in [place]. I contacted [facility] and spoke with Horris Latino for a verbal referral. Dr. [name] to be the attending.
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
## 2020-09-27 NOTE — Telephone Encounter (Signed)
Received a email from after hours RN that patient was requesting a call back to R/S appointment on 09/28/20. Left patient a voicemail to return call.

## 2020-09-28 ENCOUNTER — Other Ambulatory Visit: Payer: Self-pay | Admitting: Internal Medicine

## 2020-09-29 ENCOUNTER — Ambulatory Visit: Payer: PPO | Admitting: Radiation Oncology

## 2020-09-29 DIAGNOSIS — C3490 Malignant neoplasm of unspecified part of unspecified bronchus or lung: Secondary | ICD-10-CM | POA: Diagnosis not present

## 2020-09-29 DIAGNOSIS — K219 Gastro-esophageal reflux disease without esophagitis: Secondary | ICD-10-CM | POA: Diagnosis not present

## 2020-09-29 DIAGNOSIS — E039 Hypothyroidism, unspecified: Secondary | ICD-10-CM | POA: Diagnosis not present

## 2020-09-29 DIAGNOSIS — E7800 Pure hypercholesterolemia, unspecified: Secondary | ICD-10-CM | POA: Diagnosis not present

## 2020-09-29 DIAGNOSIS — F325 Major depressive disorder, single episode, in full remission: Secondary | ICD-10-CM | POA: Diagnosis not present

## 2020-09-29 MED ORDER — DRONABINOL 2.5 MG PO CAPS: 2.5000 mg | ORAL_CAPSULE | Freq: Two times a day (BID) | ORAL | 0 refills | Status: DC

## 2020-09-29 NOTE — Addendum Note (Signed)
Addended by: Ardeen Garland on: 09/29/2020 01:30 PM   Modules accepted: Orders

## 2020-10-05 ENCOUNTER — Telehealth: Payer: Self-pay | Admitting: Medical Oncology

## 2020-10-05 ENCOUNTER — Other Ambulatory Visit: Payer: Self-pay | Admitting: Medical Oncology

## 2020-10-05 DIAGNOSIS — Z5111 Encounter for antineoplastic chemotherapy: Secondary | ICD-10-CM

## 2020-10-05 DIAGNOSIS — C787 Secondary malignant neoplasm of liver and intrahepatic bile duct: Secondary | ICD-10-CM

## 2020-10-05 DIAGNOSIS — C3492 Malignant neoplasm of unspecified part of left bronchus or lung: Secondary | ICD-10-CM

## 2020-10-05 DIAGNOSIS — C771 Secondary and unspecified malignant neoplasm of intrathoracic lymph nodes: Secondary | ICD-10-CM

## 2020-10-05 MED ORDER — DRONABINOL 2.5 MG PO CAPS: 2.5000 mg | ORAL_CAPSULE | Freq: Two times a day (BID) | ORAL | 0 refills | Status: AC

## 2020-10-05 NOTE — Telephone Encounter (Signed)
I called [facility] re Marinol request from a week ago . The pharmacist cannot tell me what the issue was because the rx was paid for today out of pocket.

## 2020-10-07 ENCOUNTER — Other Ambulatory Visit: Payer: PPO

## 2020-10-13 ENCOUNTER — Ambulatory Visit: Payer: Self-pay | Admitting: Radiation Oncology

## 2020-10-14 ENCOUNTER — Other Ambulatory Visit: Payer: Self-pay | Admitting: Internal Medicine

## 2020-10-25 DIAGNOSIS — C3490 Malignant neoplasm of unspecified part of unspecified bronchus or lung: Secondary | ICD-10-CM | POA: Diagnosis not present

## 2020-10-25 DIAGNOSIS — F325 Major depressive disorder, single episode, in full remission: Secondary | ICD-10-CM | POA: Diagnosis not present

## 2020-10-25 DIAGNOSIS — E7800 Pure hypercholesterolemia, unspecified: Secondary | ICD-10-CM | POA: Diagnosis not present

## 2020-10-25 DIAGNOSIS — E039 Hypothyroidism, unspecified: Secondary | ICD-10-CM | POA: Diagnosis not present

## 2020-10-25 DIAGNOSIS — K219 Gastro-esophageal reflux disease without esophagitis: Secondary | ICD-10-CM | POA: Diagnosis not present

## 2020-11-06 DEATH — deceased

## 2021-01-19 ENCOUNTER — Telehealth: Payer: Self-pay | Admitting: Medical Oncology

## 2021-01-19 NOTE — Telephone Encounter (Signed)
Returned Terry's call re incomplete from for pt care. I gave staff my number to call back .

## 2021-01-20 ENCOUNTER — Telehealth: Payer: Self-pay | Admitting: Medical Oncology

## 2021-01-20 NOTE — Telephone Encounter (Signed)
Faxed signed certification form back to Hospice.

## 2021-01-21 NOTE — Telephone Encounter (Signed)
Andrea Kline w/[facility] left a message to confirm she received this fax.
